# Patient Record
Sex: Female | Born: 1945
Health system: Southern US, Community
[De-identification: ages and names within clinical notes are randomized; demographics above are authoritative.]

## PROBLEM LIST (undated history)

## (undated) DIAGNOSIS — E785 Hyperlipidemia, unspecified: Secondary | ICD-10-CM

## (undated) DIAGNOSIS — R011 Cardiac murmur, unspecified: Secondary | ICD-10-CM

## (undated) DIAGNOSIS — K219 Gastro-esophageal reflux disease without esophagitis: Secondary | ICD-10-CM

## (undated) DIAGNOSIS — J45909 Unspecified asthma, uncomplicated: Secondary | ICD-10-CM

## (undated) DIAGNOSIS — T7840XA Allergy, unspecified, initial encounter: Secondary | ICD-10-CM

## (undated) DIAGNOSIS — R450 Nervousness: Secondary | ICD-10-CM

## (undated) DIAGNOSIS — I10 Essential (primary) hypertension: Secondary | ICD-10-CM

## (undated) DIAGNOSIS — Z9109 Other allergy status, other than to drugs and biological substances: Secondary | ICD-10-CM

## (undated) HISTORY — DX: Allergy, unspecified, initial encounter: T78.40XA

## (undated) HISTORY — DX: Cardiac murmur, unspecified: R01.1

## (undated) HISTORY — DX: Hyperlipidemia, unspecified: E78.5

## (undated) HISTORY — DX: Gastro-esophageal reflux disease without esophagitis: K21.9

## (undated) HISTORY — DX: Unspecified asthma, uncomplicated: J45.909

## (undated) HISTORY — DX: Essential (primary) hypertension: I10

## (undated) HISTORY — PX: ABDOMINAL HYSTERECTOMY: SHX81

## (undated) HISTORY — PX: TONSILLECTOMY: SUR1361

## (undated) HISTORY — DX: Other allergy status, other than to drugs and biological substances: Z91.09

## (undated) HISTORY — DX: Nervousness: R45.0

---

## 2000-04-13 ENCOUNTER — Encounter: Admission: RE | Admit: 2000-04-13 | Discharge: 2000-04-13 | Payer: Self-pay | Admitting: *Deleted

## 2000-04-13 ENCOUNTER — Encounter: Payer: Self-pay | Admitting: *Deleted

## 2000-08-09 ENCOUNTER — Ambulatory Visit (HOSPITAL_COMMUNITY): Admission: RE | Admit: 2000-08-09 | Discharge: 2000-08-09 | Payer: Self-pay | Admitting: *Deleted

## 2001-04-17 ENCOUNTER — Encounter: Payer: Self-pay | Admitting: *Deleted

## 2001-04-17 ENCOUNTER — Encounter: Admission: RE | Admit: 2001-04-17 | Discharge: 2001-04-17 | Payer: Self-pay | Admitting: *Deleted

## 2002-04-18 ENCOUNTER — Encounter: Payer: Self-pay | Admitting: *Deleted

## 2002-04-18 ENCOUNTER — Encounter: Admission: RE | Admit: 2002-04-18 | Discharge: 2002-04-18 | Payer: Self-pay | Admitting: *Deleted

## 2003-03-31 ENCOUNTER — Ambulatory Visit (HOSPITAL_COMMUNITY): Admission: RE | Admit: 2003-03-31 | Discharge: 2003-03-31 | Payer: Self-pay | Admitting: Gastroenterology

## 2003-04-21 ENCOUNTER — Encounter: Payer: Self-pay | Admitting: *Deleted

## 2003-04-21 ENCOUNTER — Encounter: Admission: RE | Admit: 2003-04-21 | Discharge: 2003-04-21 | Payer: Self-pay | Admitting: *Deleted

## 2003-07-07 ENCOUNTER — Other Ambulatory Visit: Admission: RE | Admit: 2003-07-07 | Discharge: 2003-07-07 | Payer: Self-pay | Admitting: Gynecology

## 2004-04-26 ENCOUNTER — Encounter: Admission: RE | Admit: 2004-04-26 | Discharge: 2004-04-26 | Payer: Self-pay | Admitting: Pediatric Nephrology

## 2005-05-26 ENCOUNTER — Encounter: Admission: RE | Admit: 2005-05-26 | Discharge: 2005-05-26 | Payer: Self-pay | Admitting: *Deleted

## 2005-12-08 ENCOUNTER — Ambulatory Visit (HOSPITAL_BASED_OUTPATIENT_CLINIC_OR_DEPARTMENT_OTHER): Admission: RE | Admit: 2005-12-08 | Discharge: 2005-12-08 | Payer: Self-pay | Admitting: Orthopedic Surgery

## 2006-05-29 ENCOUNTER — Encounter: Admission: RE | Admit: 2006-05-29 | Discharge: 2006-05-29 | Payer: Self-pay | Admitting: *Deleted

## 2007-06-05 ENCOUNTER — Encounter: Admission: RE | Admit: 2007-06-05 | Discharge: 2007-06-05 | Payer: Self-pay | Admitting: Family Medicine

## 2007-06-07 ENCOUNTER — Encounter: Admission: RE | Admit: 2007-06-07 | Discharge: 2007-06-07 | Payer: Self-pay | Admitting: Family Medicine

## 2007-12-27 ENCOUNTER — Encounter: Admission: RE | Admit: 2007-12-27 | Discharge: 2007-12-27 | Payer: Self-pay | Admitting: Family Medicine

## 2008-06-05 ENCOUNTER — Encounter: Admission: RE | Admit: 2008-06-05 | Discharge: 2008-06-05 | Payer: Self-pay | Admitting: Family Medicine

## 2008-07-08 ENCOUNTER — Other Ambulatory Visit: Admission: RE | Admit: 2008-07-08 | Discharge: 2008-07-08 | Payer: Self-pay | Admitting: Family Medicine

## 2008-11-21 ENCOUNTER — Ambulatory Visit (HOSPITAL_COMMUNITY): Admission: RE | Admit: 2008-11-21 | Discharge: 2008-11-21 | Payer: Self-pay | Admitting: Family Medicine

## 2008-12-01 ENCOUNTER — Encounter: Admission: RE | Admit: 2008-12-01 | Discharge: 2008-12-01 | Payer: Self-pay | Admitting: Gastroenterology

## 2009-06-08 ENCOUNTER — Encounter: Admission: RE | Admit: 2009-06-08 | Discharge: 2009-06-08 | Payer: Self-pay | Admitting: Family Medicine

## 2009-06-09 ENCOUNTER — Encounter: Admission: RE | Admit: 2009-06-09 | Discharge: 2009-06-09 | Payer: Self-pay | Admitting: Gastroenterology

## 2010-06-09 ENCOUNTER — Encounter: Admission: RE | Admit: 2010-06-09 | Discharge: 2010-06-09 | Payer: Self-pay | Admitting: Family Medicine

## 2010-11-28 ENCOUNTER — Encounter: Payer: Self-pay | Admitting: Family Medicine

## 2010-11-29 ENCOUNTER — Encounter: Payer: Self-pay | Admitting: Family Medicine

## 2010-12-14 ENCOUNTER — Other Ambulatory Visit: Payer: Self-pay | Admitting: Family Medicine

## 2010-12-14 DIAGNOSIS — R2681 Unsteadiness on feet: Secondary | ICD-10-CM

## 2010-12-23 ENCOUNTER — Ambulatory Visit
Admission: RE | Admit: 2010-12-23 | Discharge: 2010-12-23 | Disposition: A | Discharge status: Home / Self Care | Payer: 59 | Source: Ambulatory Visit | Attending: Family Medicine | Admitting: Family Medicine

## 2010-12-23 DIAGNOSIS — R2681 Unsteadiness on feet: Secondary | ICD-10-CM

## 2011-03-25 NOTE — Op Note (Signed)
NAMEMARRI, MCNEFF             ACCOUNT NO.:  [id number]   MEDICAL RECORD NO.:  [id number]          PATIENT TYPE:  AMB   LOCATION:  DSC                          FACILITY:  [facility]   PHYSICIAN:  Katy Fitch. Sypher, M.D. DATE OF BIRTH:  [birth year]   DATE OF PROCEDURE:  12/08/2005  DATE OF DISCHARGE:                                 OPERATIVE REPORT   PREOPERATIVE DIAGNOSIS:  Recurrent mucous cyst formation, ulnar aspect of  left index finger distal interphalangeal joint, status post prior  debridement by Dr. Dorinda Hill, attending dermatologist.   POSTOPERATIVE DIAGNOSIS:  Recurrent mucous cyst formation, ulnar aspect of  left index finger distal interphalangeal joint, status post prior  debridement by Dr. Dorinda Hill, attending dermatologist.   OPERATION:  Debridement of mucous cyst, left index finger distal  interphalangeal joint, with synovectomy and irrigation of left index finger  distal interphalangeal joint.   OPERATING SURGEON:  Katy Fitch. Sypher, M.D.   ASSISTANT:  Molly Maduro Dasnoit, P.A.-C.   ANESTHESIA:  0.25% Marcaine and 2% lidocaine metacarpal head level block of  left index finger supplemented by IV sedation.   SUPERVISING ANESTHESIOLOGIST:  Bedelia Person, M.D.   INDICATIONS:  Noelia Lenart is a 65 year old woman referred through the  courtesy of Dr. Dorinda Hill, attending dermatologist, for evaluation of  chronic recurrent mucous cyst involving her left index finger.   Ms. Glazer has background osteoarthritis with Heberden's nodes affecting  multiple distal interphalangeal joints.   In the spring of 2006, developed an unsightly and uncomfortable mucous cyst.  Dr. Mayford Knife attempted debridement under local anesthesia in his office.   Ms. Ravenscroft had excavation of the cyst followed by delayed primary healing.  For several months, the cyst remained absent; however, she now has developed  a recurrent bubbling area of mucoid fluid on the ulnar dorsal aspect of  her  left index finger DIP joint adjacent to osteophytes at the distal ulnar  aspect of the middle phalangeal condyle and the base of the distal phalanx.   Dr. Mayford Knife recommended a Hand Surgery consult.   After informed consent, Ms. Penado is brought to the operating room at this  time.  Preoperatively, she was counseled that the underlying pathology was  due to the presence of osteoarthritis in her distal interphalangeal joint.  This is evidenced by the development of marginal osteophytes and joint space  narrowing on her x-ray.   While most mucous cysts wall respond to debridement, we cannot guarantee  that they will remain absent, given progressive arthritis conditions.   We have advised her to attempt debridement of the visible cyst as well as  capsulectomy and synovectomy with debridement of the DIP joints.  Typically,  we will remove the marginal osteophytes on the adjacent surfaces of the  middle and distal phalanx and this art has proven successful the majority of  times in the past.   Questions were invited and answered preoperatively.   PROCEDURE:  Macayla Ekdahl was brought to the operating room and placed in  a supine position upon the operating table.   Following light sedation, the left arm was prepped with  Betadine soap and  solution and sterilely draped.  0.25% Marcaine and 2% lidocaine were  infiltrated in the metacarpal head level to obtain a digital block.  After  10 minutes, anesthesia was complete.   The left index finger was exsanguinated with a gauze wrap and a quarter-inch  Penrose drain placed at the proximal phalangeal level as a digital  tourniquet.   Ms. Escamilla cyst was explored through a curvilinear incision that was  ultimately expanded to a Mercedes incision, as there appeared to be a stalk  heading proximally from the visible cyst.   All of the mucin-containing tissues were debrided with a fine rongeur  followed by entry into the DIP joint  between the terminal extensor tendon  slip and the ulnar collateral ligament.  The capsule was excised followed by  use of a Halle-Martini microcurette to remove the adjacent osteophytes.   The hyaline and articular cartilage surfaces of the ulnar condyle of the  middle phalanx were noted to be pot-marked with areas of significant  degenerative change.   A dental needle was used within the joint to provide brisk irrigation under  pressure.  After approximately 40 mL of saline were used to irrigate the  joint, the mucinous areas of the dermis were debrided with a rongeur  followed by closure of the wound with mattress suture of 5-0 nylon.   The wound was dressed with Xeroflo, sterile gauze and Coban.   There were no apparent complications.   Ms. Kruschke tolerated this procedure in satisfactory condition.  She was  transferred to the recovery room with stable signs..   She will be discharged with prescriptions for Vicodin 5 mg one p.o. q.4-6 h.  p.r.n. pain, 20 tablets without refill, also Keflex 500 mg one p.o. q.8 h.  x4 days as a prophylactic antibiotic.      Katy Fitch Sypher, M.D.  Electronically Signed     RVS/MEDQ  D:  12/08/2005  T:  12/08/2005  Job:  [id number]   cc:   Dollene Cleveland, M.D.  Fax: [phone]

## 2011-03-25 NOTE — Procedures (Signed)
. [facility]  Patient:    Melanie Evans, NAM                    MRN: [id number] Proc. Date: 08/09/00 Adm. Date:  [id number] Attending:  Mingo Amber CC:         Al Decant. Janey Greaser, M.D.   Procedure Report  PROCEDURE:  Video upper endoscopy.  INDICATIONS:  A 65 year old female with clinical diagnosis of worsening reflux, with reflux laryngitis.  PREPARATION:  She is NPO since midnight.  PREPROCEDURE SEDATION:  She received 40 mg of Demerol and 6 mg of Versed. Despite this, she became extremely agitated and combative during the procedure.  DESCRIPTION OF PROCEDURE:  The Olympus video upper endoscope was inserted via the mouth and advanced through the upper esophageal sphincter with ease. Intubation was then carried out through the descending duodenum.  On withdrawal the mucosa was carefully evaluated.  The descending duodenum and bulb appeared normal, as did the entire stomach and retroflexed view of the GE junction.  On withdrawal through the  GE junction, the Z-line was sharp and occurred at 40 cm.  There was no obvious hiatal hernia, and there were no signs of esophagitis.  The patient tolerated the procedure well in terms of stable vital signs, despite her agitation.  No biopsies were taken.  There were no noted complications.  She will be observed in recovery for one hour and discharged with a benign abdomen.  IMPRESSION:  Normal upper endoscopy, in the face of clinical reflux.  PLAN:  Continued Nexium 40 mg q.a.m., Reglan 10 mg before large meals and at bedtime.   She is to return to the office in three weeks for reassessment. DD:  08/09/00 TD:  08/09/00 Job: [id number]

## 2011-03-25 NOTE — Op Note (Signed)
   NAME:  Melanie Evans, Melanie Evans                       ACCOUNT NO.:  [id number]   MEDICAL RECORD NO.:  [id number]                   PATIENT TYPE:  AMB   LOCATION:  ENDO                                 FACILITY:  [facility]   PHYSICIAN:  John C. Madilyn Fireman, M.D.                 DATE OF BIRTH:  1946-03-10   DATE OF PROCEDURE:  03/31/2003  DATE OF DISCHARGE:                                 OPERATIVE REPORT   PROCEDURE:  Colonoscopy.   INDICATIONS FOR PROCEDURE:  Colon cancer screening in a 65 year old patient  with no previous recent colon screening.   DESCRIPTION OF PROCEDURE:  The patient was placed in the left lateral  decubitus position then placed on the pulse monitor with continuous low flow  oxygen delivered by nasal cannula. She was sedated with 50 mcg IV fentanyl  and 5 mg IV Versed in addition to the medicine given for the previous EGD.  The Olympus video colonoscope was inserted into the rectum and advanced to  the cecum, confirmed by transillumination at McBurney's point and  visualization of the ileocecal valve and appendiceal orifice. The prep was  good but the colon was somewhat tortuous and redundant and somewhat  difficult to reach. The cecum, ascending, transverse, descending and sigmoid  colon all appeared normal with no masses, polyps, diverticula or other  mucosal abnormalities. The rectum likewise appeared normal and retroflexed  view of the anus revealed no obvious internal hemorrhoids. The colonoscope  was then withdrawn and the patient returned to the recovery room in stable  condition. The patient tolerated the procedure well and there were no  immediate complications.   IMPRESSION:  Basically normal colonoscopy.   PLAN:  Next colon cancer screening/sigmoidoscopy in five years.                                               John C. Madilyn Fireman, M.D.    JCH/MEDQ  D:  03/31/2003  T:  03/31/2003  Job:  [id number]   cc:   Al Decant. Janey Greaser, M.D.  [address]  [place]  [place] [address]  Fax: [phone]

## 2011-03-25 NOTE — Op Note (Signed)
   NAME:  Melanie Evans, Melanie Evans                       ACCOUNT NO.:  [id number]   MEDICAL RECORD NO.:  [id number]                   PATIENT TYPE:  AMB   LOCATION:  ENDO                                 FACILITY:  [facility]   PHYSICIAN:  John C. Madilyn Fireman, M.D.                 DATE OF BIRTH:  07-18-1946   DATE OF PROCEDURE:  03/31/2003  DATE OF DISCHARGE:                                 OPERATIVE REPORT   PROCEDURE:  Esophagogastroduodenoscopy with esophageal dilatation.   INDICATIONS FOR PROCEDURE:  Intermittent solid food dysphagia in a patient  also undergoing screening colonoscopy today.   DESCRIPTION OF PROCEDURE:  The patient was placed in the left lateral  decubitus position then placed on the pulse monitor with continuous low flow  oxygen delivered by nasal cannula. She was sedated with 62.5 mcg IV fentanyl  and 6 mg IV Versed. The Olympus video endoscope was advanced under direct  vision into the oropharynx and esophagus. The esophagus was straight and of  normal caliber with the squamocolumnar line at 38 cm. There was a  questionable subtle lower esophageal ring which was not prominent and there  was on resistance to passage of the scope beyond it. The stomach was entered  and a small amount of liquid secretions were suctioned from the fundus.  Retroflexed view of the cardia was unremarkable. The fundus, body, antrum  and pylorus all appeared normal. The duodenum was entered and both the bulb  and second portion were well inspected and appeared to be within normal  limits. A Savary guidewire was passed through the endoscope channel and the  scope withdrawn. A single 16 mm Savary dilator was passed over the guidewire  with mild to moderate resistance and removed together with the wire and no  blood was seen on withdrawal of the dilator. The patient was the prepared  for previously scheduled colonoscopy. She tolerated the procedure well and  there were no immediate complications.   IMPRESSION:  Questionable subtle lower esophageal ring or stricture.   PLAN:  Advance diet and observe response to dilatation.                                               John C. Madilyn Fireman, M.D.    JCH/MEDQ  D:  03/31/2003  T:  03/31/2003  Job:  [id number]   cc:   Al Decant. Janey Greaser, M.D.  [address]  [place]  [place] [address]  Fax: [phone]

## 2011-04-01 ENCOUNTER — Ambulatory Visit (HOSPITAL_BASED_OUTPATIENT_CLINIC_OR_DEPARTMENT_OTHER)
Admission: RE | Admit: 2011-04-01 | Discharge: 2011-04-01 | Disposition: A | Discharge status: Home / Self Care | Payer: 59 | Source: Ambulatory Visit | Attending: Orthopedic Surgery | Admitting: Orthopedic Surgery

## 2011-04-01 DIAGNOSIS — M24049 Loose body in unspecified finger joint(s): Secondary | ICD-10-CM | POA: Insufficient documentation

## 2011-04-01 DIAGNOSIS — M19049 Primary osteoarthritis, unspecified hand: Secondary | ICD-10-CM | POA: Insufficient documentation

## 2011-04-01 DIAGNOSIS — M6740 Ganglion, unspecified site: Secondary | ICD-10-CM | POA: Insufficient documentation

## 2011-04-07 NOTE — Op Note (Signed)
Melanie Evans, Melanie Evans             ACCOUNT NO.:  [id number]  MEDICAL RECORD NO.:  [id number]            PATIENT TYPE:  LOCATION:                                 FACILITY:  PHYSICIAN:  Melanie Evans, M.D.      DATE OF BIRTH:  DATE OF PROCEDURE:  04/01/2011 DATE OF DISCHARGE:                              OPERATIVE REPORT   PREOPERATIVE DIAGNOSIS:  Painful right long finger distal interphalangeal joint with mucoid cyst dorsal ulnar aspect of distal interphalangeal joint capsule with bone-on-bone arthropathy on plain x- rays.  POSTOPERATIVE DIAGNOSIS:  Painful right long finger distal interphalangeal joint with mucoid cyst dorsal ulnar aspect of distal interphalangeal joint capsule with bone-on-bone arthropathy on plain x- rays.  OPERATION:  Debridement of right long finger DIP joint through dorsoradial and dorsoulnar incisions with removal of multiple loose bodies and synovectomy.  OPERATING SURGEON:  Melanie Fitch. Brenn Deziel, MD  ASSISTANT:  Marveen Reeks Dasnoit, PA  ANESTHESIA:  Lidocaine 2% metacarpal head level block of right long finger.  SUPERVISING ANESTHESIOLOGIST/ANESTHETIST:  Melanie Fitch. Abdulwahab Demelo, MD, this a minor operating room procedure.  INDICATION:  Melanie Evans is a 64 year old woman referred through the courtesy of Dr. Dorinda Hill, dermatologist, for evaluation and management of recurrent mucoid cyst.  Five years ago, she was treated for a cyst involving her left hand.  She has had good long-term result. She returned requesting debridement of her right long finger DIP joint where she had a painful enlarging mucoid cyst.  X-rays obtained preoperatively demonstrated bone-on-bone arthropathy with the appearance of probable multiple loose bodies.  After informed consent, she was brought to the operating room at this time.  Melanie Evans was brought to room one of the [facility] and placed in supine position on the operating table.  Following a  routine surgical time-out, the right arm was prepped with Betadine soap and solution and sterilely draped.  A Penrose drain will be used as a digital tourniquet.  Following sterile drape application, the right long finger was exsanguinated with the gauze wrap and a 1/2-inch Penrose drain placed on the proximal phalangeal segment as a digital tourniquet.  Procedure commenced with curvilinear incisions on the dorsoradial and dorsoulnar aspect of the finger.  The capsule between the terminal extensor tendon slip and the radial collateral ligament and ulnar collateral ligament was resected followed by thorough synovectomy of the dorsal aspect of the DIP joint.  More than four loose bodies were removed as well as extensive inflammatory synovium.  The marginal osteophytes at the base of the distal phalanx were resected with a microrongeur.  The wound was then irrigated with a 19-gauge blunt dental needle.  The wound was inspected for hemostasis followed by repair of the incisions with trauma sutures of 5-0 nylon.  There were no apparent complications.  For aftercare, Melanie Evans was provided prescriptions for Vicodin 5 mg one p.o. q.4-6 h. p.r.n. pain, 20 tablets without refill, also doxycycline 100 mg p.o. q.12 h. x4 days as prophylactic antibiotic.     Melanie Fitch Treasa Bradshaw, M.D.     RVS/MEDQ  D:  04/01/2011  T:  04/01/2011  Job:  [id number]  Electronically Signed by Josephine Igo M.D. on 04/07/2011 02:34:38 PM

## 2011-04-14 ENCOUNTER — Other Ambulatory Visit: Payer: Self-pay | Admitting: Family Medicine

## 2011-04-14 DIAGNOSIS — Z1231 Encounter for screening mammogram for malignant neoplasm of breast: Secondary | ICD-10-CM

## 2011-06-13 ENCOUNTER — Ambulatory Visit
Admission: RE | Admit: 2011-06-13 | Discharge: 2011-06-13 | Disposition: A | Discharge status: Home / Self Care | Payer: 59 | Source: Ambulatory Visit | Attending: Family Medicine | Admitting: Family Medicine

## 2011-06-13 DIAGNOSIS — Z1231 Encounter for screening mammogram for malignant neoplasm of breast: Secondary | ICD-10-CM

## 2011-12-13 ENCOUNTER — Other Ambulatory Visit: Payer: Self-pay | Admitting: Gynecology

## 2011-12-13 DIAGNOSIS — N63 Unspecified lump in unspecified breast: Secondary | ICD-10-CM

## 2011-12-13 DIAGNOSIS — N644 Mastodynia: Secondary | ICD-10-CM

## 2011-12-20 ENCOUNTER — Ambulatory Visit
Admission: RE | Admit: 2011-12-20 | Discharge: 2011-12-20 | Disposition: A | Discharge status: Home / Self Care | Payer: 59 | Source: Ambulatory Visit | Attending: Gynecology | Admitting: Gynecology

## 2011-12-20 DIAGNOSIS — N644 Mastodynia: Secondary | ICD-10-CM

## 2011-12-20 DIAGNOSIS — N63 Unspecified lump in unspecified breast: Secondary | ICD-10-CM

## 2012-10-15 ENCOUNTER — Encounter: Payer: Self-pay | Admitting: Internal Medicine

## 2012-10-15 ENCOUNTER — Ambulatory Visit (INDEPENDENT_AMBULATORY_CARE_PROVIDER_SITE_OTHER): Payer: 59 | Admitting: Internal Medicine

## 2012-10-15 VITALS — BP 130/78 | HR 74 | Ht 67.0 in | Wt 172.8 lb

## 2012-10-15 DIAGNOSIS — F5104 Psychophysiologic insomnia: Secondary | ICD-10-CM

## 2012-10-15 DIAGNOSIS — J309 Allergic rhinitis, unspecified: Secondary | ICD-10-CM

## 2012-10-15 DIAGNOSIS — G4700 Insomnia, unspecified: Secondary | ICD-10-CM

## 2012-10-15 MED ORDER — ESZOPICLONE 2 MG PO TABS: 2.0000 mg | ORAL_TABLET | Freq: Every day | ORAL | Status: DC

## 2012-10-15 NOTE — Patient Instructions (Addendum)
Most people sleep best in a cool, quiet, dark room  Try minimizing antihistamines near bedtime, since they seem stimulating to you. You can take the Xyzal in th morning.  Instead of Astelin, try otc Nasalcrom/ Cromol nose spray   Order-  [facility] refer to Behavioral Health for insomnia  Samples Lunesta 2 mg      Try 1 each night at bedtime instead of temazepam  Script to hold if helpfu  Consider looking for a book-  "I Can't Sleep"  By [employer]

## 2012-10-15 NOTE — Progress Notes (Signed)
10/15/12- 37 yoF former smoker referred by Dr Tenny Craw- Insomnia-  Dr Tenny Craw; wants to come off of Temazepam if possible She dates onset of sleep problems to 6 years ago when she fell, fracturing left fingers. She developed reflex sympathetic dystrophy treated with medications including pain medications. She says she slept fine until then. . Since then she has difficulty sleeping without taking medication and feels dependent on temazepam to get any sleep at all. Previously failed trazodone and doxepin. Benadryl doesn't make her sleepy. She always feels "revved up" and is sensitive to stimulants. Thyroid has tested normal. Bedtime 10 to 10:30 PM. Sleep latency was temazepam 15 minutes. Wakes 2 or 3 times during the night before finally up at 8:30 AM. Nocturnal waking attributed to being too hot or too cold, bathroom, and she is quite bothered by outdoor light at her neighbors home coming through the window. She has lost 10 or 12 pounds in the last 2 years. Denies snoring and says sleep apnea not likely. History of deviated septum repair, seasonal allergy and asthma. She smoked only briefly in high school. Rare alcohol. She is married, retired, living with husband. 3 children. Allergy vaccine management by Dr. Gardiner Callas.  Prior to Admission medications   Medication Sig Start Date End Date Taking? Authorizing Provider  ALVESCO 80 MCG/ACT inhaler Inhale 1 puff into the lungs Twice daily. 09/20/12  Yes Historical Provider, MD  ASTELIN 137 MCG/SPRAY nasal spray  10/12/12  Yes Historical Provider, MD  Cholecalciferol (VITAMIN D-3) 5000 UNITS TABS Take 1 tablet by mouth daily.   Yes Historical Provider, MD  Coenzyme Q10 (COQ10) 200 MG CAPS Take 1 capsule by mouth daily.   Yes Historical Provider, MD  EPIPEN 2-PAK 0.3 MG/0.3ML DEVI  10/08/12  Yes Historical Provider, MD  Omega-3 Fatty Acids (FISH OIL) 500 MG CAPS Take 1 capsule by mouth daily.   Yes Historical Provider, MD  omeprazole (PRILOSEC) 40 MG capsule Take 1  capsule by mouth daily. 09/10/12  Yes Historical Provider, MD  SINGULAIR 10 MG tablet Take 1 tablet by mouth daily. 09/21/12  Yes Historical Provider, MD  temazepam (RESTORIL) 15 MG capsule Take 1 tablet by mouth At bedtime as needed. 10/05/12  Yes Historical Provider, MD  XYZAL 5 MG tablet Take 1 tablet by mouth daily. 09/21/12  Yes Historical Provider, MD  eszopiclone (LUNESTA) 2 MG TABS Take 1 tablet (2 mg total) by mouth at bedtime. Take immediately before bedtime 10/15/12 10/15/13  Waymon Budge, MD   Past Medical History  Diagnosis Date  . Asthma   . Environmental allergies   . Nervous disorder    History reviewed. No pertinent past surgical history. Family History  Problem Relation Age of Onset  . Lung cancer Mother   . Asthma Mother   . Allergies Mother   . Cancer - Other    . Ovarian cancer     History   Social History  . Marital Status: Married    Spouse Name: N/A    Number of Children: 3  . Years of Education: N/A   Occupational History  . retired    Social History Main Topics  . Smoking status: Former Smoker -- 1 years    Types: Cigarettes  . Smokeless tobacco: Not on file  . Alcohol Use: Yes     Comment: rare-wine  . Drug Use: No  . Sexually Active: Not on file   Other Topics Concern  . Not on file   Social History Narrative  .  No narrative on file   ROS-see HPI Constitutional:   No-   weight loss, night sweats, fevers, chills, fatigue, lassitude. HEENT:   No-  headaches, difficulty swallowing, tooth/dental problems, sore throat,       No-  sneezing, itching, ear ache, nasal congestion, post nasal drip,  CV:  No-   chest pain, orthopnea, PND, swelling in lower extremities, anasarca,  dizziness, palpitations Resp: No-   shortness of breath with exertion or at rest.              No-   productive cough,  No non-productive cough,  No- coughing up of blood.              No-   change in color of mucus.  No- wheezing.   Skin: No-   rash or lesions. GI:   No-   heartburn, indigestion, abdominal pain, nausea, vomiting,  GU:  MS:  No-   joint pain or swelling.   Neuro-     nothing unusual Psych:  No- change in mood or affect. No depression or anxiety.  No memory loss.  OBJ- Physical Exam General- Alert, Oriented, Affect-appropriate, Distress- none acute. Appears well. Skin- rash-none, lesions- none, excoriation- none Lymphadenopathy- none Head- atraumatic            Eyes- Gross vision intact, PERRLA, conjunctivae and secretions clear            Ears- Hearing, canals-normal            Nose- Clear, no-Septal dev, mucus, polyps, erosion, perforation             Throat- Mallampati II , mucosa clear , drainage- none, tonsils- atrophic Neck- flexible , trachea midline, no stridor , thyroid nl, carotid no bruit Chest - symmetrical excursion , unlabored           Heart/CV- RRR , no murmur , no gallop  , no rub, nl s1 s2                           - JVD- none , edema- none, stasis changes- none, varices- none           Lung- clear to P&A, wheeze- none, cough- none , dullness-none, rub- none           Chest wall-  Abd- not obese, not distended. Br/ Gen/ Rectal- Not done, not indicated Extrem- cyanosis- none, clubbing, none, atrophy- none, strength- nl Neuro- grossly intact to observation

## 2012-10-21 DIAGNOSIS — F5104 Psychophysiologic insomnia: Secondary | ICD-10-CM | POA: Insufficient documentation

## 2012-10-21 DIAGNOSIS — J302 Other seasonal allergic rhinitis: Secondary | ICD-10-CM | POA: Insufficient documentation

## 2012-10-21 NOTE — Assessment & Plan Note (Signed)
Dr. [place] Callas we'll manage this problem. In an effort to reduce antihistamine use, I suggested she try changing her nasal spray to Nasalcrom while we watch to see if idiosyncratic antihistamine stimulation is contributing to her insomnia.

## 2012-10-21 NOTE — Assessment & Plan Note (Signed)
Is not clear if she is now physically dependent on temazepam but there is certainly an emotional dependence. Plan-try shifting pain medication first to something in a different class-Lunesta. She finds antihistamines stimulating so we discussed how to reduce that exposure especially near bedtime. She agrees to behavioral health referral for exploration of non-pharmaceutical assistance with her sleep.

## 2012-10-23 ENCOUNTER — Ambulatory Visit (INDEPENDENT_AMBULATORY_CARE_PROVIDER_SITE_OTHER): Payer: 59 | Admitting: Professional

## 2012-10-23 DIAGNOSIS — F411 Generalized anxiety disorder: Secondary | ICD-10-CM

## 2012-11-27 ENCOUNTER — Ambulatory Visit (INDEPENDENT_AMBULATORY_CARE_PROVIDER_SITE_OTHER): Payer: 59 | Admitting: Internal Medicine

## 2012-11-27 ENCOUNTER — Encounter: Payer: Self-pay | Admitting: Internal Medicine

## 2012-11-27 VITALS — BP 142/76 | HR 78 | Ht 67.0 in | Wt 170.6 lb

## 2012-11-27 DIAGNOSIS — G4700 Insomnia, unspecified: Secondary | ICD-10-CM

## 2012-11-27 DIAGNOSIS — F5104 Psychophysiologic insomnia: Secondary | ICD-10-CM

## 2012-11-27 NOTE — Progress Notes (Signed)
10/15/12- 61 yoF former smoker referred by Dr Tenny Craw- Insomnia-  Dr Tenny Craw; wants to come off of Temazepam if possible She dates onset of sleep problems to 6 years ago when she fell, fracturing left fingers. She developed reflex sympathetic dystrophy treated with medications including pain medications. She says she slept fine until then. . Since then she has difficulty sleeping without taking medication and feels dependent on temazepam to get any sleep at all. Previously failed trazodone and doxepin. Benadryl doesn't make her sleepy. She always feels "revved up" and is sensitive to stimulants. Thyroid has tested normal. Bedtime 10 to 10:30 PM. Sleep latency was temazepam 15 minutes. Wakes 2 or 3 times during the night before finally up at 8:30 AM. Nocturnal waking attributed to being too hot or too cold, bathroom, and she is quite bothered by outdoor light at her neighbors home coming through the window. She has lost 10 or 12 pounds in the last 2 years. Denies snoring and says sleep apnea not likely. History of deviated septum repair, seasonal allergy and asthma. She smoked only briefly in high school. Rare alcohol. She is married, retired, living with husband. 3 children. Allergy vaccine management by Dr. [place] Callas.  11/27/12- 87 yoF former smoker referred by Dr Tenny Craw- Insomnia since fall with hand injury/reflex sympathetic dystrophy Follows For: Sleeping better with [facility] but has been unable to do without it due to lack of bedtime routine in December and January She can't relax when her husband is around the house. 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 This changes there living schedules which she finds disruptive of her sleep. Alfonso Patten works well. We discussed alternatives including what to expect from melatonin and relaxation therapies such as yoga.  ROS-see HPI Constitutional:   No-   weight loss, night sweats, fevers, chills, fatigue, lassitude. HEENT:   No-  headaches, difficulty swallowing,  tooth/dental problems, sore throat,       No-  sneezing, itching, ear ache, nasal congestion, post nasal drip,  CV:  No-   chest pain, orthopnea, PND, swelling in lower extremities, anasarca,  dizziness, palpitations Resp: No-   shortness of breath with exertion or at rest.              No-   productive cough,  No non-productive cough,  No- coughing up of blood.              No-   change in color of mucus.  No- wheezing.   Skin: No-   rash or lesions. GI:  No-   heartburn, indigestion, abdominal pain, nausea, vomiting,  GU:  MS:  No-   joint pain or swelling.   Neuro-     nothing unusual Psych:  No- change in mood or affect. No depression or anxiety.  No memory loss.  OBJ- Physical Exam General- Alert, Oriented, Affect-appropriate, Distress- none acute. Appears well. Skin- rash-none, lesions- none, excoriation- none Lymphadenopathy- none Head- atraumatic            Eyes- Gross vision intact, PERRLA, conjunctivae and secretions clear            Ears- Hearing, canals-normal            Nose- Clear, no-Septal dev, mucus, polyps, erosion, perforation             Throat- Mallampati II , mucosa clear , drainage- none, tonsils- atrophic Neck- flexible , trachea midline, no stridor , thyroid nl, carotid no bruit Chest - symmetrical excursion , unlabored  Heart/CV- RRR , no murmur , no gallop  , no rub, nl s1 s2                           - JVD- none , edema- none, stasis changes- none, varices- none           Lung- clear to P&A, wheeze- none, cough- none , dullness-none, rub- none           Chest wall-  Abd-  Br/ Gen/ Rectal- Not done, not indicated Extrem- cyanosis- none, clubbing, none, atrophy- none, strength- nl Neuro- grossly intact to observation

## 2012-11-27 NOTE — Patient Instructions (Addendum)
Ok to continue [place] as discussed  You might like a book "I Can't Sleep"  By [employer]

## 2012-12-08 NOTE — Assessment & Plan Note (Signed)
We can continue Lunesta but add melatonin to support circadian rhythm and add yoga to help towards relaxation therapy. I've also introduced her to the "I Can't Sleep" book.

## 2012-12-17 ENCOUNTER — Other Ambulatory Visit: Payer: Self-pay | Admitting: Obstetrics and Gynecology

## 2012-12-17 ENCOUNTER — Telehealth: Payer: Self-pay | Admitting: Internal Medicine

## 2012-12-17 DIAGNOSIS — Z1231 Encounter for screening mammogram for malignant neoplasm of breast: Secondary | ICD-10-CM

## 2012-12-17 NOTE — Telephone Encounter (Signed)
LMTCB

## 2012-12-17 NOTE — Telephone Encounter (Signed)
She can try taking existing supply of Lunesta 2 mg, 2 per night, which is 4 mg, for 3 nights only.  I would like to refer her to Dr Dellia Cloud in [facility] for problem of anxiety with insomnia

## 2012-12-17 NOTE — Telephone Encounter (Signed)
lmomtcb x1 for pt 

## 2012-12-17 NOTE — Telephone Encounter (Signed)
Spoke with pt She states that the lunesta 2 mg is no longer working for her for the past wk She states despite taking med, totally eliminating caffeine, and doing relaxing things before bed she can not sleep She has not slept for the past 4 nights Please advise recs thanks Last ov 11/27/12 Next ov 06/11/13 Allergies  Allergen Reactions  . Cefzil (Cefprozil)   . Erythromycin   . Sulfa Antibiotics

## 2012-12-17 NOTE — Telephone Encounter (Signed)
Pt called back & asked to be reached on her home phone-[phone].  Melanie Evans

## 2012-12-18 NOTE — Telephone Encounter (Signed)
Spoke with pt  Notified of recs per CDY She states will try taking the lunesta 2 tablets at hs and see if this helps She states wants to hold off on the referral to Dr. Dellia Cloud since she has seen him in the past and continues to do the behavioral techniques that he rec with no relief I have asked her to call us by end of wk to let us know how she is doing and will go from there

## 2012-12-18 NOTE — Telephone Encounter (Signed)
Pt returned Katie's call & can be reached at [phone].  Please call pt's home number, not her cell number.  Antionette Fairy

## 2012-12-19 ENCOUNTER — Telehealth: Payer: Self-pay | Admitting: Internal Medicine

## 2012-12-19 MED ORDER — ESZOPICLONE 2 MG PO TABS: 4.0000 mg | ORAL_TABLET | Freq: Every evening | ORAL | Status: DC | PRN

## 2012-12-19 NOTE — Telephone Encounter (Signed)
Per CY- okay to give RX Lunesta 2mg  #30 take 2 tabs by mouth qhs prn sleep with 5 refills; please make sure patient understands that her insurance may not allow this QTY and she will need to pay out of pocket for additional tabs not covered under her insurance. We will NOT do a PA for this per CY.

## 2012-12-19 NOTE — Telephone Encounter (Signed)
Spoke with pt and notified of recs per CDY She verbalized understanding and states nothing further needed Rx was called to pharm   

## 2012-12-19 NOTE — Telephone Encounter (Signed)
Spoke with pt  She states that since started taking lunesta 2 mg 2 at hs helps a lot She states that she is needing to have rx called in before the bad weather comes b/c only has 2 tablets left Please advise thanks! Last ov 11/27/12 Next ov 06/11/13

## 2013-01-08 ENCOUNTER — Ambulatory Visit: Payer: Self-pay

## 2013-01-10 ENCOUNTER — Ambulatory Visit
Admission: RE | Admit: 2013-01-10 | Discharge: 2013-01-10 | Disposition: A | Discharge status: Home / Self Care | Payer: 59 | Source: Ambulatory Visit | Attending: Obstetrics and Gynecology | Admitting: Obstetrics and Gynecology

## 2013-01-10 DIAGNOSIS — Z1231 Encounter for screening mammogram for malignant neoplasm of breast: Secondary | ICD-10-CM

## 2013-01-14 ENCOUNTER — Other Ambulatory Visit: Payer: Self-pay | Admitting: Obstetrics and Gynecology

## 2013-01-15 ENCOUNTER — Telehealth: Payer: Self-pay | Admitting: Critical Care Medicine

## 2013-01-15 ENCOUNTER — Telehealth: Payer: Self-pay | Admitting: Internal Medicine

## 2013-01-15 NOTE — Telephone Encounter (Signed)
Error.Melanie Evans ° °

## 2013-01-16 ENCOUNTER — Telehealth: Payer: Self-pay | Admitting: Internal Medicine

## 2013-01-16 NOTE — Telephone Encounter (Signed)
Duplicate phone msg has been taken on this.  Pls see phone msg from 01/16/13 for additional information.

## 2013-01-16 NOTE — Telephone Encounter (Signed)
We can proceed with PA effort for lunesta, based on having failed the previous meds you listed.

## 2013-01-16 NOTE — Telephone Encounter (Signed)
Called, spoke with pt.  States she is having trouble getting lunesta filled from [place]. [employer], spoke with [place].  Was advised a PA is needed.  We do have this form.  I have printed PA off through cover my meds.  I have given this to [facility]  Pt states that Alfonso Patten is working.  States she has tried trazodone, temazepam, and 2-3 other medications.  She is aware we are working on this.  Dr. Maple Hudson, pls advise if you would like to proceed with PA or if you would like to change medication.  Thank you.

## 2013-01-18 NOTE — Telephone Encounter (Signed)
Pt states that she needs this medication renewal taken care of today.  Pt states that she is getting very frustrated b/c she this hasn't been taken care of, she hasn't been contacted by our office & it has now been 3 days she has been w/out meds.  Please call pt asap at [phone].  Pt can also be reached at [phone].  Antionette Fairy

## 2013-01-18 NOTE — Telephone Encounter (Signed)
I advised pt we are working on PA and will keep her updated. Please advise Melanie Evans thanks

## 2013-01-21 MED ORDER — ESZOPICLONE 2 MG PO TABS: 4.0000 mg | ORAL_TABLET | Freq: Every evening | ORAL | Status: DC | PRN

## 2013-01-21 NOTE — Telephone Encounter (Signed)
Unable to tell if PA has been done/initiated PA initiated thru [email or web address] > form e-faxed and printed, placed on Katie's desk Per CY, ok for lunesta 2mg  samples x5 Samples documented per protocol and placed up front LMOM TCB at [phone]

## 2013-01-21 NOTE — Telephone Encounter (Addendum)
Pt has called back to check on the status of this PA for her lunesta.  Pt would like samples if possible, but will not be able to p/u today.   Melanie Evans

## 2013-01-21 NOTE — Telephone Encounter (Signed)
Katie, please advise on PA status or if it is okay with CDY to give samples, thanks

## 2013-01-22 ENCOUNTER — Telehealth: Payer: Self-pay | Admitting: Internal Medicine

## 2013-01-22 NOTE — Telephone Encounter (Signed)
Called, spoke with pt.  Advised lunesta 2 mg samples at front for pick up and advised we have initiated PT for lunesta.  She is aware we will call her back once we hear from an approval or denial.  Will route msg to Katie to f/u on PA.

## 2013-01-22 NOTE — Telephone Encounter (Signed)
Error.Melanie Evans ° °

## 2013-01-28 NOTE — Telephone Encounter (Signed)
I called [facility] to check on status of PA and they did not show one on file so I re-initiated that PA and asked for fax to be sent. I will complete form once fax received and give to [facility]. Carron Curie, CMA

## 2013-01-29 NOTE — Telephone Encounter (Signed)
I just got denial from [employer] late yesterday afternoon. Will forward message and papers to CY to advise.

## 2013-01-29 NOTE — Telephone Encounter (Signed)
Spoke with patient. Patient made aware of recs as listed below per Dr. Maple Hudson, however patient states she really does not want to try Ambien. States her daughter had a bad reaction from this and she thinks it would be "crazy" for her to even try it. And lunesta is to expensive to self pay. Patient would like to know if Dr. Maple Hudson has any other suggestions besides Ambien. Per patient she does not understand why she was taken off temazepam because it was habit forming when Ambien is also habit forming. I have asked patient if she has asked for her drug formulary from her insurance company to see what other meds would be covered, she stated no but says she does remember seeing Zoloft on the letter they sent to her home. Pt requesting further recs from Dr. Maple Hudson because at this point she is a "walking zombie" Dr. Maple Hudson please advise, thank you

## 2013-01-29 NOTE — Telephone Encounter (Signed)
Insurance denied Lunesta. They will pay for ambien 10 mg and we can order that if she wants # 30, 1 for sleep if needed, refill x 5. Or she can self pay for Lunesta 2 mg, # 30, 1 for sleep if needed, ref x 5.

## 2013-01-30 MED ORDER — ZALEPLON 10 MG PO CAPS: 10.0000 mg | ORAL_CAPSULE | Freq: Every evening | ORAL | Status: DC | PRN

## 2013-01-30 NOTE — Telephone Encounter (Signed)
The other one her insurance covers is [employer] 10 mg # 30, 1 at bedtime for sleep if needed, ref x 5

## 2013-01-30 NOTE — Telephone Encounter (Signed)
Called and spoke with pt and notified of recs per CDY She verbalized understanding Nothing further needed Rx was called to pharm

## 2013-02-07 ENCOUNTER — Other Ambulatory Visit: Payer: Self-pay | Admitting: Internal Medicine

## 2013-02-07 NOTE — Telephone Encounter (Signed)
Pharmacy sent over a request for a prior auth on lunesta. Per last telephone  Note pt was changed to sonato.pharmacy aware nothing further needed.

## 2013-05-28 ENCOUNTER — Other Ambulatory Visit: Payer: Self-pay | Admitting: Gastroenterology

## 2013-05-28 DIAGNOSIS — R109 Unspecified abdominal pain: Secondary | ICD-10-CM

## 2013-05-28 DIAGNOSIS — R112 Nausea with vomiting, unspecified: Secondary | ICD-10-CM

## 2013-05-29 ENCOUNTER — Ambulatory Visit
Admission: RE | Admit: 2013-05-29 | Discharge: 2013-05-29 | Disposition: A | Discharge status: Home / Self Care | Payer: 59 | Source: Ambulatory Visit | Attending: Gastroenterology | Admitting: Gastroenterology

## 2013-05-29 ENCOUNTER — Other Ambulatory Visit: Payer: Self-pay

## 2013-05-29 DIAGNOSIS — R112 Nausea with vomiting, unspecified: Secondary | ICD-10-CM

## 2013-05-29 DIAGNOSIS — R109 Unspecified abdominal pain: Secondary | ICD-10-CM

## 2013-05-29 MED ORDER — IOHEXOL 300 MG/ML  SOLN
100.0000 mL | Freq: Once | INTRAMUSCULAR | Status: AC | PRN
  Administered 2013-05-29: 100 mL via INTRAVENOUS

## 2013-06-11 ENCOUNTER — Encounter: Payer: Self-pay | Admitting: Internal Medicine

## 2013-06-11 ENCOUNTER — Ambulatory Visit (INDEPENDENT_AMBULATORY_CARE_PROVIDER_SITE_OTHER): Payer: 59 | Admitting: Internal Medicine

## 2013-06-11 VITALS — BP 150/88 | HR 76 | Ht 67.0 in | Wt 166.0 lb

## 2013-06-11 DIAGNOSIS — F5104 Psychophysiologic insomnia: Secondary | ICD-10-CM

## 2013-06-11 DIAGNOSIS — G4700 Insomnia, unspecified: Secondary | ICD-10-CM

## 2013-06-11 MED ORDER — ZALEPLON 10 MG PO CAPS: 10.0000 mg | ORAL_CAPSULE | Freq: Every evening | ORAL | Status: DC | PRN

## 2013-06-11 NOTE — Patient Instructions (Addendum)
Script refilled for sonata 10 mg  We can change to try 5 mg sonata later  Try adding benadryl/ diphenhydramine 25 mg at bedtime, or later during the night if you need more than the Sonata.

## 2013-06-11 NOTE — Progress Notes (Signed)
10/15/12- 33 yoF former smoker referred by Dr Tenny Craw- Insomnia-  Dr Tenny Craw; wants to come off of Temazepam if possible She dates onset of sleep problems to 6 years ago when she fell, fracturing left fingers. She developed reflex sympathetic dystrophy treated with medications including pain medications. She says she slept fine until then. . Since then she has difficulty sleeping without taking medication and feels dependent on temazepam to get any sleep at all. Previously failed trazodone and doxepin. Benadryl doesn't make her sleepy. She always feels "revved up" and is sensitive to stimulants. Thyroid has tested normal. Bedtime 10 to 10:30 PM. Sleep latency was temazepam 15 minutes. Wakes 2 or 3 times during the night before finally up at 8:30 AM. Nocturnal waking attributed to being too hot or too cold, bathroom, and she is quite bothered by outdoor light at her neighbors home coming through the window. She has lost 10 or 12 pounds in the last 2 years. Denies snoring and says sleep apnea not likely. History of deviated septum repair, seasonal allergy and asthma. She smoked only briefly in high school. Rare alcohol. She is married, retired, living with husband. 3 children. Allergy vaccine management by Dr. [place] Callas.  11/27/12- 61 yoF former smoker referred by Dr Tenny Craw- Insomnia since fall with hand injury/reflex sympathetic dystrophy Follows For: Sleeping better with [facility] but has been unable to do without it due to lack of bedtime routine in December and January She can't relax when her husband is around the house. 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 This changes there living schedules which she finds disruptive of her sleep. Alfonso Patten works well. We discussed alternatives including what to expect from melatonin and relaxation therapies such as yoga.  06/11/13-66 yoF former smoker referred by Dr Tenny Craw- Insomnia since fall with hand injury/reflex sympathetic dystrophy FOLLOWS FOR: has good and bad  nights even with medicine. Had a rough night; her body will not let her rest. Of Lunesta, using Sonata. Chiropractor for adjustment of neck to avoid headaches which bothered her sleep.  ROS-see HPI Constitutional:   No-   weight loss, night sweats, fevers, chills, fatigue, lassitude. HEENT:   + headaches, no-difficulty swallowing, tooth/dental problems, sore throat,       No-  sneezing, itching, ear ache, nasal congestion, post nasal drip,  CV:  No-   chest pain, orthopnea, PND, swelling in lower extremities, anasarca,  dizziness, palpitations Resp: No-   shortness of breath with exertion or at rest.              No-   productive cough,  No non-productive cough,  No- coughing up of blood.              No-   change in color of mucus.  No- wheezing.   Skin: No-   rash or lesions. GI:  No-   heartburn, indigestion, abdominal pain, nausea, vomiting,  GU:  MS:  No-   joint pain or swelling.   Neuro-     nothing unusual Psych:  No- change in mood or affect. No depression or anxiety.  No memory loss.  OBJ- Physical Exam General- Alert, Oriented, Affect-appropriate, Distress- none acute. Appears well. +pressured speech Skin- rash-none, lesions- none, excoriation- none Lymphadenopathy- none Head- atraumatic            Eyes- Gross vision intact, PERRLA, conjunctivae and secretions clear            Ears- Hearing, canals-normal  Nose- Clear, no-Septal dev, mucus, polyps, erosion, perforation             Throat- Mallampati II , mucosa clear , drainage- none, tonsils- atrophic Neck- flexible , trachea midline, no stridor , thyroid nl, carotid no bruit Chest - symmetrical excursion , unlabored           Heart/CV- RRR , no murmur , no gallop  , no rub, nl s1 s2                           - JVD- none , edema- none, stasis changes- none, varices- none           Lung- clear to P&A, wheeze- none, cough- none , dullness-none, rub- none           Chest wall-  Abd-  Br/ Gen/ Rectal- Not done, not  indicated Extrem- cyanosis- none, clubbing, none, atrophy- none, strength- nl Neuro- grossly intact to observation

## 2013-12-12 ENCOUNTER — Encounter: Payer: Self-pay | Admitting: Internal Medicine

## 2013-12-12 ENCOUNTER — Ambulatory Visit (INDEPENDENT_AMBULATORY_CARE_PROVIDER_SITE_OTHER): Payer: 59 | Admitting: Internal Medicine

## 2013-12-12 VITALS — BP 140/60 | HR 78 | Ht 67.0 in | Wt 167.2 lb

## 2013-12-12 DIAGNOSIS — G4700 Insomnia, unspecified: Secondary | ICD-10-CM

## 2013-12-12 DIAGNOSIS — F5104 Psychophysiologic insomnia: Secondary | ICD-10-CM

## 2013-12-12 MED ORDER — ZALEPLON 10 MG PO CAPS: 10.0000 mg | ORAL_CAPSULE | Freq: Every evening | ORAL | Status: DC | PRN

## 2013-12-12 NOTE — Patient Instructions (Signed)
We can continue present meds  Sonata refilled for use as needed

## 2013-12-12 NOTE — Progress Notes (Signed)
10/15/12- 3 yoF former smoker referred by Dr Melanie Evans- Insomnia-  Dr Melanie Evans; wants to come off of Temazepam if possible She dates onset of sleep problems to 6 years ago when she fell, fracturing left fingers. She developed reflex sympathetic dystrophy treated with medications including pain medications. She says she slept fine until then. . Since then she has difficulty sleeping without taking medication and feels dependent on temazepam to get any sleep at all. Previously failed trazodone and doxepin. Benadryl doesn't make her sleepy. She always feels "revved up" and is sensitive to stimulants. Thyroid has tested normal. Bedtime 10 to 10:30 PM. Sleep latency was temazepam 15 minutes. Wakes 2 or 3 times during the night before finally up at 8:30 AM. Nocturnal waking attributed to being too hot or too cold, bathroom, and she is quite bothered by outdoor light at her neighbors home coming through the window. She has lost 10 or 12 pounds in the last 2 years. Denies snoring and says sleep apnea not likely. History of deviated septum repair, seasonal allergy and asthma. She smoked only briefly in high school. Rare alcohol. She is married, retired, living with husband. 3 children. Allergy vaccine management by Dr. Donneta Evans.  11/27/12- 10 yoF former smoker referred by Dr Melanie Evans- Insomnia since fall with hand injury/reflex sympathetic dystrophy Follows For: Sleeping better with [facility] but has been unable to do without it due to lack of bedtime routine in December and January She can't relax when her husband is around the house. 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 This changes there living schedules which she finds disruptive of her sleep. Melanie Evans works well. We discussed alternatives including what to expect from melatonin and relaxation therapies such as yoga.  06/11/13-66 yoF former smoker referred by Dr Melanie Evans- Insomnia since fall with hand injury/reflex sympathetic dystrophy FOLLOWS FOR: has good and bad  nights even with medicine. Had a rough night; her body will not let her rest. Off Lunesta, using Sonata. Chiropractor for adjustment of neck to avoid headaches which bothered her sleep.  12/12/13- 25 yoF former smoker referred by Dr Melanie Evans- Insomnia since fall with hand injury/reflex sympathetic dystrophy Follows For: Sleep is doing better - Taking Sonata as needed, about twice a week. Occasional difficulty relaxing but seems to be doing substantially better than in the past. Uses a chiropractor and is following a gluten-free, sugar-free, nondairy diet which she associates with less reflux.  ROS-see HPI Constitutional:   No-   weight loss, night sweats, fevers, chills, fatigue, lassitude. HEENT:   + headaches, no-difficulty swallowing, tooth/dental problems, sore throat,       No-  sneezing, itching, ear ache, nasal congestion, post nasal drip,  CV:  No-   chest pain, orthopnea, PND, swelling in lower extremities, anasarca,  dizziness, palpitations Resp: No-   shortness of breath with exertion or at rest.              No-   productive cough,  No non-productive cough,  No- coughing up of blood.              No-   change in color of mucus.  No- wheezing.   Skin: No-   rash or lesions. GI:  No-   heartburn, indigestion, abdominal pain, nausea, vomiting,  GU:  MS:  No-   joint pain or swelling.   Neuro-     nothing unusual Psych:  No- change in mood or affect. No depression or anxiety.  No memory loss.  OBJ- Physical  Exam General- Alert, Oriented, Affect-appropriate, Distress- none acute. Appears well.  Skin- rash-none, lesions- none, excoriation- none Lymphadenopathy- none Head- atraumatic            Eyes- Gross vision intact, PERRLA, conjunctivae and secretions clear            Ears- Hearing, canals-normal            Nose- Clear, no-Septal dev, mucus, polyps, erosion, perforation             Throat- Mallampati II , mucosa clear , drainage- none, tonsils- atrophic Neck- flexible , trachea  midline, no stridor , thyroid nl, carotid no bruit Chest - symmetrical excursion , unlabored           Heart/CV- RRR , no murmur , no gallop  , no rub, nl s1 s2                           - JVD- none , edema- none, stasis changes- none, varices- none           Lung- clear to P&A, wheeze- none, cough- none , dullness-none, rub- none           Chest wall-  Abd-  Br/ Gen/ Rectal- Not done, not indicated Extrem- cyanosis- none, clubbing, none, atrophy- none, strength- nl Neuro- grossly intact to observation

## 2013-12-12 NOTE — Assessment & Plan Note (Signed)
She seems to be having less somatic discomfort and finding it easier to un- wind at night for sleep Okay to continue [employer]

## 2014-02-18 ENCOUNTER — Ambulatory Visit (INDEPENDENT_AMBULATORY_CARE_PROVIDER_SITE_OTHER): Payer: 59

## 2014-02-18 ENCOUNTER — Encounter: Payer: Self-pay | Admitting: Podiatry

## 2014-02-18 ENCOUNTER — Ambulatory Visit (INDEPENDENT_AMBULATORY_CARE_PROVIDER_SITE_OTHER): Payer: 59 | Admitting: Podiatry

## 2014-02-18 VITALS — BP 170/80 | HR 78 | Resp 15 | Ht 67.0 in | Wt 165.0 lb

## 2014-02-18 DIAGNOSIS — S92919A Unspecified fracture of unspecified toe(s), initial encounter for closed fracture: Secondary | ICD-10-CM

## 2014-02-18 DIAGNOSIS — S92911A Unspecified fracture of right toe(s), initial encounter for closed fracture: Secondary | ICD-10-CM

## 2014-02-18 NOTE — Progress Notes (Signed)
   Subjective:    Patient ID: Melanie Evans, female    DOB: Dec 29, 1945, 68 y.o.   MRN: [id number]  HPI Comments: N Toe injury L right 4th toe and metatarsal D Wednesday 02/12/2014 O tripped while vacuuming C pain, swelling and purplish color A enclosed sock or shoe, walking and pressure T splinting with bandaid, [place] for the bruising     Review of Systems  Constitutional: Negative.   HENT: Positive for sinus pressure, sneezing and tinnitus.   Eyes: Positive for itching.  Respiratory: Positive for cough.   Gastrointestinal: Positive for abdominal distention.  Endocrine: Negative.   Genitourinary: Negative.   Musculoskeletal: Positive for arthralgias and gait problem.  Allergic/Immunologic: Positive for environmental allergies.  Neurological: Negative.   Hematological: Bruises/bleeds easily.       Objective:   Physical Exam: I have reviewed her past medical history medications allergies surgeries social history and review of systems. Pulses are strongly palpable bilateral. Deep tendon reflexes are intact bilateral. Neurologic sensorium is intact bilateral. Muscle strength is intact bilateral. Orthopedic evaluation demonstrates mild hallux valgus deformities bilateral mild flexible hammertoe deformities bilateral with a red and swollen distal aspect of the fourth digit of the right foot with considerable pain on palpation. Radiographic evaluation does demonstrate an intra-articular fracture of the fourth digit of the right foot compromising the middle phalanx of the fourth toe right. There is minimal comminution and there is very little displacement.        Assessment & Plan:  Assessment: Fracture fourth toe right foot.  Plan: I demonstrated to her today how to wrap the toe with [place] suggested that she utilize the cam boot or a Darco shoe. She was dispensed a Darco shoe today.

## 2014-03-05 ENCOUNTER — Telehealth: Payer: Self-pay | Admitting: *Deleted

## 2014-03-05 NOTE — Telephone Encounter (Signed)
I broke a toe.  Dr. Milinda Pointer put some tape on it, colored tape.  Where can I purchase some of that?  I'm about out.  I called and informed her we sale it here.  She stated she was have someone to stop by to get some for her.

## 2014-03-20 ENCOUNTER — Ambulatory Visit (INDEPENDENT_AMBULATORY_CARE_PROVIDER_SITE_OTHER): Payer: 59

## 2014-03-20 ENCOUNTER — Encounter: Payer: Self-pay | Admitting: Podiatry

## 2014-03-20 ENCOUNTER — Ambulatory Visit (INDEPENDENT_AMBULATORY_CARE_PROVIDER_SITE_OTHER): Payer: 59 | Admitting: Podiatry

## 2014-03-20 VITALS — BP 140/80 | HR 72 | Resp 16

## 2014-03-20 DIAGNOSIS — S92919A Unspecified fracture of unspecified toe(s), initial encounter for closed fracture: Secondary | ICD-10-CM

## 2014-03-20 NOTE — Progress Notes (Signed)
She presents today for followup of her right fourth toe from his fracture along the distal aspect of the proximal phalanx. She states it is still sore on the bottom.  Objective: Vital signs are stable she is alert and oriented x3. The toe is still swollen mildly edematous with hyperpigmentation. Radiographic evaluation does demonstrate fracture that has really not healed a whole lot since I saw him last.  Assessment slowly healing fracture fourth toe right foot proximal phalanx.  Plan:

## 2014-03-27 DIAGNOSIS — S92919A Unspecified fracture of unspecified toe(s), initial encounter for closed fracture: Secondary | ICD-10-CM

## 2014-04-17 ENCOUNTER — Ambulatory Visit: Payer: 59 | Admitting: Podiatry

## 2014-06-12 ENCOUNTER — Telehealth: Payer: Self-pay | Admitting: Internal Medicine

## 2014-06-12 MED ORDER — ZALEPLON 10 MG PO CAPS: 10.0000 mg | ORAL_CAPSULE | Freq: Every evening | ORAL | Status: DC | PRN

## 2014-06-12 NOTE — Telephone Encounter (Signed)
Zaleplon (Sonata) last refilled at the 2.5.15 ov #30 w/ 5 refills Per last ov: Patient Instructions     We can continue present meds  Sonata refilled for use as needed   Pt was recommended to follow up with CDY in 1 year from last ov >> appt scheduled for 2.9.16  [facility] and spoke with pharmacist Barnett Applebaum and gave verbal authorization for #30 with another 5 refills Called spoke with patient and informed her that refills have been sent to her pharmacy and encouraged her to keep her Feb 2016 appt with CDY.  Pt voiced her understanding.  Nothing further needed at this time; will sign off.

## 2014-07-02 ENCOUNTER — Ambulatory Visit (INDEPENDENT_AMBULATORY_CARE_PROVIDER_SITE_OTHER): Payer: 59 | Admitting: Podiatry

## 2014-07-02 ENCOUNTER — Encounter: Payer: Self-pay | Admitting: Podiatry

## 2014-07-02 ENCOUNTER — Ambulatory Visit (INDEPENDENT_AMBULATORY_CARE_PROVIDER_SITE_OTHER): Payer: 59

## 2014-07-02 VITALS — BP 173/91 | HR 77 | Resp 16 | Ht 67.0 in | Wt 174.0 lb

## 2014-07-02 DIAGNOSIS — M722 Plantar fascial fibromatosis: Secondary | ICD-10-CM

## 2014-07-02 DIAGNOSIS — R52 Pain, unspecified: Secondary | ICD-10-CM

## 2014-07-02 NOTE — Patient Instructions (Signed)
Plantar Fasciitis  Plantar fasciitis is a common condition that causes foot pain. It is soreness (inflammation) of the band of tough fibrous tissue on the bottom of the foot that runs from the heel bone (calcaneus) to the ball of the foot. The cause of this soreness may be from excessive standing, poor fitting shoes, running on hard surfaces, being overweight, having an abnormal walk, or overuse (this is common in runners) of the painful foot or feet. It is also common in aerobic exercise dancers and ballet dancers.  SYMPTOMS   Most people with plantar fasciitis complain of:   Severe pain in the morning on the bottom of their foot especially when taking the first steps out of bed. This pain recedes after a few minutes of walking.   Severe pain is experienced also during walking following a long period of inactivity.   Pain is worse when walking barefoot or up stairs  DIAGNOSIS    Your caregiver will diagnose this condition by examining and feeling your foot.   Special tests such as X-rays of your foot, are usually not needed.  PREVENTION    Consult a sports medicine professional before beginning a new exercise program.   Walking programs offer a good workout. With walking there is a lower chance of overuse injuries common to runners. There is less impact and less jarring of the joints.   Begin all new exercise programs slowly. If problems or pain develop, decrease the amount of time or distance until you are at a comfortable level.   Wear good shoes and replace them regularly.   Stretch your foot and the heel cords at the back of the ankle (Achilles tendon) both before and after exercise.   Run or exercise on even surfaces that are not hard. For example, asphalt is better than pavement.   Do not run barefoot on hard surfaces.   If using a treadmill, vary the incline.   Do not continue to workout if you have foot or joint problems. Seek professional help if they do not improve.  HOME CARE INSTRUCTIONS     Avoid activities that cause you pain until you recover.   Use ice or cold packs on the problem or painful areas after working out.   Only take over-the-counter or prescription medicines for pain, discomfort, or fever as directed by your caregiver.   Soft shoe inserts or athletic shoes with air or gel sole cushions may be helpful.   If problems continue or become more severe, consult a sports medicine caregiver or your own health care provider. Cortisone is a potent anti-inflammatory medication that may be injected into the painful area. You can discuss this treatment with your caregiver.  MAKE SURE YOU:    Understand these instructions.   Will watch your condition.   Will get help right away if you are not doing well or get worse.  Document Released: 07/19/2001 Document Revised: 01/16/2012 Document Reviewed: 09/17/2008  [employer] Patient Information 2015 [employer]. This information is not intended to replace advice given to you by your health care provider. Make sure you discuss any questions you have with your health care provider.

## 2014-07-02 NOTE — Progress Notes (Signed)
   Subjective:    Patient ID: Melanie Evans, female    DOB: [birth year], 68 y.o.   MRN: [id number]  HPI Comments: N heel pain L left heel radiates through medial and lateral plantar fascia borders D 2 weeks O [place] C radiating sharp or stinging pain A walking and weight bearing T compression wrap  Pt states the right 4th toe that was diagnosed as a fracture in May 2015 is acting up again, due to offloading for the left foot pain.  Foot Pain      Review of Systems  Allergic/Immunologic: Positive for environmental allergies.  All other systems reviewed and are negative.      Objective:   Physical Exam  Orientated x3 white female  Vascular: DP and PT pulses 2/4 bilaterally  Neurological: Ankle reflex equal and reactive bilaterally  Dermatological:  texture and turgor within normal limits bilaterally  Musculoskeletal: Hammertoe deformities 2-5 bilaterally Exquisite palpable tenderness plantar left heel and mid fascial band left without palpable lesions Mild edema fourth right toe with slight palpable tenderness  X-ray examination weightbearing left foot  Intact bony structure without a fracture and/or dislocation  Decreased bone density noted in all 3 views  Inferior calcaneal spur  Radiographic impression:  No acute bony abnormality noted in left foot      Assessment & Plan:   Assessment: Plantar fasciitis left Residual symptoms of fracture fourth right toe  Plan: Offered patient Kenalog injection into the left heel and she verbally consents Skin is prepped with alcohol and Betadine and 10 mg of Kenalog mixed with 10 mg of plain Xylocaine at 2.5 mg of plain Marcaine injected into the inferior left heel for Kenalog injection #1  Fasciitis strap  dispensed to wear a conjunction with soft over-the-counter existing arch supports Bent stretching recommended Wear athletic shoes and treatment of plantar fasciitis left and residual  symptoms of fracture fourth right toe  Reappoint x30 days if symptoms of plantar fasciitis left or not improving

## 2014-07-03 ENCOUNTER — Encounter: Payer: Self-pay | Admitting: Podiatry

## 2014-07-03 DIAGNOSIS — M722 Plantar fascial fibromatosis: Secondary | ICD-10-CM

## 2014-07-03 MED ORDER — TRIAMCINOLONE ACETONIDE 10 MG/ML IJ SUSP
10.0000 mg | Freq: Once | INTRAMUSCULAR | Status: AC
  Administered 2014-07-03: 10 mg

## 2014-07-08 ENCOUNTER — Other Ambulatory Visit: Payer: Self-pay | Admitting: Obstetrics and Gynecology

## 2014-07-08 DIAGNOSIS — R928 Other abnormal and inconclusive findings on diagnostic imaging of breast: Secondary | ICD-10-CM

## 2014-07-16 ENCOUNTER — Ambulatory Visit
Admission: RE | Admit: 2014-07-16 | Discharge: 2014-07-16 | Disposition: A | Discharge status: Home / Self Care | Payer: 59 | Source: Ambulatory Visit | Attending: Obstetrics and Gynecology | Admitting: Obstetrics and Gynecology

## 2014-07-16 DIAGNOSIS — R928 Other abnormal and inconclusive findings on diagnostic imaging of breast: Secondary | ICD-10-CM

## 2014-07-30 ENCOUNTER — Ambulatory Visit: Payer: 59 | Admitting: Podiatry

## 2014-12-16 ENCOUNTER — Encounter: Payer: Self-pay | Admitting: Internal Medicine

## 2014-12-16 ENCOUNTER — Ambulatory Visit (INDEPENDENT_AMBULATORY_CARE_PROVIDER_SITE_OTHER): Payer: 59 | Admitting: Internal Medicine

## 2014-12-16 VITALS — BP 118/76 | HR 75 | Ht 67.0 in | Wt 180.2 lb

## 2014-12-16 DIAGNOSIS — F5104 Psychophysiologic insomnia: Secondary | ICD-10-CM

## 2014-12-16 DIAGNOSIS — G4700 Insomnia, unspecified: Secondary | ICD-10-CM

## 2014-12-16 MED ORDER — CLONAZEPAM 0.5 MG PO TABS: ORAL_TABLET | ORAL | Status: DC

## 2014-12-16 NOTE — Progress Notes (Signed)
10/15/12- 79 yoF former smoker referred by Melanie Evans- Insomnia-  Melanie Evans; wants to come off of Temazepam if possible She dates onset of sleep problems to 6 years ago when she fell, fracturing left fingers. She developed reflex sympathetic dystrophy treated with medications including pain medications. She says she slept fine until then. . Since then she has difficulty sleeping without taking medication and feels dependent on temazepam to get any sleep at all. Previously failed trazodone and doxepin. Benadryl doesn't make her sleepy. She always feels "revved up" and is sensitive to stimulants. Thyroid has tested normal. Bedtime 10 to 10:30 PM. Sleep latency was temazepam 15 minutes. Wakes 2 or 3 times during the night before finally up at 8:30 AM. Nocturnal waking attributed to being too hot or too cold, bathroom, and she is quite bothered by outdoor light at her neighbors home coming through the window. She has lost 10 or 12 pounds in the last 2 years. Denies snoring and says sleep apnea not likely. History of deviated septum repair, seasonal allergy and asthma. She smoked only briefly in high school. Rare alcohol. She is married, retired, living with husband. 3 children. Allergy vaccine management by Melanie. Donneta Romberg.  11/27/12- 74 yoF former smoker referred by Melanie Evans- Insomnia since fall with hand injury/reflex sympathetic dystrophy Follows For: Sleeping better with [facility] but has been unable to do without it due to lack of bedtime routine in December and January She can't relax when her husband is around the house. 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 This changes there living schedules which she finds disruptive of her sleep. Melanie Evans works well. We discussed alternatives including what to expect from melatonin and relaxation therapies such as yoga.  06/11/13-66 yoF former smoker referred by Melanie Evans- Insomnia since fall with hand injury/reflex sympathetic dystrophy FOLLOWS FOR: has good and bad  nights even with medicine. Had a rough night; her body will not let her rest. Off Lunesta, using Sonata. Chiropractor for adjustment of neck to avoid headaches which bothered her sleep.  12/12/13- 44 yoF former smoker referred by Melanie Evans- Insomnia since fall with hand injury/reflex sympathetic dystrophy Follows For: Sleep is doing better - Taking Sonata as needed, about twice a week. Occasional difficulty relaxing but seems to be doing substantially better than in the past. Uses a chiropractor and is following a gluten-free, sugar-free, nondairy diet which she associates with less reflux.  12/16/14- 72 yoF former smoker referred by Melanie Evans- Insomnia since fall with hand injury/reflex sympathetic dystrophy FOLLOWS FOR: Continues to have trouble with sleep-thinks this is due to her inflammation/pain in feet,etc. Stays "jittery". Lists arthritis, plantar fasciitis, nocturia. Very sensitive to any caffeine. Says she failed trazodone and Lunesta. She is afraid of what she has heard about Ambien side effects/sleepwalking her zaleplon "relaxes her" so she can go to sleep but often lets her wake during the night. the middle of the night.  ROS-see HPI Constitutional:   No-   weight loss, night sweats, fevers, chills, fatigue, lassitude. HEENT:   + headaches, no-difficulty swallowing, tooth/dental problems, sore throat,       No-  sneezing, itching, ear ache, nasal congestion, post nasal drip,  CV:  No-   chest pain, orthopnea, PND, swelling in lower extremities, anasarca,  dizziness, palpitations Resp: No-   shortness of breath with exertion or at rest.              No-   productive cough,  No non-productive cough,  No- coughing  up of blood.              No-   change in color of mucus.  No- wheezing.   Skin: No-   rash or lesions. GI:  No-   heartburn, indigestion, abdominal pain, nausea, vomiting,  GU:  MS:  No-   joint pain or swelling.   Neuro-     nothing unusual Psych:  No- change in mood or affect.  No depression or anxiety.  No memory loss.  OBJ- Physical Exam General- Alert, Oriented, Affect-appropriate, Distress- none acute. Appears well.  Skin- rash-none, lesions- none, excoriation- none Lymphadenopathy- none Head- atraumatic            Eyes- Gross vision intact, PERRLA, conjunctivae and secretions clear            Ears- Hearing, canals-normal            Nose- Clear, no-Septal dev, mucus, polyps, erosion, perforation             Throat- Mallampati II , mucosa clear , drainage- none, tonsils- atrophic Neck- flexible , trachea midline, no stridor , thyroid nl, carotid no bruit Chest - symmetrical excursion , unlabored           Heart/CV- RRR , no murmur , no gallop  , no rub, nl s1 s2                           - JVD- none , edema- none, stasis changes- none, varices- none           Lung- clear to P&A, wheeze- none, cough- none , dullness-none, rub- none           Chest wall-  Abd-  Br/ Gen/ Rectal- Not done, not indicated Extrem- cyanosis- none, clubbing, none, atrophy- none, strength- nl Neuro- grossly intact to observation

## 2014-12-16 NOTE — Patient Instructions (Addendum)
Script for clonazepam/ Klonopin 0.5 mg,     Try 1-3 tabs, about 30 minutes before bedtime, for sleep, as needed     Try this instead of zaleplon

## 2014-12-16 NOTE — Assessment & Plan Note (Signed)
Zaleplon helps her insomnia but she complains of waking during the night and difficulty falling back asleep. We discussed clonazepam as a longer half-life alternative for trial. She seems like a high-tension individual. Chronic pain is a factor.

## 2015-03-10 ENCOUNTER — Other Ambulatory Visit: Payer: Self-pay

## 2015-03-10 DIAGNOSIS — Z1231 Encounter for screening mammogram for malignant neoplasm of breast: Secondary | ICD-10-CM

## 2015-03-12 ENCOUNTER — Telehealth: Payer: Self-pay | Admitting: Internal Medicine

## 2015-03-12 NOTE — Telephone Encounter (Signed)
The amount of aluminum would be tiny. Don't think this is a medical problem for her, if the drug is valuable otherwise.

## 2015-03-12 NOTE — Telephone Encounter (Signed)
Pt is aware. Nothing further needed 

## 2015-03-12 NOTE — Telephone Encounter (Signed)
Called and spoke to Melanie Evans. Melanie Evans stated she received a health report on Clonazepam containing aluminum. Melanie Evans is concerned and does not want to take the medication anymore if it is harmful or could be harmful. Melanie Evans requesting recs from CY. Melanie Evans questioning- is the aluminum in small amounts that it is not harmful or should she change medications. Melanie Evans stated the med is helping her sleep much better.   CY please advise.   Allergies  Allergen Reactions  . Cefzil [Cefprozil]   . Erythromycin   . Sulfa Antibiotics     Current Outpatient Prescriptions on File Prior to Visit  Medication Sig Dispense Refill  . albuterol (VENTOLIN HFA) 108 (90 BASE) MCG/ACT inhaler Inhale 2 puffs into the lungs every 6 (six) hours as needed.    Marland Kitchen ALVESCO 80 MCG/ACT inhaler Inhale 1 puff into the lungs Twice daily.    . ASTELIN 137 MCG/SPRAY nasal spray 2 sprays each nostril every am    . Cholecalciferol (VITAMIN D-3) 5000 UNITS TABS Take 1 tablet by mouth daily.    . Ciclesonide (ZETONNA) 37 MCG/ACT AERS Place 1 spray into both nostrils daily.    . clonazePAM (KLONOPIN) 0.5 MG tablet 1-3 tabs for sleep if needed 50 tablet 5  . Coenzyme Q10 (COQ10) 200 MG CAPS Take 1 capsule by mouth daily.    . Cyanocobalamin (VITAMIN B-12) 500 MCG SUBL Place 1 tablet under the tongue daily after lunch.    . EPIPEN 2-PAK 0.3 MG/0.3ML DEVI     . estradiol (ESTRACE) 0.1 MG/GM vaginal cream Place 2 g vaginally 2 (two) times a week.    . milk thistle 175 MG tablet Take 175 mg by mouth daily.    . Multiple Vitamins-Minerals (ANTIOXIDANT PO) Take 2 capsules by mouth daily.    . OMEGA 3 1000 MG CAPS Take 1 capsule by mouth daily.    Marland Kitchen SINGULAIR 10 MG tablet Take 1 tablet by mouth daily.    Marland Kitchen XYZAL 5 MG tablet Take 1 tablet by mouth daily.    . zaleplon (SONATA) 10 MG capsule Take 1 capsule (10 mg total) by mouth at bedtime as needed for sleep. 30 capsule 5   No current facility-administered medications on file prior to visit.

## 2015-04-02 ENCOUNTER — Encounter: Payer: Self-pay | Admitting: Podiatry

## 2015-04-02 ENCOUNTER — Ambulatory Visit (INDEPENDENT_AMBULATORY_CARE_PROVIDER_SITE_OTHER): Payer: 59

## 2015-04-02 ENCOUNTER — Ambulatory Visit (INDEPENDENT_AMBULATORY_CARE_PROVIDER_SITE_OTHER): Payer: 59 | Admitting: Podiatry

## 2015-04-02 VITALS — BP 114/70 | HR 70 | Resp 79

## 2015-04-02 DIAGNOSIS — M79675 Pain in left toe(s): Secondary | ICD-10-CM | POA: Diagnosis not present

## 2015-04-02 DIAGNOSIS — S92912A Unspecified fracture of left toe(s), initial encounter for closed fracture: Secondary | ICD-10-CM | POA: Diagnosis not present

## 2015-04-02 NOTE — Progress Notes (Signed)
She presents today with chief complaint of a painful swollen fifth toe left foot. States that a couple weeks ago she stated the toe over the coracoid and the foot began to swell.  Objective: Vital signs are stable she's alert 3 pulses are palpable left lower extremity. Moderate edema overlying the fourth and fifth metatarsophalangeal joint of the left foot. Pain on palpation of the proximal phalanx fifth toe left foot. Radiograph confirms a fracture to the proximal medial condyle at the metatarsophalangeal joint.  Assessment: Fracture fifth toe left.  Plan: Placed her in a compression anklet in a Darco shoe suggested she remain in issue for 1 month. I will follow-up with her at that time if necessary.

## 2015-04-03 ENCOUNTER — Telehealth: Payer: Self-pay | Admitting: *Deleted

## 2015-04-03 NOTE — Telephone Encounter (Signed)
Pt states she was given a compression sock and boot, but was not certain as to when and how to wear.  I told the pt to shower the night before, handwash the compression sock and air dry, then put it on 1st thing in the morning and wear all day with the boot, the indicator as to when to stop would be when the lack of pain and swelling were consistent.  Pt states understanding.

## 2015-06-17 ENCOUNTER — Encounter: Payer: Self-pay | Admitting: Internal Medicine

## 2015-06-17 ENCOUNTER — Ambulatory Visit (INDEPENDENT_AMBULATORY_CARE_PROVIDER_SITE_OTHER): Payer: 59 | Admitting: Internal Medicine

## 2015-06-17 VITALS — BP 122/82 | HR 73 | Ht 67.0 in | Wt 178.6 lb

## 2015-06-17 DIAGNOSIS — F5104 Psychophysiologic insomnia: Secondary | ICD-10-CM

## 2015-06-17 DIAGNOSIS — G4700 Insomnia, unspecified: Secondary | ICD-10-CM

## 2015-06-17 MED ORDER — CLONAZEPAM 0.5 MG PO TABS: ORAL_TABLET | ORAL | Status: DC

## 2015-06-17 NOTE — Assessment & Plan Note (Signed)
Light sleeper with chronic difficulty initiating and maintaining sleep, aggravated by generalized anxiety Plan-continue clonazepam. Consider Belsomra as a future option

## 2015-06-17 NOTE — Patient Instructions (Addendum)
Script printed refilling clonazepam   Please call if we can help  Name for your husband Dr Casimiro Needle

## 2015-06-17 NOTE — Progress Notes (Signed)
10/15/12- 59 yoF former smoker referred by Dr Harrington Challenger- Insomnia-  Dr Harrington Challenger; wants to come off of Temazepam if possible She dates onset of sleep problems to 6 years ago when she fell, fracturing left fingers. She developed reflex sympathetic dystrophy treated with medications including pain medications. She says she slept fine until then. . Since then she has difficulty sleeping without taking medication and feels dependent on temazepam to get any sleep at all. Previously failed trazodone and doxepin. Benadryl doesn't make her sleepy. She always feels "revved up" and is sensitive to stimulants. Thyroid has tested normal. Bedtime 10 to 10:30 PM. Sleep latency was temazepam 15 minutes. Wakes 2 or 3 times during the night before finally up at 8:30 AM. Nocturnal waking attributed to being too hot or too cold, bathroom, and she is quite bothered by outdoor light at her neighbors home coming through the window. She has lost 10 or 12 pounds in the last 2 years. Denies snoring and says sleep apnea not likely. History of deviated septum repair, seasonal allergy and asthma. She smoked only briefly in high school. Rare alcohol. She is married, retired, living with husband. 3 children. Allergy vaccine management by Dr. Donneta Romberg.  11/27/12- 3 yoF former smoker referred by Dr Harrington Challenger- Insomnia since fall with hand injury/reflex sympathetic dystrophy Follows For: Sleeping better with [facility] but has been unable to do without it due to lack of bedtime routine in December and January She can't relax when her husband is around the house. 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 This changes there living schedules which she finds disruptive of her sleep. [name] works well. We discussed alternatives including what to expect from melatonin and relaxation therapies such as yoga.  06/11/13-66 yoF former smoker referred by Dr Harrington Challenger- Insomnia since fall with hand injury/reflex sympathetic dystrophy FOLLOWS FOR: has good and bad  nights even with medicine. Had a rough night; her body will not let her rest. Off Lunesta, using Sonata. Chiropractor for adjustment of neck to avoid headaches which bothered her sleep.  12/12/13- 22 yoF former smoker referred by Dr Harrington Challenger- Insomnia since fall with hand injury/reflex sympathetic dystrophy Follows For: Sleep is doing better - Taking Sonata as needed, about twice a week. Occasional difficulty relaxing but seems to be doing substantially better than in the past. Uses a chiropractor and is following a gluten-free, sugar-free, nondairy diet which she associates with less reflux.  12/16/14- 37 yoF former smoker referred by Dr Harrington Challenger- Insomnia since fall with hand injury/reflex sympathetic dystrophy FOLLOWS FOR: Continues to have trouble with sleep-thinks this is due to her inflammation/pain in feet,etc. Stays "jittery". Lists arthritis, plantar fasciitis, nocturia. Very sensitive to any caffeine. Says she failed trazodone and Lunesta. She is afraid of what she has heard about Ambien side effects/sleepwalking her zaleplon "relaxes her" so she can go to sleep but often lets her wake during the night. the middle of the night.  06/17/15- 38 yoF former smoker referred by Dr Harrington Challenger- Insomnia since fall with hand injury/reflex sympathetic dystrophy Follows For: Sleeping well when taking one Clonazepam 0.5 mg on occ will need to take two pillls.  She has done much better with clonazepam than she did with Sonata. I think the anxiolytic component is important. She denies morning carryover. She talked extensively about how hard it was for her to sleep when her husband was away from town on a trip. She does not tolerate change of routine very well but is otherwise stable and doing better.  ROS-see HPI Constitutional:   No-   weight loss, night sweats, fevers, chills, fatigue, lassitude. HEENT:   + headaches, no-difficulty swallowing, tooth/dental problems, sore throat,       No-  sneezing, itching, ear  ache, nasal congestion, post nasal drip,  CV:  No-   chest pain, orthopnea, PND, swelling in lower extremities, anasarca,  dizziness, palpitations Resp: No-   shortness of breath with exertion or at rest.              No-   productive cough,  No non-productive cough,  No- coughing up of blood.              No-   change in color of mucus.  No- wheezing.   Skin: No-   rash or lesions. GI:  No-   heartburn, indigestion, abdominal pain, nausea, vomiting,  GU:  MS:  No-   joint pain or swelling.   Neuro-     nothing unusual Psych:  No- change in mood or affect. No depression or anxiety.  No memory loss.  OBJ- Physical Exam General- Alert, Oriented, Affect-appropriate, Distress- none acute. Appears well.  Skin- rash-none, lesions- none, excoriation- none Lymphadenopathy- none Head- atraumatic            Eyes- Gross vision intact, PERRLA, conjunctivae and secretions clear            Ears- Hearing, canals-normal            Nose- Clear, no-Septal dev, mucus, polyps, erosion, perforation             Throat- Mallampati II , mucosa clear , drainage- none, tonsils- atrophic Neck- flexible , trachea midline, no stridor , thyroid nl, carotid no bruit Chest - symmetrical excursion , unlabored           Heart/CV- RRR , no murmur , no gallop  , no rub, nl s1 s2                           - JVD- none , edema- none, stasis changes- none, varices- none           Lung- clear to P&A, wheeze- none, cough- none , dullness-none, rub- none           Chest wall-  Abd-  Br/ Gen/ Rectal- Not done, not indicated Extrem- cyanosis- none, clubbing, none, atrophy- none, strength- nl Neuro- grossly intact to observation

## 2015-07-06 ENCOUNTER — Ambulatory Visit
Admission: RE | Admit: 2015-07-06 | Discharge: 2015-07-06 | Disposition: A | Discharge status: Home / Self Care | Payer: 59 | Source: Ambulatory Visit

## 2015-07-06 ENCOUNTER — Ambulatory Visit: Payer: 59

## 2015-07-06 DIAGNOSIS — Z1231 Encounter for screening mammogram for malignant neoplasm of breast: Secondary | ICD-10-CM

## 2016-01-05 ENCOUNTER — Telehealth: Payer: Self-pay | Admitting: Internal Medicine

## 2016-01-05 MED ORDER — CLONAZEPAM 0.5 MG PO TABS: ORAL_TABLET | ORAL | Status: DC

## 2016-01-05 NOTE — Telephone Encounter (Signed)
Spoke with pt.  Current rx for Clonazepm has expired and pt is requesting a refill .  Last filled 06/17/15 for #50 tablets.  Please advise if ok to refill.  Current Outpatient Prescriptions on File Prior to Visit  Medication Sig Dispense Refill  . albuterol (VENTOLIN HFA) 108 (90 BASE) MCG/ACT inhaler Inhale 2 puffs into the lungs every 6 (six) hours as needed.    . ASTELIN 137 MCG/SPRAY nasal spray 2 sprays each nostril every am    . beclomethasone (QVAR) 40 MCG/ACT inhaler Inhale 2 puffs into the lungs 2 (two) times daily.    . Ciclesonide (ZETONNA) 37 MCG/ACT AERS Place 1 spray into the nose daily.    . clonazePAM (KLONOPIN) 0.5 MG tablet 1-3 tabs for sleep if needed 50 tablet 5  . Coenzyme Q10 (COQ10) 200 MG CAPS Take 1 capsule by mouth daily.    . Cyanocobalamin (VITAMIN B-12) 500 MCG SUBL Place 1 tablet under the tongue daily after lunch.    . EPIPEN 2-PAK 0.3 MG/0.3ML DEVI     . ergocalciferol (DRISDOL) 8000 UNIT/ML drops 7 drops qd    . estradiol (ESTRACE) 0.1 MG/GM vaginal cream Place 2 g vaginally 2 (two) times a week.    . milk thistle 175 MG tablet Take 175 mg by mouth daily.    . Multiple Vitamins-Minerals (ANTIOXIDANT PO) Take 2 capsules by mouth daily.    . OMEGA 3 1000 MG CAPS Take 1 capsule by mouth daily.    Marland Kitchen SINGULAIR 10 MG tablet Take 1 tablet by mouth daily.    Marland Kitchen XYZAL 5 MG tablet Take 1 tablet by mouth daily.     No current facility-administered medications on file prior to visit.

## 2016-01-05 NOTE — Telephone Encounter (Signed)
Ok to refill clonazepam

## 2016-01-05 NOTE — Telephone Encounter (Signed)
Spoke with pt and advised that refill was called to pharmacy.

## 2016-05-26 ENCOUNTER — Other Ambulatory Visit: Payer: Self-pay | Admitting: Obstetrics and Gynecology

## 2016-05-26 DIAGNOSIS — Z1231 Encounter for screening mammogram for malignant neoplasm of breast: Secondary | ICD-10-CM

## 2016-06-21 ENCOUNTER — Ambulatory Visit (INDEPENDENT_AMBULATORY_CARE_PROVIDER_SITE_OTHER): Payer: BLUE CROSS/BLUE SHIELD | Admitting: Internal Medicine

## 2016-06-21 ENCOUNTER — Encounter: Payer: Self-pay | Admitting: Internal Medicine

## 2016-06-21 DIAGNOSIS — G4700 Insomnia, unspecified: Secondary | ICD-10-CM | POA: Diagnosis not present

## 2016-06-21 DIAGNOSIS — F5104 Psychophysiologic insomnia: Secondary | ICD-10-CM

## 2016-06-21 MED ORDER — CLONAZEPAM 0.5 MG PO TABS: ORAL_TABLET | ORAL | 5 refills | Status: DC

## 2016-06-21 NOTE — Patient Instructions (Signed)
Refill script for clonazepam  We discussed relaxation methods

## 2016-06-21 NOTE — Progress Notes (Signed)
History of present illness  F former smoker followed for chronic insomnia She dates onset of sleep problems to 6 years ago when she fell, fracturing left fingers. She developed reflex sympathetic dystrophy treated with medications including pain medications. She says she slept fine until then.   12/12/13- 67 yoF former smoker referred by Dr Harrington Challenger- Insomnia since fall with hand injury/reflex sympathetic dystrophy Follows For: Sleep is doing better - Taking Sonata as needed, about twice a week. Occasional difficulty relaxing but seems to be doing substantially better than in the past. Uses a chiropractor and is following a gluten-free, sugar-free, nondairy diet which she associates with less reflux.  12/16/14- 12 yoF former smoker referred by Dr Harrington Challenger- Insomnia since fall with hand injury/reflex sympathetic dystrophy FOLLOWS FOR: Continues to have trouble with sleep-thinks this is due to her inflammation/pain in feet,etc. Stays "jittery". Lists arthritis, plantar fasciitis, nocturia. Very sensitive to any caffeine. Says she failed trazodone and Lunesta. She is afraid of what she has heard about Ambien side effects/sleepwalking her zaleplon "relaxes her" so she can go to sleep but often lets her wake during the night. the middle of the night.  06/17/15- 68 yoF former smoker referred by Dr Harrington Challenger- Insomnia since fall with hand injury/reflex sympathetic dystrophy Follows For: Sleeping well when taking one Clonazepam 0.5 mg on occ will need to take two pillls.  She has done much better with clonazepam than she did with Sonata. I think the anxiolytic component is important. She denies morning carryover. She talked extensively about how hard it was for her to sleep when her husband was away from town on a trip. She does not tolerate change of routine very well but is otherwise stable and doing better.  06/21/2016-70 year old female former smoker followed for chronic insomnia since fall with hand injury/reflex  sympathetic dystrophy., Complicated by allergic rhinitis (Dr. Donneta Romberg) FOLLOWS FOR: Pt states her allergies have kicked in and now affecting her sleep; pt has been under more stress this summer due to family moving,etc. She likes regular schedule and calm home. Husband is "high energy", teaching evening classes at college. While her daughter is moving to a new home, children and furniture are at her place. She has trouble coping with all of this. Has "calm pink light for bedtime reading, said to be helpful. Not using stimulant medications for her allergies as discussed-managed by Dr. Donneta Romberg. One clonazepam at bedtime still works very well unless she is really worked up by events of the day.    ROS-see HPI Constitutional:   No-   weight loss, night sweats, fevers, chills, fatigue, lassitude. HEENT:   + headaches, no-difficulty swallowing, tooth/dental problems, sore throat,       No-  sneezing, itching, ear ache, nasal congestion, post nasal drip,  CV:  No-   chest pain, orthopnea, PND, swelling in lower extremities, anasarca,  dizziness, palpitations Resp: No-   shortness of breath with exertion or at rest.              No-   productive cough,  No non-productive cough,  No- coughing up of blood.              No-   change in color of mucus.  No- wheezing.   Skin: No-   rash or lesions. GI:  No-   heartburn, indigestion, abdominal pain, nausea, vomiting,  GU:  MS:  No-   joint pain or swelling.   Neuro-     nothing unusual Psych:  No-  change in mood or affect. No depression or anxiety.  No memory loss.  OBJ- Physical Exam General- Alert, Oriented, Affect-appropriate, Distress- none acute. Appears well.  Skin- rash-none, lesions- none, excoriation- none Lymphadenopathy- none Head- atraumatic            Eyes- Gross vision intact, PERRLA, conjunctivae and secretions clear            Ears- Hearing, canals-normal            Nose- Clear, no-Septal dev, mucus, polyps, erosion, perforation              Throat- Mallampati II , mucosa clear , drainage- none, tonsils- atrophic Neck- flexible , trachea midline, no stridor , thyroid nl, carotid no bruit Chest - symmetrical excursion , unlabored           Heart/CV- RRR , no murmur , no gallop  , no rub, nl s1 s2                           - JVD- none , edema- none, stasis changes- none, varices- none           Lung- clear to P&A, wheeze- none, cough- none , dullness-none, rub- none           Chest wall-  Abd-  Br/ Gen/ Rectal- Not done, not indicated Extrem- cyanosis- none, clubbing, none, atrophy- none, strength- nl Neuro- grossly intact to observation

## 2016-06-21 NOTE — Assessment & Plan Note (Signed)
She appears fairly stable. Typical chronic insomnia pattern, easily disturbed by social events and external pressures. Trying to maintain good sleep habits. We discussed long-term use of clonazepam with no problems identified she appears better off with it than without it at this time.

## 2016-07-06 ENCOUNTER — Ambulatory Visit
Admission: RE | Admit: 2016-07-06 | Discharge: 2016-07-06 | Disposition: A | Discharge status: Home / Self Care | Payer: BLUE CROSS/BLUE SHIELD | Source: Ambulatory Visit | Attending: Obstetrics and Gynecology | Admitting: Obstetrics and Gynecology

## 2016-07-06 DIAGNOSIS — Z1231 Encounter for screening mammogram for malignant neoplasm of breast: Secondary | ICD-10-CM

## 2016-07-14 ENCOUNTER — Other Ambulatory Visit: Payer: Self-pay | Admitting: Obstetrics and Gynecology

## 2016-07-14 DIAGNOSIS — N644 Mastodynia: Secondary | ICD-10-CM

## 2016-07-20 ENCOUNTER — Ambulatory Visit
Admission: RE | Admit: 2016-07-20 | Discharge: 2016-07-20 | Disposition: A | Discharge status: Home / Self Care | Payer: BLUE CROSS/BLUE SHIELD | Source: Ambulatory Visit | Attending: Obstetrics and Gynecology | Admitting: Obstetrics and Gynecology

## 2016-07-20 ENCOUNTER — Other Ambulatory Visit: Payer: BLUE CROSS/BLUE SHIELD

## 2016-07-20 DIAGNOSIS — N644 Mastodynia: Secondary | ICD-10-CM

## 2016-12-27 DIAGNOSIS — G8929 Other chronic pain: Secondary | ICD-10-CM | POA: Insufficient documentation

## 2016-12-27 DIAGNOSIS — M25522 Pain in left elbow: Secondary | ICD-10-CM

## 2017-05-08 ENCOUNTER — Telehealth: Payer: Self-pay | Admitting: Internal Medicine

## 2017-05-08 ENCOUNTER — Other Ambulatory Visit: Payer: Self-pay | Admitting: Obstetrics and Gynecology

## 2017-05-08 ENCOUNTER — Encounter: Payer: Self-pay | Admitting: Internal Medicine

## 2017-05-08 DIAGNOSIS — Z1231 Encounter for screening mammogram for malignant neoplasm of breast: Secondary | ICD-10-CM

## 2017-05-08 MED ORDER — CLONAZEPAM 0.5 MG PO TABS: ORAL_TABLET | ORAL | 5 refills | Status: DC

## 2017-05-08 NOTE — Telephone Encounter (Signed)
Ok to refill 6 months 

## 2017-05-08 NOTE — Telephone Encounter (Signed)
Pt aware of refill approval. rx called in to [facility].  Nothing further needed.

## 2017-05-08 NOTE — Telephone Encounter (Signed)
Pt is requesting a refill on Clonazepam. Her last OV with CY was on 06/21/16. Has pending OV with CY on 08/09/17. Last refill was on 06/21/16 #50 with 5 refills.  CY - please advise. Thanks.

## 2017-07-21 ENCOUNTER — Ambulatory Visit: Payer: BLUE CROSS/BLUE SHIELD

## 2017-07-27 ENCOUNTER — Ambulatory Visit
Admission: RE | Admit: 2017-07-27 | Discharge: 2017-07-27 | Disposition: A | Discharge status: Home / Self Care | Payer: BLUE CROSS/BLUE SHIELD | Source: Ambulatory Visit | Attending: Obstetrics and Gynecology | Admitting: Obstetrics and Gynecology

## 2017-07-27 DIAGNOSIS — Z1231 Encounter for screening mammogram for malignant neoplasm of breast: Secondary | ICD-10-CM

## 2017-07-28 ENCOUNTER — Other Ambulatory Visit: Payer: Self-pay | Admitting: Obstetrics and Gynecology

## 2017-07-28 DIAGNOSIS — R928 Other abnormal and inconclusive findings on diagnostic imaging of breast: Secondary | ICD-10-CM

## 2017-08-03 ENCOUNTER — Ambulatory Visit
Admission: RE | Admit: 2017-08-03 | Discharge: 2017-08-03 | Disposition: A | Discharge status: Home / Self Care | Payer: BLUE CROSS/BLUE SHIELD | Source: Ambulatory Visit | Attending: Obstetrics and Gynecology | Admitting: Obstetrics and Gynecology

## 2017-08-03 DIAGNOSIS — R928 Other abnormal and inconclusive findings on diagnostic imaging of breast: Secondary | ICD-10-CM

## 2017-08-09 ENCOUNTER — Ambulatory Visit (INDEPENDENT_AMBULATORY_CARE_PROVIDER_SITE_OTHER): Payer: BLUE CROSS/BLUE SHIELD | Admitting: Internal Medicine

## 2017-08-09 ENCOUNTER — Encounter: Payer: Self-pay | Admitting: Internal Medicine

## 2017-08-09 DIAGNOSIS — F5104 Psychophysiologic insomnia: Secondary | ICD-10-CM

## 2017-08-09 DIAGNOSIS — J301 Allergic rhinitis due to pollen: Secondary | ICD-10-CM | POA: Diagnosis not present

## 2017-08-09 MED ORDER — CLONAZEPAM 0.5 MG PO TABS: ORAL_TABLET | ORAL | 5 refills | Status: DC

## 2017-08-09 NOTE — Progress Notes (Signed)
HPI female former smoker followed for chronic insomnia since fall with hand injury/reflex sympathetic dystrophy., Complicated by allergic rhinitis (Dr. Donneta Romberg) Says she failed trazodone and Lunesta. She is afraid of what she has heard about Ambien side effects/sleepwalking her zaleplon "relaxes her" so she can go to sleep but often lets her wake during the night. the middle of the night  ----------------------------------------------------------------------------------.  06/21/2016-71 year old female former smoker followed for chronic insomnia since fall with hand injury/reflex sympathetic dystrophy., Complicated by allergic rhinitis (Dr. Donneta Romberg) FOLLOWS FOR: Pt states her allergies have kicked in and now affecting her sleep; pt has been under more stress this summer due to family moving,etc. She likes regular schedule and calm home. Husband is "high energy", teaching evening classes at college. While her daughter is moving to a new home, children and furniture are at her place. She has trouble coping with all of this. Has "calm pink light for bedtime reading, said to be helpful. Not using stimulant medications for her allergies as discussed-managed by Dr. Donneta Romberg. One clonazepam at bedtime still works very well unless she is really worked up by events of the day.  08/09/17- 71 year old female former smoker followed for chronic insomnia since fall with hand injury/reflex sympathetic dystrophy., Complicated by allergic rhinitis (Dr. Donneta Romberg) FOLLOWS FOR- Chronic Insomnia. Pt has not been sleeping lately.  WTW on flu vax Clonazepam 0.5 mg 1-3/at bedtime does work, used 2 or 3 times per week. She feels she is doing well enough, just putting up with her insomnia. Denies problems with daytime sleepiness. Basic pattern hasn't changed but she has gotten used to it.    ROS-see HPI + = positive Constitutional:   No-   weight loss, night sweats, fevers, chills, fatigue, lassitude. HEENT:   + headaches,  no-difficulty swallowing, tooth/dental problems, sore throat,       No-  sneezing, itching, ear ache, nasal congestion, post nasal drip,  CV:  No-   chest pain, orthopnea, PND, +swelling in lower extremities, anasarca,  dizziness, palpitations Resp: No-   shortness of breath with exertion or at rest.              No-   productive cough,  No non-productive cough,  No- coughing up of blood.              No-   change in color of mucus.  No- wheezing.   Skin: No-   rash or lesions. GI:  No-   heartburn, indigestion, abdominal pain, nausea, vomiting,  GU:  MS:  No-   joint pain or swelling.   Neuro-     nothing unusual Psych:  No- change in mood or affect. No depression or anxiety.  No memory loss.  OBJ- Physical Exam General- Alert, Oriented, Affect-appropriate, Distress- none acute. Appears well.  Skin- rash-none, lesions- none, excoriation- none Lymphadenopathy- none Head- atraumatic            Eyes- Gross vision intact, PERRLA, conjunctivae and secretions clear            Ears- Hearing, canals-normal            Nose- Clear, no-Septal dev, mucus, polyps, erosion, perforation             Throat- Mallampati II , mucosa clear , drainage- none, tonsils- atrophic Neck- flexible , trachea midline, no stridor , thyroid nl, carotid no bruit Chest - symmetrical excursion , unlabored           Heart/CV- RRR , no murmur ,  no gallop  , no rub, nl s1 s2                           - JVD- none , edema- none, stasis changes- none, varices- none           Lung- clear to P&A, wheeze- none, cough- none , dullness-none, rub- none           Chest wall-  Abd-  Br/ Gen/ Rectal- Not done, not indicated Extrem- cyanosis- none, clubbing, none, atrophy- none, strength- nl Neuro- grossly intact to observation

## 2017-08-09 NOTE — Patient Instructions (Signed)
Script refill clonazepam to continue using as needed, as we discussed  Please call if we can help

## 2017-08-13 ENCOUNTER — Encounter: Payer: Self-pay | Admitting: Internal Medicine

## 2017-08-13 NOTE — Assessment & Plan Note (Signed)
Pain is no longer obvious issue. She admits trouble unwinding at bedtime. We discussed this. Plan-refill clonazepam for occasional use as discussed.

## 2017-08-13 NOTE — Assessment & Plan Note (Signed)
Seasonal allergic rhinitis doesn't interfere with sleep much. She continues to follow with Dr. Donneta Romberg.

## 2017-11-08 DIAGNOSIS — J301 Allergic rhinitis due to pollen: Secondary | ICD-10-CM | POA: Diagnosis not present

## 2017-11-08 DIAGNOSIS — J3089 Other allergic rhinitis: Secondary | ICD-10-CM | POA: Diagnosis not present

## 2017-11-13 DIAGNOSIS — J3089 Other allergic rhinitis: Secondary | ICD-10-CM | POA: Diagnosis not present

## 2017-11-13 DIAGNOSIS — J301 Allergic rhinitis due to pollen: Secondary | ICD-10-CM | POA: Diagnosis not present

## 2017-11-14 DIAGNOSIS — M5134 Other intervertebral disc degeneration, thoracic region: Secondary | ICD-10-CM | POA: Diagnosis not present

## 2017-11-14 DIAGNOSIS — M50322 Other cervical disc degeneration at C5-C6 level: Secondary | ICD-10-CM | POA: Diagnosis not present

## 2017-11-14 DIAGNOSIS — M9902 Segmental and somatic dysfunction of thoracic region: Secondary | ICD-10-CM | POA: Diagnosis not present

## 2017-11-14 DIAGNOSIS — M9901 Segmental and somatic dysfunction of cervical region: Secondary | ICD-10-CM | POA: Diagnosis not present

## 2017-11-20 DIAGNOSIS — J301 Allergic rhinitis due to pollen: Secondary | ICD-10-CM | POA: Diagnosis not present

## 2017-11-20 DIAGNOSIS — J3089 Other allergic rhinitis: Secondary | ICD-10-CM | POA: Diagnosis not present

## 2017-11-27 DIAGNOSIS — J301 Allergic rhinitis due to pollen: Secondary | ICD-10-CM | POA: Diagnosis not present

## 2017-11-27 DIAGNOSIS — J3089 Other allergic rhinitis: Secondary | ICD-10-CM | POA: Diagnosis not present

## 2017-11-28 DIAGNOSIS — M5134 Other intervertebral disc degeneration, thoracic region: Secondary | ICD-10-CM | POA: Diagnosis not present

## 2017-11-28 DIAGNOSIS — M50322 Other cervical disc degeneration at C5-C6 level: Secondary | ICD-10-CM | POA: Diagnosis not present

## 2017-11-28 DIAGNOSIS — M9902 Segmental and somatic dysfunction of thoracic region: Secondary | ICD-10-CM | POA: Diagnosis not present

## 2017-11-28 DIAGNOSIS — M9901 Segmental and somatic dysfunction of cervical region: Secondary | ICD-10-CM | POA: Diagnosis not present

## 2017-12-04 DIAGNOSIS — J301 Allergic rhinitis due to pollen: Secondary | ICD-10-CM | POA: Diagnosis not present

## 2017-12-04 DIAGNOSIS — J3089 Other allergic rhinitis: Secondary | ICD-10-CM | POA: Diagnosis not present

## 2017-12-11 DIAGNOSIS — J3089 Other allergic rhinitis: Secondary | ICD-10-CM | POA: Diagnosis not present

## 2017-12-11 DIAGNOSIS — J301 Allergic rhinitis due to pollen: Secondary | ICD-10-CM | POA: Diagnosis not present

## 2017-12-20 DIAGNOSIS — J3089 Other allergic rhinitis: Secondary | ICD-10-CM | POA: Diagnosis not present

## 2017-12-20 DIAGNOSIS — J301 Allergic rhinitis due to pollen: Secondary | ICD-10-CM | POA: Diagnosis not present

## 2017-12-25 DIAGNOSIS — J301 Allergic rhinitis due to pollen: Secondary | ICD-10-CM | POA: Diagnosis not present

## 2017-12-25 DIAGNOSIS — J3089 Other allergic rhinitis: Secondary | ICD-10-CM | POA: Diagnosis not present

## 2018-01-01 DIAGNOSIS — J3089 Other allergic rhinitis: Secondary | ICD-10-CM | POA: Diagnosis not present

## 2018-01-01 DIAGNOSIS — J301 Allergic rhinitis due to pollen: Secondary | ICD-10-CM | POA: Diagnosis not present

## 2018-01-02 DIAGNOSIS — J301 Allergic rhinitis due to pollen: Secondary | ICD-10-CM | POA: Diagnosis not present

## 2018-01-02 DIAGNOSIS — J3089 Other allergic rhinitis: Secondary | ICD-10-CM | POA: Diagnosis not present

## 2018-01-08 DIAGNOSIS — J3089 Other allergic rhinitis: Secondary | ICD-10-CM | POA: Diagnosis not present

## 2018-01-08 DIAGNOSIS — J301 Allergic rhinitis due to pollen: Secondary | ICD-10-CM | POA: Diagnosis not present

## 2018-01-10 DIAGNOSIS — M9901 Segmental and somatic dysfunction of cervical region: Secondary | ICD-10-CM | POA: Diagnosis not present

## 2018-01-10 DIAGNOSIS — M5134 Other intervertebral disc degeneration, thoracic region: Secondary | ICD-10-CM | POA: Diagnosis not present

## 2018-01-10 DIAGNOSIS — J301 Allergic rhinitis due to pollen: Secondary | ICD-10-CM | POA: Diagnosis not present

## 2018-01-10 DIAGNOSIS — J3089 Other allergic rhinitis: Secondary | ICD-10-CM | POA: Diagnosis not present

## 2018-01-10 DIAGNOSIS — H1013 Acute atopic conjunctivitis, bilateral: Secondary | ICD-10-CM | POA: Diagnosis not present

## 2018-01-10 DIAGNOSIS — M9902 Segmental and somatic dysfunction of thoracic region: Secondary | ICD-10-CM | POA: Diagnosis not present

## 2018-01-10 DIAGNOSIS — M50322 Other cervical disc degeneration at C5-C6 level: Secondary | ICD-10-CM | POA: Diagnosis not present

## 2018-01-15 DIAGNOSIS — J3089 Other allergic rhinitis: Secondary | ICD-10-CM | POA: Diagnosis not present

## 2018-01-15 DIAGNOSIS — J301 Allergic rhinitis due to pollen: Secondary | ICD-10-CM | POA: Diagnosis not present

## 2018-01-22 DIAGNOSIS — J301 Allergic rhinitis due to pollen: Secondary | ICD-10-CM | POA: Diagnosis not present

## 2018-01-22 DIAGNOSIS — J3089 Other allergic rhinitis: Secondary | ICD-10-CM | POA: Diagnosis not present

## 2018-01-24 DIAGNOSIS — J301 Allergic rhinitis due to pollen: Secondary | ICD-10-CM | POA: Diagnosis not present

## 2018-01-25 DIAGNOSIS — M50322 Other cervical disc degeneration at C5-C6 level: Secondary | ICD-10-CM | POA: Diagnosis not present

## 2018-01-25 DIAGNOSIS — M9901 Segmental and somatic dysfunction of cervical region: Secondary | ICD-10-CM | POA: Diagnosis not present

## 2018-01-29 DIAGNOSIS — D1801 Hemangioma of skin and subcutaneous tissue: Secondary | ICD-10-CM | POA: Diagnosis not present

## 2018-01-29 DIAGNOSIS — Z85820 Personal history of malignant melanoma of skin: Secondary | ICD-10-CM | POA: Diagnosis not present

## 2018-01-29 DIAGNOSIS — J301 Allergic rhinitis due to pollen: Secondary | ICD-10-CM | POA: Diagnosis not present

## 2018-01-29 DIAGNOSIS — L738 Other specified follicular disorders: Secondary | ICD-10-CM | POA: Diagnosis not present

## 2018-01-29 DIAGNOSIS — L720 Epidermal cyst: Secondary | ICD-10-CM | POA: Diagnosis not present

## 2018-01-29 DIAGNOSIS — D225 Melanocytic nevi of trunk: Secondary | ICD-10-CM | POA: Diagnosis not present

## 2018-01-29 DIAGNOSIS — L821 Other seborrheic keratosis: Secondary | ICD-10-CM | POA: Diagnosis not present

## 2018-01-29 DIAGNOSIS — J3089 Other allergic rhinitis: Secondary | ICD-10-CM | POA: Diagnosis not present

## 2018-01-29 DIAGNOSIS — D485 Neoplasm of uncertain behavior of skin: Secondary | ICD-10-CM | POA: Diagnosis not present

## 2018-01-29 DIAGNOSIS — D2271 Melanocytic nevi of right lower limb, including hip: Secondary | ICD-10-CM | POA: Diagnosis not present

## 2018-01-31 DIAGNOSIS — J3089 Other allergic rhinitis: Secondary | ICD-10-CM | POA: Diagnosis not present

## 2018-01-31 DIAGNOSIS — J301 Allergic rhinitis due to pollen: Secondary | ICD-10-CM | POA: Diagnosis not present

## 2018-02-05 DIAGNOSIS — J309 Allergic rhinitis, unspecified: Secondary | ICD-10-CM | POA: Diagnosis not present

## 2018-02-05 DIAGNOSIS — J45909 Unspecified asthma, uncomplicated: Secondary | ICD-10-CM | POA: Diagnosis not present

## 2018-02-05 DIAGNOSIS — H109 Unspecified conjunctivitis: Secondary | ICD-10-CM | POA: Diagnosis not present

## 2018-02-14 DIAGNOSIS — J3089 Other allergic rhinitis: Secondary | ICD-10-CM | POA: Diagnosis not present

## 2018-02-14 DIAGNOSIS — J301 Allergic rhinitis due to pollen: Secondary | ICD-10-CM | POA: Diagnosis not present

## 2018-02-19 DIAGNOSIS — M9901 Segmental and somatic dysfunction of cervical region: Secondary | ICD-10-CM | POA: Diagnosis not present

## 2018-02-19 DIAGNOSIS — J3089 Other allergic rhinitis: Secondary | ICD-10-CM | POA: Diagnosis not present

## 2018-02-19 DIAGNOSIS — M50322 Other cervical disc degeneration at C5-C6 level: Secondary | ICD-10-CM | POA: Diagnosis not present

## 2018-02-19 DIAGNOSIS — J301 Allergic rhinitis due to pollen: Secondary | ICD-10-CM | POA: Diagnosis not present

## 2018-02-21 ENCOUNTER — Telehealth: Payer: Self-pay | Admitting: Internal Medicine

## 2018-02-21 DIAGNOSIS — J3089 Other allergic rhinitis: Secondary | ICD-10-CM | POA: Diagnosis not present

## 2018-02-21 DIAGNOSIS — J301 Allergic rhinitis due to pollen: Secondary | ICD-10-CM | POA: Diagnosis not present

## 2018-02-21 MED ORDER — CLONAZEPAM 0.5 MG PO TABS: ORAL_TABLET | ORAL | 5 refills | Status: DC

## 2018-02-21 NOTE — Telephone Encounter (Signed)
Pt is calling back [phone]

## 2018-02-21 NOTE — Telephone Encounter (Signed)
Ok to refill total 6 months 

## 2018-02-21 NOTE — Telephone Encounter (Signed)
Attempted to call the pt. I did not receive an answer. I have left a message for the pt to return our call.  

## 2018-02-21 NOTE — Telephone Encounter (Signed)
Spoke with pt. She is aware that I will call in this prescription for her. Rx has been called in. Nothing further was needed.

## 2018-02-21 NOTE — Telephone Encounter (Signed)
Pt is requesting a refill on Clonazepam 0.5mg . Her last OV with Dr. Annamaria Boots was on 08/09/17. She has a pending OV with Dr. Annamaria Boots on 08/15/18. The last refill on Clonazepam was on 08/09/17 #50 with 5 additional refills. Pt requests that this refill also have 5 additional refills.  Dr. Annamaria Boots - please advise on refill. Thanks!

## 2018-02-26 DIAGNOSIS — J3089 Other allergic rhinitis: Secondary | ICD-10-CM | POA: Diagnosis not present

## 2018-02-26 DIAGNOSIS — J301 Allergic rhinitis due to pollen: Secondary | ICD-10-CM | POA: Diagnosis not present

## 2018-02-28 DIAGNOSIS — J3089 Other allergic rhinitis: Secondary | ICD-10-CM | POA: Diagnosis not present

## 2018-02-28 DIAGNOSIS — J301 Allergic rhinitis due to pollen: Secondary | ICD-10-CM | POA: Diagnosis not present

## 2018-03-05 DIAGNOSIS — J3089 Other allergic rhinitis: Secondary | ICD-10-CM | POA: Diagnosis not present

## 2018-03-05 DIAGNOSIS — J301 Allergic rhinitis due to pollen: Secondary | ICD-10-CM | POA: Diagnosis not present

## 2018-03-07 DIAGNOSIS — J3089 Other allergic rhinitis: Secondary | ICD-10-CM | POA: Diagnosis not present

## 2018-03-07 DIAGNOSIS — J301 Allergic rhinitis due to pollen: Secondary | ICD-10-CM | POA: Diagnosis not present

## 2018-03-08 DIAGNOSIS — M9901 Segmental and somatic dysfunction of cervical region: Secondary | ICD-10-CM | POA: Diagnosis not present

## 2018-03-08 DIAGNOSIS — M50322 Other cervical disc degeneration at C5-C6 level: Secondary | ICD-10-CM | POA: Diagnosis not present

## 2018-03-14 DIAGNOSIS — J301 Allergic rhinitis due to pollen: Secondary | ICD-10-CM | POA: Diagnosis not present

## 2018-03-14 DIAGNOSIS — J3089 Other allergic rhinitis: Secondary | ICD-10-CM | POA: Diagnosis not present

## 2018-03-15 DIAGNOSIS — M9901 Segmental and somatic dysfunction of cervical region: Secondary | ICD-10-CM | POA: Diagnosis not present

## 2018-03-15 DIAGNOSIS — M50322 Other cervical disc degeneration at C5-C6 level: Secondary | ICD-10-CM | POA: Diagnosis not present

## 2018-03-19 DIAGNOSIS — J301 Allergic rhinitis due to pollen: Secondary | ICD-10-CM | POA: Diagnosis not present

## 2018-03-19 DIAGNOSIS — J3089 Other allergic rhinitis: Secondary | ICD-10-CM | POA: Diagnosis not present

## 2018-03-20 DIAGNOSIS — K120 Recurrent oral aphthae: Secondary | ICD-10-CM | POA: Diagnosis not present

## 2018-03-26 DIAGNOSIS — J301 Allergic rhinitis due to pollen: Secondary | ICD-10-CM | POA: Diagnosis not present

## 2018-03-26 DIAGNOSIS — J3089 Other allergic rhinitis: Secondary | ICD-10-CM | POA: Diagnosis not present

## 2018-03-28 DIAGNOSIS — J3089 Other allergic rhinitis: Secondary | ICD-10-CM | POA: Diagnosis not present

## 2018-04-04 DIAGNOSIS — J3089 Other allergic rhinitis: Secondary | ICD-10-CM | POA: Diagnosis not present

## 2018-04-04 DIAGNOSIS — H1013 Acute atopic conjunctivitis, bilateral: Secondary | ICD-10-CM | POA: Diagnosis not present

## 2018-04-04 DIAGNOSIS — J301 Allergic rhinitis due to pollen: Secondary | ICD-10-CM | POA: Diagnosis not present

## 2018-04-09 DIAGNOSIS — J3089 Other allergic rhinitis: Secondary | ICD-10-CM | POA: Diagnosis not present

## 2018-04-09 DIAGNOSIS — J301 Allergic rhinitis due to pollen: Secondary | ICD-10-CM | POA: Diagnosis not present

## 2018-04-16 DIAGNOSIS — J301 Allergic rhinitis due to pollen: Secondary | ICD-10-CM | POA: Diagnosis not present

## 2018-04-16 DIAGNOSIS — M5031 Other cervical disc degeneration,  high cervical region: Secondary | ICD-10-CM | POA: Diagnosis not present

## 2018-04-16 DIAGNOSIS — M0000 Staphylococcal arthritis, unspecified joint: Secondary | ICD-10-CM | POA: Diagnosis not present

## 2018-04-16 DIAGNOSIS — J3089 Other allergic rhinitis: Secondary | ICD-10-CM | POA: Diagnosis not present

## 2018-04-16 DIAGNOSIS — M9903 Segmental and somatic dysfunction of lumbar region: Secondary | ICD-10-CM | POA: Diagnosis not present

## 2018-04-16 DIAGNOSIS — M5136 Other intervertebral disc degeneration, lumbar region: Secondary | ICD-10-CM | POA: Diagnosis not present

## 2018-05-07 DIAGNOSIS — J301 Allergic rhinitis due to pollen: Secondary | ICD-10-CM | POA: Diagnosis not present

## 2018-05-07 DIAGNOSIS — J3089 Other allergic rhinitis: Secondary | ICD-10-CM | POA: Diagnosis not present

## 2018-05-14 DIAGNOSIS — J301 Allergic rhinitis due to pollen: Secondary | ICD-10-CM | POA: Diagnosis not present

## 2018-05-14 DIAGNOSIS — J3089 Other allergic rhinitis: Secondary | ICD-10-CM | POA: Diagnosis not present

## 2018-05-21 DIAGNOSIS — J3089 Other allergic rhinitis: Secondary | ICD-10-CM | POA: Diagnosis not present

## 2018-05-21 DIAGNOSIS — M5031 Other cervical disc degeneration,  high cervical region: Secondary | ICD-10-CM | POA: Diagnosis not present

## 2018-05-21 DIAGNOSIS — M5136 Other intervertebral disc degeneration, lumbar region: Secondary | ICD-10-CM | POA: Diagnosis not present

## 2018-05-21 DIAGNOSIS — M9903 Segmental and somatic dysfunction of lumbar region: Secondary | ICD-10-CM | POA: Diagnosis not present

## 2018-05-21 DIAGNOSIS — M0000 Staphylococcal arthritis, unspecified joint: Secondary | ICD-10-CM | POA: Diagnosis not present

## 2018-05-21 DIAGNOSIS — J301 Allergic rhinitis due to pollen: Secondary | ICD-10-CM | POA: Diagnosis not present

## 2018-05-30 DIAGNOSIS — J301 Allergic rhinitis due to pollen: Secondary | ICD-10-CM | POA: Diagnosis not present

## 2018-05-30 DIAGNOSIS — J3089 Other allergic rhinitis: Secondary | ICD-10-CM | POA: Diagnosis not present

## 2018-05-31 ENCOUNTER — Other Ambulatory Visit: Payer: Self-pay | Admitting: Obstetrics and Gynecology

## 2018-05-31 DIAGNOSIS — Z1231 Encounter for screening mammogram for malignant neoplasm of breast: Secondary | ICD-10-CM

## 2018-06-04 DIAGNOSIS — J301 Allergic rhinitis due to pollen: Secondary | ICD-10-CM | POA: Diagnosis not present

## 2018-06-04 DIAGNOSIS — J3089 Other allergic rhinitis: Secondary | ICD-10-CM | POA: Diagnosis not present

## 2018-06-05 DIAGNOSIS — M9901 Segmental and somatic dysfunction of cervical region: Secondary | ICD-10-CM | POA: Diagnosis not present

## 2018-06-05 DIAGNOSIS — M5031 Other cervical disc degeneration,  high cervical region: Secondary | ICD-10-CM | POA: Diagnosis not present

## 2018-06-05 DIAGNOSIS — M9903 Segmental and somatic dysfunction of lumbar region: Secondary | ICD-10-CM | POA: Diagnosis not present

## 2018-06-05 DIAGNOSIS — M5136 Other intervertebral disc degeneration, lumbar region: Secondary | ICD-10-CM | POA: Diagnosis not present

## 2018-06-11 DIAGNOSIS — J3089 Other allergic rhinitis: Secondary | ICD-10-CM | POA: Diagnosis not present

## 2018-06-11 DIAGNOSIS — J301 Allergic rhinitis due to pollen: Secondary | ICD-10-CM | POA: Diagnosis not present

## 2018-06-13 DIAGNOSIS — J301 Allergic rhinitis due to pollen: Secondary | ICD-10-CM | POA: Diagnosis not present

## 2018-06-13 DIAGNOSIS — J4520 Mild intermittent asthma, uncomplicated: Secondary | ICD-10-CM | POA: Diagnosis not present

## 2018-06-13 DIAGNOSIS — H1045 Other chronic allergic conjunctivitis: Secondary | ICD-10-CM | POA: Diagnosis not present

## 2018-06-13 DIAGNOSIS — J3089 Other allergic rhinitis: Secondary | ICD-10-CM | POA: Diagnosis not present

## 2018-06-18 DIAGNOSIS — J301 Allergic rhinitis due to pollen: Secondary | ICD-10-CM | POA: Diagnosis not present

## 2018-06-18 DIAGNOSIS — J3089 Other allergic rhinitis: Secondary | ICD-10-CM | POA: Diagnosis not present

## 2018-06-21 DIAGNOSIS — J301 Allergic rhinitis due to pollen: Secondary | ICD-10-CM | POA: Diagnosis not present

## 2018-06-25 DIAGNOSIS — M9903 Segmental and somatic dysfunction of lumbar region: Secondary | ICD-10-CM | POA: Diagnosis not present

## 2018-06-25 DIAGNOSIS — J3089 Other allergic rhinitis: Secondary | ICD-10-CM | POA: Diagnosis not present

## 2018-06-25 DIAGNOSIS — M9901 Segmental and somatic dysfunction of cervical region: Secondary | ICD-10-CM | POA: Diagnosis not present

## 2018-06-25 DIAGNOSIS — M5136 Other intervertebral disc degeneration, lumbar region: Secondary | ICD-10-CM | POA: Diagnosis not present

## 2018-06-25 DIAGNOSIS — J301 Allergic rhinitis due to pollen: Secondary | ICD-10-CM | POA: Diagnosis not present

## 2018-06-25 DIAGNOSIS — M5031 Other cervical disc degeneration,  high cervical region: Secondary | ICD-10-CM | POA: Diagnosis not present

## 2018-07-02 DIAGNOSIS — J301 Allergic rhinitis due to pollen: Secondary | ICD-10-CM | POA: Diagnosis not present

## 2018-07-02 DIAGNOSIS — J3089 Other allergic rhinitis: Secondary | ICD-10-CM | POA: Diagnosis not present

## 2018-07-11 DIAGNOSIS — J3089 Other allergic rhinitis: Secondary | ICD-10-CM | POA: Diagnosis not present

## 2018-07-11 DIAGNOSIS — J301 Allergic rhinitis due to pollen: Secondary | ICD-10-CM | POA: Diagnosis not present

## 2018-07-18 DIAGNOSIS — J3089 Other allergic rhinitis: Secondary | ICD-10-CM | POA: Diagnosis not present

## 2018-07-18 DIAGNOSIS — J301 Allergic rhinitis due to pollen: Secondary | ICD-10-CM | POA: Diagnosis not present

## 2018-07-25 DIAGNOSIS — J3089 Other allergic rhinitis: Secondary | ICD-10-CM | POA: Diagnosis not present

## 2018-07-25 DIAGNOSIS — J301 Allergic rhinitis due to pollen: Secondary | ICD-10-CM | POA: Diagnosis not present

## 2018-07-25 DIAGNOSIS — M9901 Segmental and somatic dysfunction of cervical region: Secondary | ICD-10-CM | POA: Diagnosis not present

## 2018-07-25 DIAGNOSIS — M5031 Other cervical disc degeneration,  high cervical region: Secondary | ICD-10-CM | POA: Diagnosis not present

## 2018-07-26 ENCOUNTER — Telehealth: Payer: Self-pay | Admitting: Podiatry

## 2018-07-26 NOTE — Telephone Encounter (Signed)
Pt sprained her ankle a few days ago and she has some swelling and wanted to know does she need to do anything else besides icing it and wearing compression socks. She has an appointment with Dr. Milinda Pointer on 08/07/2018 @ 1:45pm. Please giver her a call.

## 2018-07-26 NOTE — Telephone Encounter (Signed)
I told pt that the rest, ice and compression she stated she was doing was great, also add the surgical sandal from her previous office visit, decrease her activity to only ADL and I would put her on the cancellation list. Pt is put on the cancellation schedule by A. Hatton.

## 2018-07-26 NOTE — Telephone Encounter (Signed)
Unable to leave a message home phone number is out of service.

## 2018-07-30 ENCOUNTER — Ambulatory Visit
Admission: RE | Admit: 2018-07-30 | Discharge: 2018-07-30 | Disposition: A | Discharge status: Home / Self Care | Payer: Medicare HMO | Source: Ambulatory Visit | Attending: Obstetrics and Gynecology | Admitting: Obstetrics and Gynecology

## 2018-07-30 DIAGNOSIS — J3089 Other allergic rhinitis: Secondary | ICD-10-CM | POA: Diagnosis not present

## 2018-07-30 DIAGNOSIS — Z1231 Encounter for screening mammogram for malignant neoplasm of breast: Secondary | ICD-10-CM

## 2018-08-01 DIAGNOSIS — J3089 Other allergic rhinitis: Secondary | ICD-10-CM | POA: Diagnosis not present

## 2018-08-01 DIAGNOSIS — J301 Allergic rhinitis due to pollen: Secondary | ICD-10-CM | POA: Diagnosis not present

## 2018-08-03 DIAGNOSIS — Z23 Encounter for immunization: Secondary | ICD-10-CM | POA: Diagnosis not present

## 2018-08-07 ENCOUNTER — Ambulatory Visit: Payer: Medicare HMO | Admitting: Podiatry

## 2018-08-07 ENCOUNTER — Encounter: Payer: Self-pay | Admitting: Podiatry

## 2018-08-07 ENCOUNTER — Encounter

## 2018-08-07 ENCOUNTER — Ambulatory Visit (INDEPENDENT_AMBULATORY_CARE_PROVIDER_SITE_OTHER): Payer: Medicare HMO

## 2018-08-07 DIAGNOSIS — S93401A Sprain of unspecified ligament of right ankle, initial encounter: Secondary | ICD-10-CM

## 2018-08-08 DIAGNOSIS — J301 Allergic rhinitis due to pollen: Secondary | ICD-10-CM | POA: Diagnosis not present

## 2018-08-08 DIAGNOSIS — J3089 Other allergic rhinitis: Secondary | ICD-10-CM | POA: Diagnosis not present

## 2018-08-08 NOTE — Progress Notes (Signed)
She presents today chief complaint of pain to the lateral ankles started recently she says the lateral side of the right ankle was bothering about 2 weeks ago now it has resolved.  Objective: Vital signs are stable she is alert and oriented x3.  Pulses are palpable.  Neurologic sensorium is intact.  Degenerative flexors are intact.  Muscle strength normal symmetrical bilateral.  She has tenderness on palpation of the lateral ankle and lateral foot left greater than right.  Assessment: Sprain of the foot.  Plan:Placed her in a compression anklet encouraged her to ice and elevate follow-up with me if symptoms do not improve.

## 2018-08-15 ENCOUNTER — Ambulatory Visit: Payer: BLUE CROSS/BLUE SHIELD | Admitting: Internal Medicine

## 2018-08-15 ENCOUNTER — Encounter: Payer: Self-pay | Admitting: Internal Medicine

## 2018-08-15 DIAGNOSIS — J3089 Other allergic rhinitis: Secondary | ICD-10-CM | POA: Diagnosis not present

## 2018-08-15 DIAGNOSIS — R69 Illness, unspecified: Secondary | ICD-10-CM | POA: Diagnosis not present

## 2018-08-15 DIAGNOSIS — F5104 Psychophysiologic insomnia: Secondary | ICD-10-CM | POA: Diagnosis not present

## 2018-08-15 DIAGNOSIS — J302 Other seasonal allergic rhinitis: Secondary | ICD-10-CM | POA: Diagnosis not present

## 2018-08-15 DIAGNOSIS — J301 Allergic rhinitis due to pollen: Secondary | ICD-10-CM | POA: Diagnosis not present

## 2018-08-15 MED ORDER — CLONAZEPAM 0.5 MG PO TABS: ORAL_TABLET | ORAL | 5 refills | Status: DC

## 2018-08-15 NOTE — Progress Notes (Signed)
HPI female former smoker followed for chronic insomnia since fall with hand injury/reflex sympathetic dystrophy., Complicated by allergic rhinitis (Dr. Donneta Romberg), GERD Says she failed trazodone and Lunesta. She is afraid of what she has heard about Ambien side effects/sleepwalking her zaleplon "relaxes her" so she can go to sleep but often lets her wake during the night.   ----------------------------------------------------------------------------------.  08/09/17- 72 year old female former smoker followed for chronic insomnia since fall with hand injury/reflex sympathetic dystrophy., Complicated by allergic rhinitis (Dr. Donneta Romberg) FOLLOWS FOR- Chronic Insomnia. Pt has not been sleeping lately.  WTW on flu vax Clonazepam 0.5 mg 1-3/at bedtime does work, used 2 or 3 times per week. She feels she is doing well enough, just putting up with her insomnia. Denies problems with daytime sleepiness. Basic pattern hasn't changed but she has gotten used to it.  08/15/2018- 72 year old female former smoker followed for chronic insomnia since fall with hand injury/reflex sympathetic dystrophy., Complicated by allergic rhinitis (Dr. Donneta Romberg), GERD -----Chronic Insomina: Pt states Clonazepam is helping her sleep but has issues with allergies and discomfort of torn tendon in foot  Failed trazodone, Lunesta.  Uncomfortable with reported Ambien side effects/sleepwalking.  Zaleplon did not last long enough. Sleep is okay with clonazepam (1 tab).  Sleep quality is affected by discomfort of her allergic rhinitis and arthritis symptoms.  Daytime sleepiness is not often an issue. Chlorpheniramine 4 mg is usually well-tolerated without adding additional sleepiness when needed for rhinitis.    ROS-see HPI + = positive Constitutional:   No-   weight loss, night sweats, fevers, chills, fatigue, lassitude. HEENT:   + headaches, no-difficulty swallowing, tooth/dental problems, sore throat,       No-  sneezing, itching, ear ache,  nasal congestion, post nasal drip,  CV:  No-   chest pain, orthopnea, PND, +swelling in lower extremities, anasarca,  dizziness, palpitations Resp: No-   shortness of breath with exertion or at rest.              No-   productive cough,  No non-productive cough,  No- coughing up of blood.              No-   change in color of mucus.  No- wheezing.   Skin: No-   rash or lesions. GI:  No-   heartburn, indigestion, abdominal pain, nausea, vomiting,  GU:  MS:  No-   joint pain or swelling.   Neuro-     nothing unusual Psych:  No- change in mood or affect. No depression or anxiety.  No memory loss.  OBJ- Physical Exam General- Alert, Oriented, Affect-appropriate, Distress- none acute. Appears well.  Skin- rash-none, lesions- none, excoriation- none Lymphadenopathy- none Head- atraumatic            Eyes- Gross vision intact, PERRLA, conjunctivae and secretions clear            Ears- Hearing, canals-normal            Nose- Clear, no-Septal dev, mucus, polyps, erosion, perforation             Throat- Mallampati II , mucosa clear , drainage- none, tonsils- atrophic Neck- flexible , trachea midline, no stridor , thyroid nl, carotid no bruit Chest - symmetrical excursion , unlabored           Heart/CV- RRR , no murmur , no gallop  , no rub, nl s1 s2                           -  JVD- none , edema- none, stasis changes- none, varices- none           Lung- clear to P&A, wheeze- none, cough- none , dullness-none, rub- none           Chest wall-  Abd-  Br/ Gen/ Rectal- Not done, not indicated Extrem- cyanosis- none, clubbing, none, atrophy- none, strength- nl Neuro- grossly intact to observation

## 2018-08-15 NOTE — Patient Instructions (Signed)
Script printed for clonazepam for sleep   Please call if we can help

## 2018-08-21 NOTE — Assessment & Plan Note (Signed)
Primary management by her allergist.  We discussed use of chlorpheniramine.

## 2018-08-21 NOTE — Assessment & Plan Note (Signed)
She is alert and appropriate today, looking comfortable Plan-continue clonazepam.  Continue good sleep habits.  Watch for sedative interaction between Chlor-Trimeton and clonazepam.

## 2018-08-22 DIAGNOSIS — J301 Allergic rhinitis due to pollen: Secondary | ICD-10-CM | POA: Diagnosis not present

## 2018-08-22 DIAGNOSIS — N952 Postmenopausal atrophic vaginitis: Secondary | ICD-10-CM | POA: Diagnosis not present

## 2018-08-22 DIAGNOSIS — Z6826 Body mass index (BMI) 26.0-26.9, adult: Secondary | ICD-10-CM | POA: Diagnosis not present

## 2018-08-22 DIAGNOSIS — J3089 Other allergic rhinitis: Secondary | ICD-10-CM | POA: Diagnosis not present

## 2018-08-22 DIAGNOSIS — L904 Acrodermatitis chronica atrophicans: Secondary | ICD-10-CM | POA: Diagnosis not present

## 2018-08-22 DIAGNOSIS — Z01419 Encounter for gynecological examination (general) (routine) without abnormal findings: Secondary | ICD-10-CM | POA: Diagnosis not present

## 2018-08-29 DIAGNOSIS — J3089 Other allergic rhinitis: Secondary | ICD-10-CM | POA: Diagnosis not present

## 2018-08-29 DIAGNOSIS — J301 Allergic rhinitis due to pollen: Secondary | ICD-10-CM | POA: Diagnosis not present

## 2018-09-03 DIAGNOSIS — J301 Allergic rhinitis due to pollen: Secondary | ICD-10-CM | POA: Diagnosis not present

## 2018-09-03 DIAGNOSIS — J3089 Other allergic rhinitis: Secondary | ICD-10-CM | POA: Diagnosis not present

## 2018-09-04 ENCOUNTER — Telehealth: Payer: Self-pay | Admitting: Licensed Clinical Social Worker

## 2018-09-04 NOTE — Telephone Encounter (Signed)
Returned Melanie Evans phone call regarding the cost of genetic testing. After explaining, she decided she would like to come in. She was recommended by her doctor to see [place] because she had [facility] testing done that showed a BRCA1 mutation.

## 2018-09-10 DIAGNOSIS — J301 Allergic rhinitis due to pollen: Secondary | ICD-10-CM | POA: Diagnosis not present

## 2018-09-10 DIAGNOSIS — J3089 Other allergic rhinitis: Secondary | ICD-10-CM | POA: Diagnosis not present

## 2018-09-11 DIAGNOSIS — M9901 Segmental and somatic dysfunction of cervical region: Secondary | ICD-10-CM | POA: Diagnosis not present

## 2018-09-11 DIAGNOSIS — M5031 Other cervical disc degeneration,  high cervical region: Secondary | ICD-10-CM | POA: Diagnosis not present

## 2018-09-17 DIAGNOSIS — J301 Allergic rhinitis due to pollen: Secondary | ICD-10-CM | POA: Diagnosis not present

## 2018-09-17 DIAGNOSIS — J3089 Other allergic rhinitis: Secondary | ICD-10-CM | POA: Diagnosis not present

## 2018-09-26 DIAGNOSIS — J301 Allergic rhinitis due to pollen: Secondary | ICD-10-CM | POA: Diagnosis not present

## 2018-09-26 DIAGNOSIS — J3089 Other allergic rhinitis: Secondary | ICD-10-CM | POA: Diagnosis not present

## 2018-09-26 DIAGNOSIS — E7800 Pure hypercholesterolemia, unspecified: Secondary | ICD-10-CM | POA: Diagnosis not present

## 2018-09-26 DIAGNOSIS — E559 Vitamin D deficiency, unspecified: Secondary | ICD-10-CM | POA: Diagnosis not present

## 2018-09-26 DIAGNOSIS — Z79899 Other long term (current) drug therapy: Secondary | ICD-10-CM | POA: Diagnosis not present

## 2018-10-01 DIAGNOSIS — Z0000 Encounter for general adult medical examination without abnormal findings: Secondary | ICD-10-CM | POA: Diagnosis not present

## 2018-10-01 DIAGNOSIS — J3089 Other allergic rhinitis: Secondary | ICD-10-CM | POA: Diagnosis not present

## 2018-10-01 DIAGNOSIS — J301 Allergic rhinitis due to pollen: Secondary | ICD-10-CM | POA: Diagnosis not present

## 2018-10-03 DIAGNOSIS — D2261 Melanocytic nevi of right upper limb, including shoulder: Secondary | ICD-10-CM | POA: Diagnosis not present

## 2018-10-03 DIAGNOSIS — D2272 Melanocytic nevi of left lower limb, including hip: Secondary | ICD-10-CM | POA: Diagnosis not present

## 2018-10-03 DIAGNOSIS — Z85820 Personal history of malignant melanoma of skin: Secondary | ICD-10-CM | POA: Diagnosis not present

## 2018-10-03 DIAGNOSIS — D1801 Hemangioma of skin and subcutaneous tissue: Secondary | ICD-10-CM | POA: Diagnosis not present

## 2018-10-03 DIAGNOSIS — L821 Other seborrheic keratosis: Secondary | ICD-10-CM | POA: Diagnosis not present

## 2018-10-10 DIAGNOSIS — M79672 Pain in left foot: Secondary | ICD-10-CM | POA: Diagnosis not present

## 2018-10-10 DIAGNOSIS — J3089 Other allergic rhinitis: Secondary | ICD-10-CM | POA: Diagnosis not present

## 2018-10-10 DIAGNOSIS — G5762 Lesion of plantar nerve, left lower limb: Secondary | ICD-10-CM | POA: Diagnosis not present

## 2018-10-10 DIAGNOSIS — J301 Allergic rhinitis due to pollen: Secondary | ICD-10-CM | POA: Diagnosis not present

## 2018-10-11 ENCOUNTER — Other Ambulatory Visit: Payer: Medicare HMO

## 2018-10-11 ENCOUNTER — Encounter: Payer: Medicare HMO | Admitting: Licensed Clinical Social Worker

## 2018-10-16 DIAGNOSIS — R05 Cough: Secondary | ICD-10-CM | POA: Diagnosis not present

## 2018-10-16 DIAGNOSIS — J019 Acute sinusitis, unspecified: Secondary | ICD-10-CM | POA: Diagnosis not present

## 2018-10-17 DIAGNOSIS — J3089 Other allergic rhinitis: Secondary | ICD-10-CM | POA: Diagnosis not present

## 2018-10-17 DIAGNOSIS — J301 Allergic rhinitis due to pollen: Secondary | ICD-10-CM | POA: Diagnosis not present

## 2018-10-17 DIAGNOSIS — M9901 Segmental and somatic dysfunction of cervical region: Secondary | ICD-10-CM | POA: Diagnosis not present

## 2018-10-17 DIAGNOSIS — M5136 Other intervertebral disc degeneration, lumbar region: Secondary | ICD-10-CM | POA: Diagnosis not present

## 2018-10-17 DIAGNOSIS — M9903 Segmental and somatic dysfunction of lumbar region: Secondary | ICD-10-CM | POA: Diagnosis not present

## 2018-10-17 DIAGNOSIS — M5031 Other cervical disc degeneration,  high cervical region: Secondary | ICD-10-CM | POA: Diagnosis not present

## 2018-10-19 DIAGNOSIS — M722 Plantar fascial fibromatosis: Secondary | ICD-10-CM | POA: Diagnosis not present

## 2018-10-22 DIAGNOSIS — J301 Allergic rhinitis due to pollen: Secondary | ICD-10-CM | POA: Diagnosis not present

## 2018-10-22 DIAGNOSIS — J3089 Other allergic rhinitis: Secondary | ICD-10-CM | POA: Diagnosis not present

## 2018-10-29 DIAGNOSIS — J301 Allergic rhinitis due to pollen: Secondary | ICD-10-CM | POA: Diagnosis not present

## 2018-10-29 DIAGNOSIS — J3089 Other allergic rhinitis: Secondary | ICD-10-CM | POA: Diagnosis not present

## 2018-11-12 DIAGNOSIS — J301 Allergic rhinitis due to pollen: Secondary | ICD-10-CM | POA: Diagnosis not present

## 2018-11-12 DIAGNOSIS — J3089 Other allergic rhinitis: Secondary | ICD-10-CM | POA: Diagnosis not present

## 2018-11-19 DIAGNOSIS — M9903 Segmental and somatic dysfunction of lumbar region: Secondary | ICD-10-CM | POA: Diagnosis not present

## 2018-11-19 DIAGNOSIS — M5031 Other cervical disc degeneration,  high cervical region: Secondary | ICD-10-CM | POA: Diagnosis not present

## 2018-11-19 DIAGNOSIS — M5136 Other intervertebral disc degeneration, lumbar region: Secondary | ICD-10-CM | POA: Diagnosis not present

## 2018-11-19 DIAGNOSIS — M9901 Segmental and somatic dysfunction of cervical region: Secondary | ICD-10-CM | POA: Diagnosis not present

## 2018-11-21 DIAGNOSIS — J3089 Other allergic rhinitis: Secondary | ICD-10-CM | POA: Diagnosis not present

## 2018-11-21 DIAGNOSIS — J301 Allergic rhinitis due to pollen: Secondary | ICD-10-CM | POA: Diagnosis not present

## 2018-11-28 DIAGNOSIS — J301 Allergic rhinitis due to pollen: Secondary | ICD-10-CM | POA: Diagnosis not present

## 2018-11-28 DIAGNOSIS — M5136 Other intervertebral disc degeneration, lumbar region: Secondary | ICD-10-CM | POA: Diagnosis not present

## 2018-11-28 DIAGNOSIS — M5031 Other cervical disc degeneration,  high cervical region: Secondary | ICD-10-CM | POA: Diagnosis not present

## 2018-11-28 DIAGNOSIS — M9901 Segmental and somatic dysfunction of cervical region: Secondary | ICD-10-CM | POA: Diagnosis not present

## 2018-11-28 DIAGNOSIS — J3089 Other allergic rhinitis: Secondary | ICD-10-CM | POA: Diagnosis not present

## 2018-11-28 DIAGNOSIS — M9903 Segmental and somatic dysfunction of lumbar region: Secondary | ICD-10-CM | POA: Diagnosis not present

## 2018-12-05 DIAGNOSIS — J3089 Other allergic rhinitis: Secondary | ICD-10-CM | POA: Diagnosis not present

## 2018-12-05 DIAGNOSIS — J301 Allergic rhinitis due to pollen: Secondary | ICD-10-CM | POA: Diagnosis not present

## 2018-12-10 DIAGNOSIS — J3089 Other allergic rhinitis: Secondary | ICD-10-CM | POA: Diagnosis not present

## 2018-12-10 DIAGNOSIS — J301 Allergic rhinitis due to pollen: Secondary | ICD-10-CM | POA: Diagnosis not present

## 2018-12-12 DIAGNOSIS — M722 Plantar fascial fibromatosis: Secondary | ICD-10-CM | POA: Diagnosis not present

## 2018-12-19 DIAGNOSIS — J3089 Other allergic rhinitis: Secondary | ICD-10-CM | POA: Diagnosis not present

## 2018-12-19 DIAGNOSIS — J301 Allergic rhinitis due to pollen: Secondary | ICD-10-CM | POA: Diagnosis not present

## 2018-12-24 DIAGNOSIS — M5136 Other intervertebral disc degeneration, lumbar region: Secondary | ICD-10-CM | POA: Diagnosis not present

## 2018-12-24 DIAGNOSIS — H903 Sensorineural hearing loss, bilateral: Secondary | ICD-10-CM | POA: Diagnosis not present

## 2018-12-24 DIAGNOSIS — M9904 Segmental and somatic dysfunction of sacral region: Secondary | ICD-10-CM | POA: Diagnosis not present

## 2018-12-24 DIAGNOSIS — M9903 Segmental and somatic dysfunction of lumbar region: Secondary | ICD-10-CM | POA: Diagnosis not present

## 2018-12-24 DIAGNOSIS — M9905 Segmental and somatic dysfunction of pelvic region: Secondary | ICD-10-CM | POA: Diagnosis not present

## 2018-12-24 DIAGNOSIS — J301 Allergic rhinitis due to pollen: Secondary | ICD-10-CM | POA: Diagnosis not present

## 2018-12-24 DIAGNOSIS — J3089 Other allergic rhinitis: Secondary | ICD-10-CM | POA: Diagnosis not present

## 2018-12-31 DIAGNOSIS — J3089 Other allergic rhinitis: Secondary | ICD-10-CM | POA: Diagnosis not present

## 2018-12-31 DIAGNOSIS — M5136 Other intervertebral disc degeneration, lumbar region: Secondary | ICD-10-CM | POA: Diagnosis not present

## 2018-12-31 DIAGNOSIS — M9904 Segmental and somatic dysfunction of sacral region: Secondary | ICD-10-CM | POA: Diagnosis not present

## 2018-12-31 DIAGNOSIS — M9903 Segmental and somatic dysfunction of lumbar region: Secondary | ICD-10-CM | POA: Diagnosis not present

## 2018-12-31 DIAGNOSIS — M9905 Segmental and somatic dysfunction of pelvic region: Secondary | ICD-10-CM | POA: Diagnosis not present

## 2018-12-31 DIAGNOSIS — J301 Allergic rhinitis due to pollen: Secondary | ICD-10-CM | POA: Diagnosis not present

## 2019-01-02 DIAGNOSIS — J3089 Other allergic rhinitis: Secondary | ICD-10-CM | POA: Diagnosis not present

## 2019-01-02 DIAGNOSIS — J301 Allergic rhinitis due to pollen: Secondary | ICD-10-CM | POA: Diagnosis not present

## 2019-01-07 DIAGNOSIS — M9905 Segmental and somatic dysfunction of pelvic region: Secondary | ICD-10-CM | POA: Diagnosis not present

## 2019-01-07 DIAGNOSIS — M9903 Segmental and somatic dysfunction of lumbar region: Secondary | ICD-10-CM | POA: Diagnosis not present

## 2019-01-07 DIAGNOSIS — M9904 Segmental and somatic dysfunction of sacral region: Secondary | ICD-10-CM | POA: Diagnosis not present

## 2019-01-07 DIAGNOSIS — M5136 Other intervertebral disc degeneration, lumbar region: Secondary | ICD-10-CM | POA: Diagnosis not present

## 2019-01-09 DIAGNOSIS — J3089 Other allergic rhinitis: Secondary | ICD-10-CM | POA: Diagnosis not present

## 2019-01-09 DIAGNOSIS — J301 Allergic rhinitis due to pollen: Secondary | ICD-10-CM | POA: Diagnosis not present

## 2019-01-16 DIAGNOSIS — J3089 Other allergic rhinitis: Secondary | ICD-10-CM | POA: Diagnosis not present

## 2019-01-16 DIAGNOSIS — J301 Allergic rhinitis due to pollen: Secondary | ICD-10-CM | POA: Diagnosis not present

## 2019-01-21 DIAGNOSIS — M5136 Other intervertebral disc degeneration, lumbar region: Secondary | ICD-10-CM | POA: Diagnosis not present

## 2019-01-21 DIAGNOSIS — M9903 Segmental and somatic dysfunction of lumbar region: Secondary | ICD-10-CM | POA: Diagnosis not present

## 2019-01-21 DIAGNOSIS — M9905 Segmental and somatic dysfunction of pelvic region: Secondary | ICD-10-CM | POA: Diagnosis not present

## 2019-01-21 DIAGNOSIS — M9904 Segmental and somatic dysfunction of sacral region: Secondary | ICD-10-CM | POA: Diagnosis not present

## 2019-01-23 DIAGNOSIS — J4520 Mild intermittent asthma, uncomplicated: Secondary | ICD-10-CM | POA: Diagnosis not present

## 2019-01-23 DIAGNOSIS — H1045 Other chronic allergic conjunctivitis: Secondary | ICD-10-CM | POA: Diagnosis not present

## 2019-01-23 DIAGNOSIS — J301 Allergic rhinitis due to pollen: Secondary | ICD-10-CM | POA: Diagnosis not present

## 2019-01-23 DIAGNOSIS — J3089 Other allergic rhinitis: Secondary | ICD-10-CM | POA: Diagnosis not present

## 2019-01-28 DIAGNOSIS — J3089 Other allergic rhinitis: Secondary | ICD-10-CM | POA: Diagnosis not present

## 2019-01-28 DIAGNOSIS — M9903 Segmental and somatic dysfunction of lumbar region: Secondary | ICD-10-CM | POA: Diagnosis not present

## 2019-01-28 DIAGNOSIS — J301 Allergic rhinitis due to pollen: Secondary | ICD-10-CM | POA: Diagnosis not present

## 2019-01-28 DIAGNOSIS — M9904 Segmental and somatic dysfunction of sacral region: Secondary | ICD-10-CM | POA: Diagnosis not present

## 2019-01-28 DIAGNOSIS — M9905 Segmental and somatic dysfunction of pelvic region: Secondary | ICD-10-CM | POA: Diagnosis not present

## 2019-01-28 DIAGNOSIS — M5136 Other intervertebral disc degeneration, lumbar region: Secondary | ICD-10-CM | POA: Diagnosis not present

## 2019-01-31 DIAGNOSIS — J3089 Other allergic rhinitis: Secondary | ICD-10-CM | POA: Diagnosis not present

## 2019-02-01 ENCOUNTER — Telehealth: Payer: Self-pay | Admitting: Internal Medicine

## 2019-02-01 MED ORDER — CLONAZEPAM 0.5 MG PO TABS: ORAL_TABLET | ORAL | 5 refills | Status: DC

## 2019-02-01 NOTE — Telephone Encounter (Signed)
Clonazepam refill e-sent 

## 2019-02-01 NOTE — Telephone Encounter (Signed)
Called and spoke with pt letting her know that CY sent Rx in to pharmacy for her. Pt expressed understanding. Nothing further needed.

## 2019-02-01 NOTE — Telephone Encounter (Signed)
Spoke with pt. She is needing a refill on Clonazepam. Last OV with CY was on 09/04/2018. Has pending OV with CY on 08/20/2019. Last refill was on 08/15/2018 #50 with 5 additional refills.  CY - please advise on refill. Thanks.

## 2019-02-04 DIAGNOSIS — J3089 Other allergic rhinitis: Secondary | ICD-10-CM | POA: Diagnosis not present

## 2019-02-04 DIAGNOSIS — J301 Allergic rhinitis due to pollen: Secondary | ICD-10-CM | POA: Diagnosis not present

## 2019-02-11 DIAGNOSIS — J3089 Other allergic rhinitis: Secondary | ICD-10-CM | POA: Diagnosis not present

## 2019-02-11 DIAGNOSIS — J301 Allergic rhinitis due to pollen: Secondary | ICD-10-CM | POA: Diagnosis not present

## 2019-02-13 DIAGNOSIS — J3089 Other allergic rhinitis: Secondary | ICD-10-CM | POA: Diagnosis not present

## 2019-02-13 DIAGNOSIS — J301 Allergic rhinitis due to pollen: Secondary | ICD-10-CM | POA: Diagnosis not present

## 2019-02-19 DIAGNOSIS — M9905 Segmental and somatic dysfunction of pelvic region: Secondary | ICD-10-CM | POA: Diagnosis not present

## 2019-02-19 DIAGNOSIS — M5136 Other intervertebral disc degeneration, lumbar region: Secondary | ICD-10-CM | POA: Diagnosis not present

## 2019-02-19 DIAGNOSIS — M9903 Segmental and somatic dysfunction of lumbar region: Secondary | ICD-10-CM | POA: Diagnosis not present

## 2019-02-19 DIAGNOSIS — M9904 Segmental and somatic dysfunction of sacral region: Secondary | ICD-10-CM | POA: Diagnosis not present

## 2019-02-20 DIAGNOSIS — J301 Allergic rhinitis due to pollen: Secondary | ICD-10-CM | POA: Diagnosis not present

## 2019-02-20 DIAGNOSIS — J3089 Other allergic rhinitis: Secondary | ICD-10-CM | POA: Diagnosis not present

## 2019-02-27 DIAGNOSIS — J3089 Other allergic rhinitis: Secondary | ICD-10-CM | POA: Diagnosis not present

## 2019-02-27 DIAGNOSIS — J301 Allergic rhinitis due to pollen: Secondary | ICD-10-CM | POA: Diagnosis not present

## 2019-03-06 DIAGNOSIS — J301 Allergic rhinitis due to pollen: Secondary | ICD-10-CM | POA: Diagnosis not present

## 2019-03-06 DIAGNOSIS — M9903 Segmental and somatic dysfunction of lumbar region: Secondary | ICD-10-CM | POA: Diagnosis not present

## 2019-03-06 DIAGNOSIS — M5136 Other intervertebral disc degeneration, lumbar region: Secondary | ICD-10-CM | POA: Diagnosis not present

## 2019-03-06 DIAGNOSIS — M9904 Segmental and somatic dysfunction of sacral region: Secondary | ICD-10-CM | POA: Diagnosis not present

## 2019-03-06 DIAGNOSIS — M9905 Segmental and somatic dysfunction of pelvic region: Secondary | ICD-10-CM | POA: Diagnosis not present

## 2019-03-06 DIAGNOSIS — J3089 Other allergic rhinitis: Secondary | ICD-10-CM | POA: Diagnosis not present

## 2019-03-11 DIAGNOSIS — J301 Allergic rhinitis due to pollen: Secondary | ICD-10-CM | POA: Diagnosis not present

## 2019-03-11 DIAGNOSIS — J3089 Other allergic rhinitis: Secondary | ICD-10-CM | POA: Diagnosis not present

## 2019-03-13 DIAGNOSIS — H2513 Age-related nuclear cataract, bilateral: Secondary | ICD-10-CM | POA: Diagnosis not present

## 2019-03-20 DIAGNOSIS — J301 Allergic rhinitis due to pollen: Secondary | ICD-10-CM | POA: Diagnosis not present

## 2019-03-20 DIAGNOSIS — J3089 Other allergic rhinitis: Secondary | ICD-10-CM | POA: Diagnosis not present

## 2019-03-25 DIAGNOSIS — J301 Allergic rhinitis due to pollen: Secondary | ICD-10-CM | POA: Diagnosis not present

## 2019-03-25 DIAGNOSIS — J3089 Other allergic rhinitis: Secondary | ICD-10-CM | POA: Diagnosis not present

## 2019-04-03 DIAGNOSIS — J301 Allergic rhinitis due to pollen: Secondary | ICD-10-CM | POA: Diagnosis not present

## 2019-04-03 DIAGNOSIS — J3089 Other allergic rhinitis: Secondary | ICD-10-CM | POA: Diagnosis not present

## 2019-04-09 DIAGNOSIS — M5031 Other cervical disc degeneration,  high cervical region: Secondary | ICD-10-CM | POA: Diagnosis not present

## 2019-04-09 DIAGNOSIS — M9903 Segmental and somatic dysfunction of lumbar region: Secondary | ICD-10-CM | POA: Diagnosis not present

## 2019-04-09 DIAGNOSIS — M5136 Other intervertebral disc degeneration, lumbar region: Secondary | ICD-10-CM | POA: Diagnosis not present

## 2019-04-09 DIAGNOSIS — M9901 Segmental and somatic dysfunction of cervical region: Secondary | ICD-10-CM | POA: Diagnosis not present

## 2019-04-10 DIAGNOSIS — J3089 Other allergic rhinitis: Secondary | ICD-10-CM | POA: Diagnosis not present

## 2019-04-10 DIAGNOSIS — J301 Allergic rhinitis due to pollen: Secondary | ICD-10-CM | POA: Diagnosis not present

## 2019-04-11 DIAGNOSIS — M5031 Other cervical disc degeneration,  high cervical region: Secondary | ICD-10-CM | POA: Diagnosis not present

## 2019-04-11 DIAGNOSIS — M9903 Segmental and somatic dysfunction of lumbar region: Secondary | ICD-10-CM | POA: Diagnosis not present

## 2019-04-11 DIAGNOSIS — M5136 Other intervertebral disc degeneration, lumbar region: Secondary | ICD-10-CM | POA: Diagnosis not present

## 2019-04-11 DIAGNOSIS — M9901 Segmental and somatic dysfunction of cervical region: Secondary | ICD-10-CM | POA: Diagnosis not present

## 2019-04-17 DIAGNOSIS — J3089 Other allergic rhinitis: Secondary | ICD-10-CM | POA: Diagnosis not present

## 2019-04-17 DIAGNOSIS — J301 Allergic rhinitis due to pollen: Secondary | ICD-10-CM | POA: Diagnosis not present

## 2019-04-24 DIAGNOSIS — J3089 Other allergic rhinitis: Secondary | ICD-10-CM | POA: Diagnosis not present

## 2019-04-24 DIAGNOSIS — J301 Allergic rhinitis due to pollen: Secondary | ICD-10-CM | POA: Diagnosis not present

## 2019-04-30 DIAGNOSIS — M9903 Segmental and somatic dysfunction of lumbar region: Secondary | ICD-10-CM | POA: Diagnosis not present

## 2019-04-30 DIAGNOSIS — M5136 Other intervertebral disc degeneration, lumbar region: Secondary | ICD-10-CM | POA: Diagnosis not present

## 2019-04-30 DIAGNOSIS — M9901 Segmental and somatic dysfunction of cervical region: Secondary | ICD-10-CM | POA: Diagnosis not present

## 2019-04-30 DIAGNOSIS — M5031 Other cervical disc degeneration,  high cervical region: Secondary | ICD-10-CM | POA: Diagnosis not present

## 2019-05-01 DIAGNOSIS — J301 Allergic rhinitis due to pollen: Secondary | ICD-10-CM | POA: Diagnosis not present

## 2019-05-01 DIAGNOSIS — J3089 Other allergic rhinitis: Secondary | ICD-10-CM | POA: Diagnosis not present

## 2019-05-02 DIAGNOSIS — M9903 Segmental and somatic dysfunction of lumbar region: Secondary | ICD-10-CM | POA: Diagnosis not present

## 2019-05-02 DIAGNOSIS — M9901 Segmental and somatic dysfunction of cervical region: Secondary | ICD-10-CM | POA: Diagnosis not present

## 2019-05-02 DIAGNOSIS — M5031 Other cervical disc degeneration,  high cervical region: Secondary | ICD-10-CM | POA: Diagnosis not present

## 2019-05-02 DIAGNOSIS — M5136 Other intervertebral disc degeneration, lumbar region: Secondary | ICD-10-CM | POA: Diagnosis not present

## 2019-05-06 DIAGNOSIS — M5136 Other intervertebral disc degeneration, lumbar region: Secondary | ICD-10-CM | POA: Diagnosis not present

## 2019-05-06 DIAGNOSIS — M9903 Segmental and somatic dysfunction of lumbar region: Secondary | ICD-10-CM | POA: Diagnosis not present

## 2019-05-06 DIAGNOSIS — M9901 Segmental and somatic dysfunction of cervical region: Secondary | ICD-10-CM | POA: Diagnosis not present

## 2019-05-06 DIAGNOSIS — M5031 Other cervical disc degeneration,  high cervical region: Secondary | ICD-10-CM | POA: Diagnosis not present

## 2019-05-07 ENCOUNTER — Other Ambulatory Visit: Payer: Self-pay | Admitting: Obstetrics and Gynecology

## 2019-05-07 DIAGNOSIS — M5031 Other cervical disc degeneration,  high cervical region: Secondary | ICD-10-CM | POA: Diagnosis not present

## 2019-05-07 DIAGNOSIS — M9901 Segmental and somatic dysfunction of cervical region: Secondary | ICD-10-CM | POA: Diagnosis not present

## 2019-05-07 DIAGNOSIS — M5136 Other intervertebral disc degeneration, lumbar region: Secondary | ICD-10-CM | POA: Diagnosis not present

## 2019-05-07 DIAGNOSIS — M9903 Segmental and somatic dysfunction of lumbar region: Secondary | ICD-10-CM | POA: Diagnosis not present

## 2019-05-07 DIAGNOSIS — Z1231 Encounter for screening mammogram for malignant neoplasm of breast: Secondary | ICD-10-CM

## 2019-05-08 DIAGNOSIS — J3089 Other allergic rhinitis: Secondary | ICD-10-CM | POA: Diagnosis not present

## 2019-05-08 DIAGNOSIS — J301 Allergic rhinitis due to pollen: Secondary | ICD-10-CM | POA: Diagnosis not present

## 2019-05-09 DIAGNOSIS — M9903 Segmental and somatic dysfunction of lumbar region: Secondary | ICD-10-CM | POA: Diagnosis not present

## 2019-05-09 DIAGNOSIS — M9901 Segmental and somatic dysfunction of cervical region: Secondary | ICD-10-CM | POA: Diagnosis not present

## 2019-05-09 DIAGNOSIS — M5031 Other cervical disc degeneration,  high cervical region: Secondary | ICD-10-CM | POA: Diagnosis not present

## 2019-05-09 DIAGNOSIS — M5136 Other intervertebral disc degeneration, lumbar region: Secondary | ICD-10-CM | POA: Diagnosis not present

## 2019-05-13 DIAGNOSIS — M5136 Other intervertebral disc degeneration, lumbar region: Secondary | ICD-10-CM | POA: Diagnosis not present

## 2019-05-13 DIAGNOSIS — M9903 Segmental and somatic dysfunction of lumbar region: Secondary | ICD-10-CM | POA: Diagnosis not present

## 2019-05-13 DIAGNOSIS — M5031 Other cervical disc degeneration,  high cervical region: Secondary | ICD-10-CM | POA: Diagnosis not present

## 2019-05-13 DIAGNOSIS — M9901 Segmental and somatic dysfunction of cervical region: Secondary | ICD-10-CM | POA: Diagnosis not present

## 2019-05-14 DIAGNOSIS — M9903 Segmental and somatic dysfunction of lumbar region: Secondary | ICD-10-CM | POA: Diagnosis not present

## 2019-05-14 DIAGNOSIS — M9901 Segmental and somatic dysfunction of cervical region: Secondary | ICD-10-CM | POA: Diagnosis not present

## 2019-05-14 DIAGNOSIS — M5031 Other cervical disc degeneration,  high cervical region: Secondary | ICD-10-CM | POA: Diagnosis not present

## 2019-05-14 DIAGNOSIS — M5136 Other intervertebral disc degeneration, lumbar region: Secondary | ICD-10-CM | POA: Diagnosis not present

## 2019-05-15 DIAGNOSIS — J3089 Other allergic rhinitis: Secondary | ICD-10-CM | POA: Diagnosis not present

## 2019-05-15 DIAGNOSIS — J301 Allergic rhinitis due to pollen: Secondary | ICD-10-CM | POA: Diagnosis not present

## 2019-05-16 DIAGNOSIS — M5136 Other intervertebral disc degeneration, lumbar region: Secondary | ICD-10-CM | POA: Diagnosis not present

## 2019-05-16 DIAGNOSIS — M9903 Segmental and somatic dysfunction of lumbar region: Secondary | ICD-10-CM | POA: Diagnosis not present

## 2019-05-16 DIAGNOSIS — M9901 Segmental and somatic dysfunction of cervical region: Secondary | ICD-10-CM | POA: Diagnosis not present

## 2019-05-16 DIAGNOSIS — M5031 Other cervical disc degeneration,  high cervical region: Secondary | ICD-10-CM | POA: Diagnosis not present

## 2019-05-17 DIAGNOSIS — J301 Allergic rhinitis due to pollen: Secondary | ICD-10-CM | POA: Diagnosis not present

## 2019-05-20 DIAGNOSIS — M9903 Segmental and somatic dysfunction of lumbar region: Secondary | ICD-10-CM | POA: Diagnosis not present

## 2019-05-20 DIAGNOSIS — M5136 Other intervertebral disc degeneration, lumbar region: Secondary | ICD-10-CM | POA: Diagnosis not present

## 2019-05-20 DIAGNOSIS — M9901 Segmental and somatic dysfunction of cervical region: Secondary | ICD-10-CM | POA: Diagnosis not present

## 2019-05-20 DIAGNOSIS — M5031 Other cervical disc degeneration,  high cervical region: Secondary | ICD-10-CM | POA: Diagnosis not present

## 2019-05-22 DIAGNOSIS — J301 Allergic rhinitis due to pollen: Secondary | ICD-10-CM | POA: Diagnosis not present

## 2019-05-22 DIAGNOSIS — J3089 Other allergic rhinitis: Secondary | ICD-10-CM | POA: Diagnosis not present

## 2019-05-23 DIAGNOSIS — M5136 Other intervertebral disc degeneration, lumbar region: Secondary | ICD-10-CM | POA: Diagnosis not present

## 2019-05-23 DIAGNOSIS — M9903 Segmental and somatic dysfunction of lumbar region: Secondary | ICD-10-CM | POA: Diagnosis not present

## 2019-05-23 DIAGNOSIS — M9901 Segmental and somatic dysfunction of cervical region: Secondary | ICD-10-CM | POA: Diagnosis not present

## 2019-05-23 DIAGNOSIS — M5031 Other cervical disc degeneration,  high cervical region: Secondary | ICD-10-CM | POA: Diagnosis not present

## 2019-05-29 DIAGNOSIS — J3089 Other allergic rhinitis: Secondary | ICD-10-CM | POA: Diagnosis not present

## 2019-05-29 DIAGNOSIS — J301 Allergic rhinitis due to pollen: Secondary | ICD-10-CM | POA: Diagnosis not present

## 2019-06-05 DIAGNOSIS — J301 Allergic rhinitis due to pollen: Secondary | ICD-10-CM | POA: Diagnosis not present

## 2019-06-05 DIAGNOSIS — J3089 Other allergic rhinitis: Secondary | ICD-10-CM | POA: Diagnosis not present

## 2019-06-10 DIAGNOSIS — R69 Illness, unspecified: Secondary | ICD-10-CM | POA: Diagnosis not present

## 2019-06-11 DIAGNOSIS — M9901 Segmental and somatic dysfunction of cervical region: Secondary | ICD-10-CM | POA: Diagnosis not present

## 2019-06-11 DIAGNOSIS — M5136 Other intervertebral disc degeneration, lumbar region: Secondary | ICD-10-CM | POA: Diagnosis not present

## 2019-06-11 DIAGNOSIS — M9903 Segmental and somatic dysfunction of lumbar region: Secondary | ICD-10-CM | POA: Diagnosis not present

## 2019-06-11 DIAGNOSIS — M5031 Other cervical disc degeneration,  high cervical region: Secondary | ICD-10-CM | POA: Diagnosis not present

## 2019-06-12 DIAGNOSIS — J3089 Other allergic rhinitis: Secondary | ICD-10-CM | POA: Diagnosis not present

## 2019-06-12 DIAGNOSIS — J301 Allergic rhinitis due to pollen: Secondary | ICD-10-CM | POA: Diagnosis not present

## 2019-06-19 DIAGNOSIS — J3089 Other allergic rhinitis: Secondary | ICD-10-CM | POA: Diagnosis not present

## 2019-06-19 DIAGNOSIS — J301 Allergic rhinitis due to pollen: Secondary | ICD-10-CM | POA: Diagnosis not present

## 2019-06-20 DIAGNOSIS — M5136 Other intervertebral disc degeneration, lumbar region: Secondary | ICD-10-CM | POA: Diagnosis not present

## 2019-06-20 DIAGNOSIS — M5031 Other cervical disc degeneration,  high cervical region: Secondary | ICD-10-CM | POA: Diagnosis not present

## 2019-06-20 DIAGNOSIS — M9903 Segmental and somatic dysfunction of lumbar region: Secondary | ICD-10-CM | POA: Diagnosis not present

## 2019-06-20 DIAGNOSIS — M9901 Segmental and somatic dysfunction of cervical region: Secondary | ICD-10-CM | POA: Diagnosis not present

## 2019-06-25 DIAGNOSIS — J3089 Other allergic rhinitis: Secondary | ICD-10-CM | POA: Diagnosis not present

## 2019-06-26 DIAGNOSIS — J301 Allergic rhinitis due to pollen: Secondary | ICD-10-CM | POA: Diagnosis not present

## 2019-06-26 DIAGNOSIS — J3089 Other allergic rhinitis: Secondary | ICD-10-CM | POA: Diagnosis not present

## 2019-07-03 DIAGNOSIS — J301 Allergic rhinitis due to pollen: Secondary | ICD-10-CM | POA: Diagnosis not present

## 2019-07-03 DIAGNOSIS — J3089 Other allergic rhinitis: Secondary | ICD-10-CM | POA: Diagnosis not present

## 2019-07-10 DIAGNOSIS — J3089 Other allergic rhinitis: Secondary | ICD-10-CM | POA: Diagnosis not present

## 2019-07-10 DIAGNOSIS — J301 Allergic rhinitis due to pollen: Secondary | ICD-10-CM | POA: Diagnosis not present

## 2019-07-17 DIAGNOSIS — J3089 Other allergic rhinitis: Secondary | ICD-10-CM | POA: Diagnosis not present

## 2019-07-17 DIAGNOSIS — J301 Allergic rhinitis due to pollen: Secondary | ICD-10-CM | POA: Diagnosis not present

## 2019-07-24 DIAGNOSIS — J3089 Other allergic rhinitis: Secondary | ICD-10-CM | POA: Diagnosis not present

## 2019-07-24 DIAGNOSIS — J301 Allergic rhinitis due to pollen: Secondary | ICD-10-CM | POA: Diagnosis not present

## 2019-07-30 DIAGNOSIS — M5031 Other cervical disc degeneration,  high cervical region: Secondary | ICD-10-CM | POA: Diagnosis not present

## 2019-07-30 DIAGNOSIS — M5136 Other intervertebral disc degeneration, lumbar region: Secondary | ICD-10-CM | POA: Diagnosis not present

## 2019-07-30 DIAGNOSIS — M9901 Segmental and somatic dysfunction of cervical region: Secondary | ICD-10-CM | POA: Diagnosis not present

## 2019-07-30 DIAGNOSIS — M5134 Other intervertebral disc degeneration, thoracic region: Secondary | ICD-10-CM | POA: Diagnosis not present

## 2019-07-31 DIAGNOSIS — J3089 Other allergic rhinitis: Secondary | ICD-10-CM | POA: Diagnosis not present

## 2019-07-31 DIAGNOSIS — J301 Allergic rhinitis due to pollen: Secondary | ICD-10-CM | POA: Diagnosis not present

## 2019-08-02 ENCOUNTER — Ambulatory Visit: Payer: Medicare HMO

## 2019-08-05 DIAGNOSIS — Z23 Encounter for immunization: Secondary | ICD-10-CM | POA: Diagnosis not present

## 2019-08-07 DIAGNOSIS — J3089 Other allergic rhinitis: Secondary | ICD-10-CM | POA: Diagnosis not present

## 2019-08-07 DIAGNOSIS — J301 Allergic rhinitis due to pollen: Secondary | ICD-10-CM | POA: Diagnosis not present

## 2019-08-14 DIAGNOSIS — J3089 Other allergic rhinitis: Secondary | ICD-10-CM | POA: Diagnosis not present

## 2019-08-14 DIAGNOSIS — M9901 Segmental and somatic dysfunction of cervical region: Secondary | ICD-10-CM | POA: Diagnosis not present

## 2019-08-14 DIAGNOSIS — M5136 Other intervertebral disc degeneration, lumbar region: Secondary | ICD-10-CM | POA: Diagnosis not present

## 2019-08-14 DIAGNOSIS — J301 Allergic rhinitis due to pollen: Secondary | ICD-10-CM | POA: Diagnosis not present

## 2019-08-14 DIAGNOSIS — M5031 Other cervical disc degeneration,  high cervical region: Secondary | ICD-10-CM | POA: Diagnosis not present

## 2019-08-14 DIAGNOSIS — M5134 Other intervertebral disc degeneration, thoracic region: Secondary | ICD-10-CM | POA: Diagnosis not present

## 2019-08-20 ENCOUNTER — Ambulatory Visit: Payer: Medicare HMO | Admitting: Internal Medicine

## 2019-08-20 ENCOUNTER — Encounter: Payer: Self-pay | Admitting: Internal Medicine

## 2019-08-20 ENCOUNTER — Other Ambulatory Visit: Payer: Self-pay

## 2019-08-20 DIAGNOSIS — J302 Other seasonal allergic rhinitis: Secondary | ICD-10-CM | POA: Diagnosis not present

## 2019-08-20 DIAGNOSIS — F5104 Psychophysiologic insomnia: Secondary | ICD-10-CM

## 2019-08-20 DIAGNOSIS — J3089 Other allergic rhinitis: Secondary | ICD-10-CM | POA: Diagnosis not present

## 2019-08-20 DIAGNOSIS — R69 Illness, unspecified: Secondary | ICD-10-CM | POA: Diagnosis not present

## 2019-08-20 MED ORDER — CLONAZEPAM 0.5 MG PO TABS: ORAL_TABLET | ORAL | 5 refills | Status: DC

## 2019-08-20 NOTE — Assessment & Plan Note (Signed)
Primary insomnia, magnified by anxiety. Would benefit from getting out of her home more and suggested she wear her covid mask outdoors if concerned about allergen exposure. Plan- continue clonazepam. Ok to take an extra 1/2 pill occasionally. Ok to add melatonin.

## 2019-08-20 NOTE — Assessment & Plan Note (Signed)
Mentions postnasal drip as a concern if she were to walk outside. Discussed masking, antihistamines. She can review these issues with Dr Donneta Romberg.

## 2019-08-20 NOTE — Progress Notes (Signed)
HPI female former smoker followed for chronic insomnia since fall with hand injury/reflex sympathetic dystrophy., Complicated by allergic rhinitis (Dr. Donneta Romberg), GERD Says she failed trazodone and Lunesta. She is afraid of what she has heard about Ambien side effects/sleepwalking her zaleplon "relaxes her" so she can go to sleep but often lets her wake during the night.   ----------------------------------------------------------------------------------.  08/15/2018- 73 year old female former smoker followed for chronic insomnia since fall with hand injury/reflex sympathetic dystrophy., Complicated by allergic rhinitis (Dr. Donneta Romberg), GERD -----Chronic Insomina: Pt states Clonazepam is helping her sleep but has issues with allergies and discomfort of torn tendon in foot  Failed trazodone, Lunesta.  Uncomfortable with reported Ambien side effects/sleepwalking.  Zaleplon did not last long enough. Sleep is okay with clonazepam (1 tab).  Sleep quality is affected by discomfort of her allergic rhinitis and arthritis symptoms.  Daytime sleepiness is not often an issue. Chlorpheniramine 4 mg is usually well-tolerated without adding additional sleepiness when needed for rhinitis.  08/20/2019-  73 year old female former smoker followed for chronic insomnia since fall with hand injury/reflex sympathetic dystrophy., Complicated by allergic rhinitis (Dr. Donneta Romberg), GERD Med Hx: Failed trazodone, Doxepin, Benadryl, temazepam, Lunesta, zaleplon,  Uncomfortable with reported Ambien side effects/sleepwalking.  Zaleplon did not last long enough -----followed for chronic insomnia; pt reports trouble sleeping d/t life events; taking clonazepam nightly  Followed elsewhere for cervical spine level dysfunction. Clonazepam 0.5 mg 1-3 for sleep Discusses stressors in life- downsized to apartment and feels confined when husband is home, with covid precautions. Very light sensitive at night. Has been taking 0.5 mg clonazepam most  nights; may occasionally need more.  This is still the best med for sleep she has used. Discussed ability to add melatonin. Using chlortrimeton for allergic postnasal drip and can discuss more with her allergist.  ROS-see HPI + = positive Constitutional:   No-   weight loss, night sweats, fevers, chills, fatigue, lassitude. HEENT:   + headaches, no-difficulty swallowing, tooth/dental problems, sore throat,       No-  sneezing, itching, ear ache, nasal congestion, post nasal drip,  CV:  No-   chest pain, orthopnea, PND, +swelling in lower extremities, anasarca,  dizziness, palpitations Resp: No-   shortness of breath with exertion or at rest.              No-   productive cough,  No non-productive cough,  No- coughing up of blood.              No-   change in color of mucus.  No- wheezing.   Skin: No-   rash or lesions. GI:  No-   heartburn, indigestion, abdominal pain, nausea, vomiting,  GU:  MS:  No-   joint pain or swelling.   Neuro-     nothing unusual Psych:  No- change in mood or affect. No depression or anxiety.  No memory loss.  OBJ- Physical Exam General- Alert, Oriented, Affect-appropriate, Distress- none acute. Appears well.  Skin- rash-none, lesions- none, excoriation- none Lymphadenopathy- none Head- atraumatic            Eyes- Gross vision intact, PERRLA, conjunctivae and secretions clear            Ears- Hearing, canals-normal            Nose- Clear, no-Septal dev, mucus, polyps, erosion, perforation             Throat- Mallampati II , mucosa clear , drainage- none, tonsils- atrophic Neck- flexible , trachea midline,  no stridor , thyroid nl, carotid no bruit Chest - symmetrical excursion , unlabored           Heart/CV- RRR , no murmur , no gallop  , no rub, nl s1 s2                           - JVD- none , edema- none, stasis changes- none, varices- none           Lung- clear to P&A, wheeze- none, cough- none , dullness-none, rub- none           Chest wall-  Abd-  Br/  Gen/ Rectal- Not done, not indicated Extrem- cyanosis- none, clubbing, none, atrophy- none, strength- nl Neuro- grossly intact to observation

## 2019-08-20 NOTE — Patient Instructions (Signed)
Ok to continue clonazepam, and to increase the dose to 4 tabs (2 mg) some nights if needed  Refill script sent  Ok to add melatonin about an hour before bedtime every night  Please call as needed

## 2019-08-21 DIAGNOSIS — J3089 Other allergic rhinitis: Secondary | ICD-10-CM | POA: Diagnosis not present

## 2019-08-21 DIAGNOSIS — J301 Allergic rhinitis due to pollen: Secondary | ICD-10-CM | POA: Diagnosis not present

## 2019-08-28 DIAGNOSIS — J301 Allergic rhinitis due to pollen: Secondary | ICD-10-CM | POA: Diagnosis not present

## 2019-08-28 DIAGNOSIS — J3089 Other allergic rhinitis: Secondary | ICD-10-CM | POA: Diagnosis not present

## 2019-08-30 DIAGNOSIS — Z1501 Genetic susceptibility to malignant neoplasm of breast: Secondary | ICD-10-CM | POA: Diagnosis not present

## 2019-08-30 DIAGNOSIS — N952 Postmenopausal atrophic vaginitis: Secondary | ICD-10-CM | POA: Diagnosis not present

## 2019-08-30 DIAGNOSIS — Z01419 Encounter for gynecological examination (general) (routine) without abnormal findings: Secondary | ICD-10-CM | POA: Diagnosis not present

## 2019-08-30 DIAGNOSIS — L900 Lichen sclerosus et atrophicus: Secondary | ICD-10-CM | POA: Diagnosis not present

## 2019-09-02 DIAGNOSIS — H1045 Other chronic allergic conjunctivitis: Secondary | ICD-10-CM | POA: Diagnosis not present

## 2019-09-02 DIAGNOSIS — J301 Allergic rhinitis due to pollen: Secondary | ICD-10-CM | POA: Diagnosis not present

## 2019-09-02 DIAGNOSIS — J3089 Other allergic rhinitis: Secondary | ICD-10-CM | POA: Diagnosis not present

## 2019-09-02 DIAGNOSIS — J4520 Mild intermittent asthma, uncomplicated: Secondary | ICD-10-CM | POA: Diagnosis not present

## 2019-09-09 DIAGNOSIS — J3089 Other allergic rhinitis: Secondary | ICD-10-CM | POA: Diagnosis not present

## 2019-09-09 DIAGNOSIS — J301 Allergic rhinitis due to pollen: Secondary | ICD-10-CM | POA: Diagnosis not present

## 2019-09-18 ENCOUNTER — Ambulatory Visit
Admission: RE | Admit: 2019-09-18 | Discharge: 2019-09-18 | Disposition: A | Discharge status: Home / Self Care | Payer: Medicare HMO | Source: Ambulatory Visit | Attending: Obstetrics and Gynecology | Admitting: Obstetrics and Gynecology

## 2019-09-18 ENCOUNTER — Other Ambulatory Visit: Payer: Self-pay

## 2019-09-18 DIAGNOSIS — J3089 Other allergic rhinitis: Secondary | ICD-10-CM | POA: Diagnosis not present

## 2019-09-18 DIAGNOSIS — J301 Allergic rhinitis due to pollen: Secondary | ICD-10-CM | POA: Diagnosis not present

## 2019-09-18 DIAGNOSIS — Z1231 Encounter for screening mammogram for malignant neoplasm of breast: Secondary | ICD-10-CM | POA: Diagnosis not present

## 2019-09-24 DIAGNOSIS — M5134 Other intervertebral disc degeneration, thoracic region: Secondary | ICD-10-CM | POA: Diagnosis not present

## 2019-09-24 DIAGNOSIS — M5136 Other intervertebral disc degeneration, lumbar region: Secondary | ICD-10-CM | POA: Diagnosis not present

## 2019-09-24 DIAGNOSIS — M5031 Other cervical disc degeneration,  high cervical region: Secondary | ICD-10-CM | POA: Diagnosis not present

## 2019-09-24 DIAGNOSIS — M9901 Segmental and somatic dysfunction of cervical region: Secondary | ICD-10-CM | POA: Diagnosis not present

## 2019-09-25 DIAGNOSIS — J301 Allergic rhinitis due to pollen: Secondary | ICD-10-CM | POA: Diagnosis not present

## 2019-09-25 DIAGNOSIS — J3089 Other allergic rhinitis: Secondary | ICD-10-CM | POA: Diagnosis not present

## 2019-09-30 DIAGNOSIS — J3089 Other allergic rhinitis: Secondary | ICD-10-CM | POA: Diagnosis not present

## 2019-09-30 DIAGNOSIS — J301 Allergic rhinitis due to pollen: Secondary | ICD-10-CM | POA: Diagnosis not present

## 2019-10-07 DIAGNOSIS — J301 Allergic rhinitis due to pollen: Secondary | ICD-10-CM | POA: Diagnosis not present

## 2019-10-07 DIAGNOSIS — J3089 Other allergic rhinitis: Secondary | ICD-10-CM | POA: Diagnosis not present

## 2019-10-08 DIAGNOSIS — E559 Vitamin D deficiency, unspecified: Secondary | ICD-10-CM | POA: Diagnosis not present

## 2019-10-08 DIAGNOSIS — J4520 Mild intermittent asthma, uncomplicated: Secondary | ICD-10-CM | POA: Diagnosis not present

## 2019-10-08 DIAGNOSIS — R030 Elevated blood-pressure reading, without diagnosis of hypertension: Secondary | ICD-10-CM | POA: Diagnosis not present

## 2019-10-08 DIAGNOSIS — Z0000 Encounter for general adult medical examination without abnormal findings: Secondary | ICD-10-CM | POA: Diagnosis not present

## 2019-10-08 DIAGNOSIS — Z79899 Other long term (current) drug therapy: Secondary | ICD-10-CM | POA: Diagnosis not present

## 2019-10-08 DIAGNOSIS — J329 Chronic sinusitis, unspecified: Secondary | ICD-10-CM | POA: Diagnosis not present

## 2019-10-08 DIAGNOSIS — J309 Allergic rhinitis, unspecified: Secondary | ICD-10-CM | POA: Diagnosis not present

## 2019-10-08 DIAGNOSIS — E7800 Pure hypercholesterolemia, unspecified: Secondary | ICD-10-CM | POA: Diagnosis not present

## 2019-10-11 DIAGNOSIS — E559 Vitamin D deficiency, unspecified: Secondary | ICD-10-CM | POA: Diagnosis not present

## 2019-10-11 DIAGNOSIS — Z79899 Other long term (current) drug therapy: Secondary | ICD-10-CM | POA: Diagnosis not present

## 2019-10-11 DIAGNOSIS — J4520 Mild intermittent asthma, uncomplicated: Secondary | ICD-10-CM | POA: Diagnosis not present

## 2019-10-11 DIAGNOSIS — Z0000 Encounter for general adult medical examination without abnormal findings: Secondary | ICD-10-CM | POA: Diagnosis not present

## 2019-10-11 DIAGNOSIS — E7800 Pure hypercholesterolemia, unspecified: Secondary | ICD-10-CM | POA: Diagnosis not present

## 2019-10-11 DIAGNOSIS — J309 Allergic rhinitis, unspecified: Secondary | ICD-10-CM | POA: Diagnosis not present

## 2019-10-14 DIAGNOSIS — D1801 Hemangioma of skin and subcutaneous tissue: Secondary | ICD-10-CM | POA: Diagnosis not present

## 2019-10-14 DIAGNOSIS — L738 Other specified follicular disorders: Secondary | ICD-10-CM | POA: Diagnosis not present

## 2019-10-14 DIAGNOSIS — D225 Melanocytic nevi of trunk: Secondary | ICD-10-CM | POA: Diagnosis not present

## 2019-10-14 DIAGNOSIS — Z85820 Personal history of malignant melanoma of skin: Secondary | ICD-10-CM | POA: Diagnosis not present

## 2019-10-14 DIAGNOSIS — L814 Other melanin hyperpigmentation: Secondary | ICD-10-CM | POA: Diagnosis not present

## 2019-10-14 DIAGNOSIS — L821 Other seborrheic keratosis: Secondary | ICD-10-CM | POA: Diagnosis not present

## 2019-10-14 DIAGNOSIS — D2262 Melanocytic nevi of left upper limb, including shoulder: Secondary | ICD-10-CM | POA: Diagnosis not present

## 2019-10-15 DIAGNOSIS — M9901 Segmental and somatic dysfunction of cervical region: Secondary | ICD-10-CM | POA: Diagnosis not present

## 2019-10-15 DIAGNOSIS — M9903 Segmental and somatic dysfunction of lumbar region: Secondary | ICD-10-CM | POA: Diagnosis not present

## 2019-10-15 DIAGNOSIS — M5136 Other intervertebral disc degeneration, lumbar region: Secondary | ICD-10-CM | POA: Diagnosis not present

## 2019-10-15 DIAGNOSIS — M5031 Other cervical disc degeneration,  high cervical region: Secondary | ICD-10-CM | POA: Diagnosis not present

## 2019-10-16 DIAGNOSIS — J301 Allergic rhinitis due to pollen: Secondary | ICD-10-CM | POA: Diagnosis not present

## 2019-10-16 DIAGNOSIS — J3089 Other allergic rhinitis: Secondary | ICD-10-CM | POA: Diagnosis not present

## 2019-10-21 DIAGNOSIS — J301 Allergic rhinitis due to pollen: Secondary | ICD-10-CM | POA: Diagnosis not present

## 2019-10-21 DIAGNOSIS — J3089 Other allergic rhinitis: Secondary | ICD-10-CM | POA: Diagnosis not present

## 2019-10-28 DIAGNOSIS — J3089 Other allergic rhinitis: Secondary | ICD-10-CM | POA: Diagnosis not present

## 2019-10-28 DIAGNOSIS — J301 Allergic rhinitis due to pollen: Secondary | ICD-10-CM | POA: Diagnosis not present

## 2019-11-04 DIAGNOSIS — J301 Allergic rhinitis due to pollen: Secondary | ICD-10-CM | POA: Diagnosis not present

## 2019-11-04 DIAGNOSIS — J3089 Other allergic rhinitis: Secondary | ICD-10-CM | POA: Diagnosis not present

## 2019-11-07 DIAGNOSIS — M5031 Other cervical disc degeneration,  high cervical region: Secondary | ICD-10-CM | POA: Diagnosis not present

## 2019-11-07 DIAGNOSIS — M9901 Segmental and somatic dysfunction of cervical region: Secondary | ICD-10-CM | POA: Diagnosis not present

## 2019-11-07 DIAGNOSIS — M5136 Other intervertebral disc degeneration, lumbar region: Secondary | ICD-10-CM | POA: Diagnosis not present

## 2019-11-07 DIAGNOSIS — M9903 Segmental and somatic dysfunction of lumbar region: Secondary | ICD-10-CM | POA: Diagnosis not present

## 2019-11-13 DIAGNOSIS — J301 Allergic rhinitis due to pollen: Secondary | ICD-10-CM | POA: Diagnosis not present

## 2019-11-13 DIAGNOSIS — J3089 Other allergic rhinitis: Secondary | ICD-10-CM | POA: Diagnosis not present

## 2019-11-18 DIAGNOSIS — J3089 Other allergic rhinitis: Secondary | ICD-10-CM | POA: Diagnosis not present

## 2019-11-18 DIAGNOSIS — J301 Allergic rhinitis due to pollen: Secondary | ICD-10-CM | POA: Diagnosis not present

## 2019-11-25 DIAGNOSIS — J3089 Other allergic rhinitis: Secondary | ICD-10-CM | POA: Diagnosis not present

## 2019-11-25 DIAGNOSIS — J301 Allergic rhinitis due to pollen: Secondary | ICD-10-CM | POA: Diagnosis not present

## 2019-12-02 DIAGNOSIS — J3089 Other allergic rhinitis: Secondary | ICD-10-CM | POA: Diagnosis not present

## 2019-12-02 DIAGNOSIS — J301 Allergic rhinitis due to pollen: Secondary | ICD-10-CM | POA: Diagnosis not present

## 2019-12-06 ENCOUNTER — Ambulatory Visit: Payer: Medicare HMO

## 2019-12-09 DIAGNOSIS — J3089 Other allergic rhinitis: Secondary | ICD-10-CM | POA: Diagnosis not present

## 2019-12-09 DIAGNOSIS — J301 Allergic rhinitis due to pollen: Secondary | ICD-10-CM | POA: Diagnosis not present

## 2019-12-11 DIAGNOSIS — M5136 Other intervertebral disc degeneration, lumbar region: Secondary | ICD-10-CM | POA: Diagnosis not present

## 2019-12-11 DIAGNOSIS — M9903 Segmental and somatic dysfunction of lumbar region: Secondary | ICD-10-CM | POA: Diagnosis not present

## 2019-12-11 DIAGNOSIS — M5031 Other cervical disc degeneration,  high cervical region: Secondary | ICD-10-CM | POA: Diagnosis not present

## 2019-12-11 DIAGNOSIS — M9901 Segmental and somatic dysfunction of cervical region: Secondary | ICD-10-CM | POA: Diagnosis not present

## 2019-12-12 DIAGNOSIS — J3089 Other allergic rhinitis: Secondary | ICD-10-CM | POA: Diagnosis not present

## 2019-12-14 ENCOUNTER — Ambulatory Visit: Payer: Medicare HMO | Attending: Internal Medicine

## 2019-12-18 DIAGNOSIS — J3089 Other allergic rhinitis: Secondary | ICD-10-CM | POA: Diagnosis not present

## 2019-12-18 DIAGNOSIS — J301 Allergic rhinitis due to pollen: Secondary | ICD-10-CM | POA: Diagnosis not present

## 2019-12-24 DIAGNOSIS — M5136 Other intervertebral disc degeneration, lumbar region: Secondary | ICD-10-CM | POA: Diagnosis not present

## 2019-12-24 DIAGNOSIS — M5031 Other cervical disc degeneration,  high cervical region: Secondary | ICD-10-CM | POA: Diagnosis not present

## 2019-12-24 DIAGNOSIS — M9903 Segmental and somatic dysfunction of lumbar region: Secondary | ICD-10-CM | POA: Diagnosis not present

## 2019-12-24 DIAGNOSIS — M9901 Segmental and somatic dysfunction of cervical region: Secondary | ICD-10-CM | POA: Diagnosis not present

## 2019-12-25 DIAGNOSIS — J3089 Other allergic rhinitis: Secondary | ICD-10-CM | POA: Diagnosis not present

## 2019-12-25 DIAGNOSIS — J301 Allergic rhinitis due to pollen: Secondary | ICD-10-CM | POA: Diagnosis not present

## 2020-01-01 DIAGNOSIS — J301 Allergic rhinitis due to pollen: Secondary | ICD-10-CM | POA: Diagnosis not present

## 2020-01-01 DIAGNOSIS — J3089 Other allergic rhinitis: Secondary | ICD-10-CM | POA: Diagnosis not present

## 2020-01-08 DIAGNOSIS — M9903 Segmental and somatic dysfunction of lumbar region: Secondary | ICD-10-CM | POA: Diagnosis not present

## 2020-01-08 DIAGNOSIS — M5136 Other intervertebral disc degeneration, lumbar region: Secondary | ICD-10-CM | POA: Diagnosis not present

## 2020-01-08 DIAGNOSIS — M9901 Segmental and somatic dysfunction of cervical region: Secondary | ICD-10-CM | POA: Diagnosis not present

## 2020-01-08 DIAGNOSIS — M5031 Other cervical disc degeneration,  high cervical region: Secondary | ICD-10-CM | POA: Diagnosis not present

## 2020-01-13 DIAGNOSIS — J3089 Other allergic rhinitis: Secondary | ICD-10-CM | POA: Diagnosis not present

## 2020-01-13 DIAGNOSIS — J301 Allergic rhinitis due to pollen: Secondary | ICD-10-CM | POA: Diagnosis not present

## 2020-01-20 DIAGNOSIS — M9901 Segmental and somatic dysfunction of cervical region: Secondary | ICD-10-CM | POA: Diagnosis not present

## 2020-01-20 DIAGNOSIS — M5136 Other intervertebral disc degeneration, lumbar region: Secondary | ICD-10-CM | POA: Diagnosis not present

## 2020-01-20 DIAGNOSIS — M9903 Segmental and somatic dysfunction of lumbar region: Secondary | ICD-10-CM | POA: Diagnosis not present

## 2020-01-20 DIAGNOSIS — M5031 Other cervical disc degeneration,  high cervical region: Secondary | ICD-10-CM | POA: Diagnosis not present

## 2020-01-20 DIAGNOSIS — J301 Allergic rhinitis due to pollen: Secondary | ICD-10-CM | POA: Diagnosis not present

## 2020-01-20 DIAGNOSIS — J3089 Other allergic rhinitis: Secondary | ICD-10-CM | POA: Diagnosis not present

## 2020-01-27 DIAGNOSIS — J3089 Other allergic rhinitis: Secondary | ICD-10-CM | POA: Diagnosis not present

## 2020-01-27 DIAGNOSIS — J301 Allergic rhinitis due to pollen: Secondary | ICD-10-CM | POA: Diagnosis not present

## 2020-01-30 DIAGNOSIS — M9901 Segmental and somatic dysfunction of cervical region: Secondary | ICD-10-CM | POA: Diagnosis not present

## 2020-01-30 DIAGNOSIS — M5031 Other cervical disc degeneration,  high cervical region: Secondary | ICD-10-CM | POA: Diagnosis not present

## 2020-01-30 DIAGNOSIS — M9903 Segmental and somatic dysfunction of lumbar region: Secondary | ICD-10-CM | POA: Diagnosis not present

## 2020-01-30 DIAGNOSIS — M5136 Other intervertebral disc degeneration, lumbar region: Secondary | ICD-10-CM | POA: Diagnosis not present

## 2020-02-01 DIAGNOSIS — M25572 Pain in left ankle and joints of left foot: Secondary | ICD-10-CM | POA: Diagnosis not present

## 2020-02-01 DIAGNOSIS — S20212A Contusion of left front wall of thorax, initial encounter: Secondary | ICD-10-CM | POA: Diagnosis not present

## 2020-02-03 DIAGNOSIS — J3089 Other allergic rhinitis: Secondary | ICD-10-CM | POA: Diagnosis not present

## 2020-02-03 DIAGNOSIS — J301 Allergic rhinitis due to pollen: Secondary | ICD-10-CM | POA: Diagnosis not present

## 2020-02-10 DIAGNOSIS — R0781 Pleurodynia: Secondary | ICD-10-CM | POA: Diagnosis not present

## 2020-02-10 DIAGNOSIS — J301 Allergic rhinitis due to pollen: Secondary | ICD-10-CM | POA: Diagnosis not present

## 2020-02-10 DIAGNOSIS — J3089 Other allergic rhinitis: Secondary | ICD-10-CM | POA: Diagnosis not present

## 2020-02-17 DIAGNOSIS — J3089 Other allergic rhinitis: Secondary | ICD-10-CM | POA: Diagnosis not present

## 2020-02-24 DIAGNOSIS — J301 Allergic rhinitis due to pollen: Secondary | ICD-10-CM | POA: Diagnosis not present

## 2020-02-24 DIAGNOSIS — J3089 Other allergic rhinitis: Secondary | ICD-10-CM | POA: Diagnosis not present

## 2020-03-02 DIAGNOSIS — J301 Allergic rhinitis due to pollen: Secondary | ICD-10-CM | POA: Diagnosis not present

## 2020-03-02 DIAGNOSIS — J3089 Other allergic rhinitis: Secondary | ICD-10-CM | POA: Diagnosis not present

## 2020-03-05 DIAGNOSIS — M9901 Segmental and somatic dysfunction of cervical region: Secondary | ICD-10-CM | POA: Diagnosis not present

## 2020-03-05 DIAGNOSIS — M5134 Other intervertebral disc degeneration, thoracic region: Secondary | ICD-10-CM | POA: Diagnosis not present

## 2020-03-05 DIAGNOSIS — M5031 Other cervical disc degeneration,  high cervical region: Secondary | ICD-10-CM | POA: Diagnosis not present

## 2020-03-05 DIAGNOSIS — M9902 Segmental and somatic dysfunction of thoracic region: Secondary | ICD-10-CM | POA: Diagnosis not present

## 2020-03-09 DIAGNOSIS — M5134 Other intervertebral disc degeneration, thoracic region: Secondary | ICD-10-CM | POA: Diagnosis not present

## 2020-03-09 DIAGNOSIS — M9901 Segmental and somatic dysfunction of cervical region: Secondary | ICD-10-CM | POA: Diagnosis not present

## 2020-03-09 DIAGNOSIS — M9902 Segmental and somatic dysfunction of thoracic region: Secondary | ICD-10-CM | POA: Diagnosis not present

## 2020-03-09 DIAGNOSIS — M5031 Other cervical disc degeneration,  high cervical region: Secondary | ICD-10-CM | POA: Diagnosis not present

## 2020-03-11 DIAGNOSIS — J3089 Other allergic rhinitis: Secondary | ICD-10-CM | POA: Diagnosis not present

## 2020-03-11 DIAGNOSIS — J301 Allergic rhinitis due to pollen: Secondary | ICD-10-CM | POA: Diagnosis not present

## 2020-03-12 DIAGNOSIS — M5031 Other cervical disc degeneration,  high cervical region: Secondary | ICD-10-CM | POA: Diagnosis not present

## 2020-03-12 DIAGNOSIS — M9901 Segmental and somatic dysfunction of cervical region: Secondary | ICD-10-CM | POA: Diagnosis not present

## 2020-03-12 DIAGNOSIS — M9902 Segmental and somatic dysfunction of thoracic region: Secondary | ICD-10-CM | POA: Diagnosis not present

## 2020-03-12 DIAGNOSIS — M5134 Other intervertebral disc degeneration, thoracic region: Secondary | ICD-10-CM | POA: Diagnosis not present

## 2020-03-16 DIAGNOSIS — M5134 Other intervertebral disc degeneration, thoracic region: Secondary | ICD-10-CM | POA: Diagnosis not present

## 2020-03-16 DIAGNOSIS — M5031 Other cervical disc degeneration,  high cervical region: Secondary | ICD-10-CM | POA: Diagnosis not present

## 2020-03-16 DIAGNOSIS — M9901 Segmental and somatic dysfunction of cervical region: Secondary | ICD-10-CM | POA: Diagnosis not present

## 2020-03-16 DIAGNOSIS — J301 Allergic rhinitis due to pollen: Secondary | ICD-10-CM | POA: Diagnosis not present

## 2020-03-16 DIAGNOSIS — M9902 Segmental and somatic dysfunction of thoracic region: Secondary | ICD-10-CM | POA: Diagnosis not present

## 2020-03-17 DIAGNOSIS — H2513 Age-related nuclear cataract, bilateral: Secondary | ICD-10-CM | POA: Diagnosis not present

## 2020-03-17 DIAGNOSIS — H10413 Chronic giant papillary conjunctivitis, bilateral: Secondary | ICD-10-CM | POA: Diagnosis not present

## 2020-03-17 DIAGNOSIS — H52203 Unspecified astigmatism, bilateral: Secondary | ICD-10-CM | POA: Diagnosis not present

## 2020-03-17 DIAGNOSIS — H524 Presbyopia: Secondary | ICD-10-CM | POA: Diagnosis not present

## 2020-03-18 DIAGNOSIS — J301 Allergic rhinitis due to pollen: Secondary | ICD-10-CM | POA: Diagnosis not present

## 2020-03-18 DIAGNOSIS — J3089 Other allergic rhinitis: Secondary | ICD-10-CM | POA: Diagnosis not present

## 2020-03-19 DIAGNOSIS — M5031 Other cervical disc degeneration,  high cervical region: Secondary | ICD-10-CM | POA: Diagnosis not present

## 2020-03-19 DIAGNOSIS — M5134 Other intervertebral disc degeneration, thoracic region: Secondary | ICD-10-CM | POA: Diagnosis not present

## 2020-03-19 DIAGNOSIS — M9902 Segmental and somatic dysfunction of thoracic region: Secondary | ICD-10-CM | POA: Diagnosis not present

## 2020-03-19 DIAGNOSIS — M9901 Segmental and somatic dysfunction of cervical region: Secondary | ICD-10-CM | POA: Diagnosis not present

## 2020-03-23 DIAGNOSIS — J301 Allergic rhinitis due to pollen: Secondary | ICD-10-CM | POA: Diagnosis not present

## 2020-03-23 DIAGNOSIS — J3089 Other allergic rhinitis: Secondary | ICD-10-CM | POA: Diagnosis not present

## 2020-03-30 DIAGNOSIS — J301 Allergic rhinitis due to pollen: Secondary | ICD-10-CM | POA: Diagnosis not present

## 2020-03-30 DIAGNOSIS — J3089 Other allergic rhinitis: Secondary | ICD-10-CM | POA: Diagnosis not present

## 2020-04-07 DIAGNOSIS — M9901 Segmental and somatic dysfunction of cervical region: Secondary | ICD-10-CM | POA: Diagnosis not present

## 2020-04-07 DIAGNOSIS — M5031 Other cervical disc degeneration,  high cervical region: Secondary | ICD-10-CM | POA: Diagnosis not present

## 2020-04-07 DIAGNOSIS — M9902 Segmental and somatic dysfunction of thoracic region: Secondary | ICD-10-CM | POA: Diagnosis not present

## 2020-04-07 DIAGNOSIS — M5134 Other intervertebral disc degeneration, thoracic region: Secondary | ICD-10-CM | POA: Diagnosis not present

## 2020-04-08 DIAGNOSIS — J3089 Other allergic rhinitis: Secondary | ICD-10-CM | POA: Diagnosis not present

## 2020-04-08 DIAGNOSIS — J301 Allergic rhinitis due to pollen: Secondary | ICD-10-CM | POA: Diagnosis not present

## 2020-04-13 DIAGNOSIS — J301 Allergic rhinitis due to pollen: Secondary | ICD-10-CM | POA: Diagnosis not present

## 2020-04-13 DIAGNOSIS — J3089 Other allergic rhinitis: Secondary | ICD-10-CM | POA: Diagnosis not present

## 2020-04-14 ENCOUNTER — Other Ambulatory Visit: Payer: Self-pay | Admitting: Internal Medicine

## 2020-04-14 NOTE — Telephone Encounter (Signed)
Dr. Annamaria Boots, please advise if you are okay refilling med for pt.  Allergies  Allergen Reactions  . Cefzil [Cefprozil]   . Erythromycin   . Sulfa Antibiotics      Current Outpatient Medications:  .  albuterol (PROVENTIL HFA;VENTOLIN HFA) 108 (90 Base) MCG/ACT inhaler, Inhale 1-2 puffs into the lungs every 6 (six) hours as needed for wheezing or shortness of breath., Disp: , Rfl:  .  azelastine (OPTIVAR) 0.05 % ophthalmic solution, INSTILL 1 DROP TO AFFECTED EYE(S) TWICE A DAY, Disp: , Rfl: 2 .  clonazePAM (KLONOPIN) 0.5 MG tablet, 1-3 tabs for sleep if needed, Disp: 90 tablet, Rfl: 5 .  Coenzyme Q10 (COQ10) 200 MG CAPS, Take 1 capsule by mouth daily., Disp: , Rfl:  .  Cyanocobalamin (VITAMIN B-12) 500 MCG SUBL, Place 1 tablet under the tongue daily after lunch., Disp: , Rfl:  .  EPIPEN 2-PAK 0.3 MG/0.3ML DEVI, , Disp: , Rfl:  .  ergocalciferol (DRISDOL) 8000 UNIT/ML drops, 4 drops qd sublingual, Disp: , Rfl:  .  milk thistle 175 MG tablet, Take 175 mg by mouth daily., Disp: , Rfl:  .  Multiple Vitamins-Minerals (ANTIOXIDANT PO), Take 2 capsules by mouth daily., Disp: , Rfl:  .  OMEGA 3 1000 MG CAPS, Take 1 capsule by mouth daily., Disp: , Rfl:  .  TURMERIC PO, Take by mouth., Disp: , Rfl:

## 2020-04-14 NOTE — Telephone Encounter (Signed)
Clonazepam refill e-sent 

## 2020-04-20 DIAGNOSIS — J301 Allergic rhinitis due to pollen: Secondary | ICD-10-CM | POA: Diagnosis not present

## 2020-04-20 DIAGNOSIS — J3089 Other allergic rhinitis: Secondary | ICD-10-CM | POA: Diagnosis not present

## 2020-04-27 DIAGNOSIS — J301 Allergic rhinitis due to pollen: Secondary | ICD-10-CM | POA: Diagnosis not present

## 2020-04-27 DIAGNOSIS — J3089 Other allergic rhinitis: Secondary | ICD-10-CM | POA: Diagnosis not present

## 2020-04-28 DIAGNOSIS — M5134 Other intervertebral disc degeneration, thoracic region: Secondary | ICD-10-CM | POA: Diagnosis not present

## 2020-04-28 DIAGNOSIS — M5031 Other cervical disc degeneration,  high cervical region: Secondary | ICD-10-CM | POA: Diagnosis not present

## 2020-04-28 DIAGNOSIS — M9902 Segmental and somatic dysfunction of thoracic region: Secondary | ICD-10-CM | POA: Diagnosis not present

## 2020-04-28 DIAGNOSIS — M9901 Segmental and somatic dysfunction of cervical region: Secondary | ICD-10-CM | POA: Diagnosis not present

## 2020-05-04 DIAGNOSIS — Z881 Allergy status to other antibiotic agents status: Secondary | ICD-10-CM | POA: Diagnosis not present

## 2020-05-04 DIAGNOSIS — R030 Elevated blood-pressure reading, without diagnosis of hypertension: Secondary | ICD-10-CM | POA: Diagnosis not present

## 2020-05-04 DIAGNOSIS — Z882 Allergy status to sulfonamides status: Secondary | ICD-10-CM | POA: Diagnosis not present

## 2020-05-04 DIAGNOSIS — Z85828 Personal history of other malignant neoplasm of skin: Secondary | ICD-10-CM | POA: Diagnosis not present

## 2020-05-04 DIAGNOSIS — J45909 Unspecified asthma, uncomplicated: Secondary | ICD-10-CM | POA: Diagnosis not present

## 2020-05-04 DIAGNOSIS — J301 Allergic rhinitis due to pollen: Secondary | ICD-10-CM | POA: Diagnosis not present

## 2020-05-04 DIAGNOSIS — Z79899 Other long term (current) drug therapy: Secondary | ICD-10-CM | POA: Diagnosis not present

## 2020-05-04 DIAGNOSIS — G4700 Insomnia, unspecified: Secondary | ICD-10-CM | POA: Diagnosis not present

## 2020-05-04 DIAGNOSIS — Z809 Family history of malignant neoplasm, unspecified: Secondary | ICD-10-CM | POA: Diagnosis not present

## 2020-05-04 DIAGNOSIS — M1990 Unspecified osteoarthritis, unspecified site: Secondary | ICD-10-CM | POA: Diagnosis not present

## 2020-05-04 DIAGNOSIS — K5900 Constipation, unspecified: Secondary | ICD-10-CM | POA: Diagnosis not present

## 2020-05-04 DIAGNOSIS — J3089 Other allergic rhinitis: Secondary | ICD-10-CM | POA: Diagnosis not present

## 2020-05-13 DIAGNOSIS — J3089 Other allergic rhinitis: Secondary | ICD-10-CM | POA: Diagnosis not present

## 2020-05-13 DIAGNOSIS — J301 Allergic rhinitis due to pollen: Secondary | ICD-10-CM | POA: Diagnosis not present

## 2020-05-13 DIAGNOSIS — J3081 Allergic rhinitis due to animal (cat) (dog) hair and dander: Secondary | ICD-10-CM | POA: Diagnosis not present

## 2020-05-18 DIAGNOSIS — J3089 Other allergic rhinitis: Secondary | ICD-10-CM | POA: Diagnosis not present

## 2020-05-18 DIAGNOSIS — J301 Allergic rhinitis due to pollen: Secondary | ICD-10-CM | POA: Diagnosis not present

## 2020-05-20 DIAGNOSIS — J3089 Other allergic rhinitis: Secondary | ICD-10-CM | POA: Diagnosis not present

## 2020-05-25 DIAGNOSIS — J3089 Other allergic rhinitis: Secondary | ICD-10-CM | POA: Diagnosis not present

## 2020-05-25 DIAGNOSIS — J301 Allergic rhinitis due to pollen: Secondary | ICD-10-CM | POA: Diagnosis not present

## 2020-05-27 DIAGNOSIS — M9903 Segmental and somatic dysfunction of lumbar region: Secondary | ICD-10-CM | POA: Diagnosis not present

## 2020-05-27 DIAGNOSIS — M5136 Other intervertebral disc degeneration, lumbar region: Secondary | ICD-10-CM | POA: Diagnosis not present

## 2020-05-27 DIAGNOSIS — M5031 Other cervical disc degeneration,  high cervical region: Secondary | ICD-10-CM | POA: Diagnosis not present

## 2020-05-27 DIAGNOSIS — M9901 Segmental and somatic dysfunction of cervical region: Secondary | ICD-10-CM | POA: Diagnosis not present

## 2020-06-01 DIAGNOSIS — J301 Allergic rhinitis due to pollen: Secondary | ICD-10-CM | POA: Diagnosis not present

## 2020-06-01 DIAGNOSIS — J3089 Other allergic rhinitis: Secondary | ICD-10-CM | POA: Diagnosis not present

## 2020-06-08 DIAGNOSIS — J301 Allergic rhinitis due to pollen: Secondary | ICD-10-CM | POA: Diagnosis not present

## 2020-06-08 DIAGNOSIS — J3089 Other allergic rhinitis: Secondary | ICD-10-CM | POA: Diagnosis not present

## 2020-06-11 DIAGNOSIS — M9901 Segmental and somatic dysfunction of cervical region: Secondary | ICD-10-CM | POA: Diagnosis not present

## 2020-06-11 DIAGNOSIS — M9903 Segmental and somatic dysfunction of lumbar region: Secondary | ICD-10-CM | POA: Diagnosis not present

## 2020-06-11 DIAGNOSIS — M5136 Other intervertebral disc degeneration, lumbar region: Secondary | ICD-10-CM | POA: Diagnosis not present

## 2020-06-11 DIAGNOSIS — M5031 Other cervical disc degeneration,  high cervical region: Secondary | ICD-10-CM | POA: Diagnosis not present

## 2020-06-22 DIAGNOSIS — J301 Allergic rhinitis due to pollen: Secondary | ICD-10-CM | POA: Diagnosis not present

## 2020-06-22 DIAGNOSIS — J3089 Other allergic rhinitis: Secondary | ICD-10-CM | POA: Diagnosis not present

## 2020-06-23 DIAGNOSIS — R69 Illness, unspecified: Secondary | ICD-10-CM | POA: Diagnosis not present

## 2020-06-24 DIAGNOSIS — M5136 Other intervertebral disc degeneration, lumbar region: Secondary | ICD-10-CM | POA: Diagnosis not present

## 2020-06-24 DIAGNOSIS — M5031 Other cervical disc degeneration,  high cervical region: Secondary | ICD-10-CM | POA: Diagnosis not present

## 2020-06-24 DIAGNOSIS — M9903 Segmental and somatic dysfunction of lumbar region: Secondary | ICD-10-CM | POA: Diagnosis not present

## 2020-06-24 DIAGNOSIS — M9901 Segmental and somatic dysfunction of cervical region: Secondary | ICD-10-CM | POA: Diagnosis not present

## 2020-06-29 DIAGNOSIS — J301 Allergic rhinitis due to pollen: Secondary | ICD-10-CM | POA: Diagnosis not present

## 2020-06-29 DIAGNOSIS — J3089 Other allergic rhinitis: Secondary | ICD-10-CM | POA: Diagnosis not present

## 2020-07-06 DIAGNOSIS — J301 Allergic rhinitis due to pollen: Secondary | ICD-10-CM | POA: Diagnosis not present

## 2020-07-06 DIAGNOSIS — J3089 Other allergic rhinitis: Secondary | ICD-10-CM | POA: Diagnosis not present

## 2020-07-15 DIAGNOSIS — J3089 Other allergic rhinitis: Secondary | ICD-10-CM | POA: Diagnosis not present

## 2020-07-15 DIAGNOSIS — J301 Allergic rhinitis due to pollen: Secondary | ICD-10-CM | POA: Diagnosis not present

## 2020-07-20 DIAGNOSIS — J301 Allergic rhinitis due to pollen: Secondary | ICD-10-CM | POA: Diagnosis not present

## 2020-07-20 DIAGNOSIS — J3089 Other allergic rhinitis: Secondary | ICD-10-CM | POA: Diagnosis not present

## 2020-07-23 ENCOUNTER — Telehealth: Payer: Self-pay | Admitting: Internal Medicine

## 2020-07-23 NOTE — Telephone Encounter (Signed)
Rx for clonazepam had been sent to pharmacy for pt 04/14/20 with 5RF listed. Called and spoke with pt about this.  Pt stated she had just spoken with pharmacy and they were able to find Rx and are getting it ready for her. Nothing further needed.

## 2020-07-27 DIAGNOSIS — J301 Allergic rhinitis due to pollen: Secondary | ICD-10-CM | POA: Diagnosis not present

## 2020-07-27 DIAGNOSIS — J3089 Other allergic rhinitis: Secondary | ICD-10-CM | POA: Diagnosis not present

## 2020-07-31 ENCOUNTER — Other Ambulatory Visit: Payer: Self-pay | Admitting: Obstetrics and Gynecology

## 2020-07-31 DIAGNOSIS — Z1231 Encounter for screening mammogram for malignant neoplasm of breast: Secondary | ICD-10-CM

## 2020-08-01 DIAGNOSIS — Z23 Encounter for immunization: Secondary | ICD-10-CM | POA: Diagnosis not present

## 2020-08-03 DIAGNOSIS — N6310 Unspecified lump in the right breast, unspecified quadrant: Secondary | ICD-10-CM | POA: Diagnosis not present

## 2020-08-03 DIAGNOSIS — Z1501 Genetic susceptibility to malignant neoplasm of breast: Secondary | ICD-10-CM | POA: Diagnosis not present

## 2020-08-04 DIAGNOSIS — M9901 Segmental and somatic dysfunction of cervical region: Secondary | ICD-10-CM | POA: Diagnosis not present

## 2020-08-04 DIAGNOSIS — M5136 Other intervertebral disc degeneration, lumbar region: Secondary | ICD-10-CM | POA: Diagnosis not present

## 2020-08-04 DIAGNOSIS — M5031 Other cervical disc degeneration,  high cervical region: Secondary | ICD-10-CM | POA: Diagnosis not present

## 2020-08-04 DIAGNOSIS — M9903 Segmental and somatic dysfunction of lumbar region: Secondary | ICD-10-CM | POA: Diagnosis not present

## 2020-08-05 ENCOUNTER — Other Ambulatory Visit: Payer: Self-pay | Admitting: Obstetrics and Gynecology

## 2020-08-05 DIAGNOSIS — J3089 Other allergic rhinitis: Secondary | ICD-10-CM | POA: Diagnosis not present

## 2020-08-05 DIAGNOSIS — J301 Allergic rhinitis due to pollen: Secondary | ICD-10-CM | POA: Diagnosis not present

## 2020-08-05 DIAGNOSIS — N6315 Unspecified lump in the right breast, overlapping quadrants: Secondary | ICD-10-CM

## 2020-08-18 ENCOUNTER — Other Ambulatory Visit: Payer: Medicare HMO

## 2020-08-18 DIAGNOSIS — M9901 Segmental and somatic dysfunction of cervical region: Secondary | ICD-10-CM | POA: Diagnosis not present

## 2020-08-18 DIAGNOSIS — M5136 Other intervertebral disc degeneration, lumbar region: Secondary | ICD-10-CM | POA: Diagnosis not present

## 2020-08-18 DIAGNOSIS — M9903 Segmental and somatic dysfunction of lumbar region: Secondary | ICD-10-CM | POA: Diagnosis not present

## 2020-08-18 DIAGNOSIS — M5031 Other cervical disc degeneration,  high cervical region: Secondary | ICD-10-CM | POA: Diagnosis not present

## 2020-08-19 ENCOUNTER — Encounter: Payer: Self-pay | Admitting: Internal Medicine

## 2020-08-19 ENCOUNTER — Other Ambulatory Visit: Payer: Self-pay

## 2020-08-19 ENCOUNTER — Ambulatory Visit: Payer: Medicare HMO | Admitting: Internal Medicine

## 2020-08-19 DIAGNOSIS — J302 Other seasonal allergic rhinitis: Secondary | ICD-10-CM | POA: Diagnosis not present

## 2020-08-19 DIAGNOSIS — M9901 Segmental and somatic dysfunction of cervical region: Secondary | ICD-10-CM | POA: Diagnosis not present

## 2020-08-19 DIAGNOSIS — F5104 Psychophysiologic insomnia: Secondary | ICD-10-CM | POA: Diagnosis not present

## 2020-08-19 DIAGNOSIS — J3089 Other allergic rhinitis: Secondary | ICD-10-CM

## 2020-08-19 DIAGNOSIS — J301 Allergic rhinitis due to pollen: Secondary | ICD-10-CM | POA: Diagnosis not present

## 2020-08-19 DIAGNOSIS — R69 Illness, unspecified: Secondary | ICD-10-CM | POA: Diagnosis not present

## 2020-08-19 DIAGNOSIS — M5136 Other intervertebral disc degeneration, lumbar region: Secondary | ICD-10-CM | POA: Diagnosis not present

## 2020-08-19 DIAGNOSIS — M5031 Other cervical disc degeneration,  high cervical region: Secondary | ICD-10-CM | POA: Diagnosis not present

## 2020-08-19 DIAGNOSIS — M9903 Segmental and somatic dysfunction of lumbar region: Secondary | ICD-10-CM | POA: Diagnosis not present

## 2020-08-19 NOTE — Progress Notes (Signed)
HPI female former smoker followed for chronic insomnia since fall with hand injury/reflex sympathetic dystrophy., Complicated by allergic rhinitis (Dr. Donneta Romberg), GERD Says she failed trazodone and Lunesta. She is afraid of what she has heard about Ambien side effects/sleepwalking her zaleplon "relaxes her" so she can go to sleep but often lets her wake during the night.   ----------------------------------------------------------------------------------.   08/20/2019-  74 year old female former smoker followed for chronic insomnia since fall with hand injury/reflex sympathetic dystrophy., Complicated by allergic rhinitis (Dr. Donneta Romberg), GERD Med Hx: Failed trazodone, Doxepin, Benadryl, temazepam, Lunesta, zaleplon,  Uncomfortable with reported Ambien side effects/sleepwalking.  Zaleplon did not last long enough -----followed for chronic insomnia; pt reports trouble sleeping d/t life events; taking clonazepam nightly  Followed elsewhere for cervical spine level dysfunction. Clonazepam 0.5 mg 1-3 for sleep ------Discusses stressors in life- downsized to apartment and feels confined when husband is home, with covid precautions. Very light sensitive at night. Has been taking 0.5 mg clonazepam most nights; may occasionally need more.  This is still the best med for sleep she has used. Discussed ability to add melatonin. Using chlortrimeton for allergic postnasal drip and can discuss more with her allergist.  08/19/20- 08/20/2019-  74 year old female former smoker followed for chronic insomnia since fall with hand injury/reflex sympathetic dystrophy., Complicated by allergic rhinitis (Dr. Donneta Romberg), GERD Med Hx: Failed trazodone, Doxepin, Benadryl, temazepam, Lunesta, zaleplon,  Uncomfortable with reported Ambien side effects/sleepwalking.  Zaleplon did not last long enough Clonazepam 0.5 1-3 at hs Covid vax- 3 Phizer Flu- had Usually 1 tab clonazepam sufficient for sleep.  Husband at home more with Covid  tele-teaching, and in apartment she still feels need to get away. Suggested walking, cards with select friends at their homes, etc.  Continues allergy shots from Dr Donneta Romberg Discussed adding melatonin.  ROS-see HPI + = positive Constitutional:   No-   weight loss, night sweats, fevers, chills, fatigue, lassitude. HEENT:   + headaches, no-difficulty swallowing, tooth/dental problems, sore throat,       No-  sneezing, itching, ear ache, nasal congestion, post nasal drip,  CV:  No-   chest pain, orthopnea, PND, +swelling in lower extremities, anasarca,  dizziness, palpitations Resp: No-   shortness of breath with exertion or at rest.              No-   productive cough,  No non-productive cough,  No- coughing up of blood.              No-   change in color of mucus.  No- wheezing.   Skin: No-   rash or lesions. GI:  No-   heartburn, indigestion, abdominal pain, nausea, vomiting,  GU:  MS:  No-   joint pain or swelling.   Neuro-     nothing unusual Psych:  No- change in mood or affect. No depression or+ anxiety.  No memory loss.  OBJ- Physical Exam General- Alert, Oriented, Affect-appropriate, Distress- none acute. Appears well.  Skin- rash-none, lesions- none, excoriation- none Lymphadenopathy- none Head- atraumatic            Eyes- Gross vision intact, PERRLA, conjunctivae and secretions clear            Ears- Hearing, canals-normal            Nose- Clear, no-Septal dev, mucus, polyps, erosion, perforation             Throat- Mallampati II , mucosa clear , drainage- none, tonsils- atrophic Neck- flexible , trachea midline,  no stridor , thyroid nl, carotid no bruit Chest - symmetrical excursion , unlabored           Heart/CV- RRR , no murmur , no gallop  , no rub, nl s1 s2                           - JVD- none , edema- none, stasis changes- none, varices- none           Lung- clear to P&A, wheeze- none, cough- none , dullness-none, rub- none           Chest wall-  Abd-  Br/ Gen/ Rectal-  Not done, not indicated Extrem- cyanosis- none, clubbing, none, atrophy- none, strength- nl Neuro- grossly intact to observation

## 2020-08-19 NOTE — Patient Instructions (Signed)
Ok to continue clonazepam   Consider adding melatonin (about 5 mg)  And you can take both this and the clonazepam about 30 minutes before bed tme  Try to find some friends to do things with, in their homes. A trusted circle of friends who are Covid careful can help with the trapped feeling.  It can also help to get out and go walking to burn off nervous energy.  You can do this by yourself or with a friend, without a mask.

## 2020-08-25 NOTE — Assessment & Plan Note (Signed)
She implies she is sleeping better, but still emphasizes anxiety impacting sleep. Especially need for personal space outside home vs anxiety about potential Covid exposures. Plan- add melatonin 5 mg

## 2020-08-25 NOTE — Assessment & Plan Note (Signed)
Managed ok, working with her allergist. Doesn't seem to be impacting sleep now.

## 2020-08-26 DIAGNOSIS — M5136 Other intervertebral disc degeneration, lumbar region: Secondary | ICD-10-CM | POA: Diagnosis not present

## 2020-08-26 DIAGNOSIS — M5031 Other cervical disc degeneration,  high cervical region: Secondary | ICD-10-CM | POA: Diagnosis not present

## 2020-08-26 DIAGNOSIS — J301 Allergic rhinitis due to pollen: Secondary | ICD-10-CM | POA: Diagnosis not present

## 2020-08-26 DIAGNOSIS — M9903 Segmental and somatic dysfunction of lumbar region: Secondary | ICD-10-CM | POA: Diagnosis not present

## 2020-08-26 DIAGNOSIS — J3089 Other allergic rhinitis: Secondary | ICD-10-CM | POA: Diagnosis not present

## 2020-08-26 DIAGNOSIS — M9901 Segmental and somatic dysfunction of cervical region: Secondary | ICD-10-CM | POA: Diagnosis not present

## 2020-09-01 DIAGNOSIS — J4520 Mild intermittent asthma, uncomplicated: Secondary | ICD-10-CM | POA: Diagnosis not present

## 2020-09-01 DIAGNOSIS — J301 Allergic rhinitis due to pollen: Secondary | ICD-10-CM | POA: Diagnosis not present

## 2020-09-01 DIAGNOSIS — H1045 Other chronic allergic conjunctivitis: Secondary | ICD-10-CM | POA: Diagnosis not present

## 2020-09-01 DIAGNOSIS — J3089 Other allergic rhinitis: Secondary | ICD-10-CM | POA: Diagnosis not present

## 2020-09-02 DIAGNOSIS — J301 Allergic rhinitis due to pollen: Secondary | ICD-10-CM | POA: Diagnosis not present

## 2020-09-02 DIAGNOSIS — J3089 Other allergic rhinitis: Secondary | ICD-10-CM | POA: Diagnosis not present

## 2020-09-03 DIAGNOSIS — J301 Allergic rhinitis due to pollen: Secondary | ICD-10-CM | POA: Diagnosis not present

## 2020-09-07 DIAGNOSIS — J301 Allergic rhinitis due to pollen: Secondary | ICD-10-CM | POA: Diagnosis not present

## 2020-09-07 DIAGNOSIS — J3089 Other allergic rhinitis: Secondary | ICD-10-CM | POA: Diagnosis not present

## 2020-09-14 DIAGNOSIS — J3089 Other allergic rhinitis: Secondary | ICD-10-CM | POA: Diagnosis not present

## 2020-09-14 DIAGNOSIS — J301 Allergic rhinitis due to pollen: Secondary | ICD-10-CM | POA: Diagnosis not present

## 2020-09-21 DIAGNOSIS — J301 Allergic rhinitis due to pollen: Secondary | ICD-10-CM | POA: Diagnosis not present

## 2020-09-21 DIAGNOSIS — J3089 Other allergic rhinitis: Secondary | ICD-10-CM | POA: Diagnosis not present

## 2020-09-23 DIAGNOSIS — H90A21 Sensorineural hearing loss, unilateral, right ear, with restricted hearing on the contralateral side: Secondary | ICD-10-CM | POA: Diagnosis not present

## 2020-09-23 DIAGNOSIS — H9311 Tinnitus, right ear: Secondary | ICD-10-CM | POA: Diagnosis not present

## 2020-09-23 DIAGNOSIS — H9191 Unspecified hearing loss, right ear: Secondary | ICD-10-CM | POA: Diagnosis not present

## 2020-09-28 ENCOUNTER — Other Ambulatory Visit: Payer: Medicare HMO

## 2020-10-05 DIAGNOSIS — Z124 Encounter for screening for malignant neoplasm of cervix: Secondary | ICD-10-CM | POA: Diagnosis not present

## 2020-10-05 DIAGNOSIS — Z6826 Body mass index (BMI) 26.0-26.9, adult: Secondary | ICD-10-CM | POA: Diagnosis not present

## 2020-10-05 DIAGNOSIS — Z1272 Encounter for screening for malignant neoplasm of vagina: Secondary | ICD-10-CM | POA: Diagnosis not present

## 2020-10-05 DIAGNOSIS — L900 Lichen sclerosus et atrophicus: Secondary | ICD-10-CM | POA: Diagnosis not present

## 2020-10-05 DIAGNOSIS — Z1501 Genetic susceptibility to malignant neoplasm of breast: Secondary | ICD-10-CM | POA: Diagnosis not present

## 2020-10-06 DIAGNOSIS — E7800 Pure hypercholesterolemia, unspecified: Secondary | ICD-10-CM | POA: Diagnosis not present

## 2020-10-06 DIAGNOSIS — K5900 Constipation, unspecified: Secondary | ICD-10-CM | POA: Diagnosis not present

## 2020-10-06 DIAGNOSIS — E559 Vitamin D deficiency, unspecified: Secondary | ICD-10-CM | POA: Diagnosis not present

## 2020-10-07 DIAGNOSIS — J3089 Other allergic rhinitis: Secondary | ICD-10-CM | POA: Diagnosis not present

## 2020-10-07 DIAGNOSIS — J301 Allergic rhinitis due to pollen: Secondary | ICD-10-CM | POA: Diagnosis not present

## 2020-10-08 DIAGNOSIS — E7800 Pure hypercholesterolemia, unspecified: Secondary | ICD-10-CM | POA: Diagnosis not present

## 2020-10-08 DIAGNOSIS — Z0000 Encounter for general adult medical examination without abnormal findings: Secondary | ICD-10-CM | POA: Diagnosis not present

## 2020-10-08 DIAGNOSIS — E559 Vitamin D deficiency, unspecified: Secondary | ICD-10-CM | POA: Diagnosis not present

## 2020-10-08 DIAGNOSIS — K5900 Constipation, unspecified: Secondary | ICD-10-CM | POA: Diagnosis not present

## 2020-10-08 DIAGNOSIS — I10 Essential (primary) hypertension: Secondary | ICD-10-CM | POA: Diagnosis not present

## 2020-10-14 DIAGNOSIS — J301 Allergic rhinitis due to pollen: Secondary | ICD-10-CM | POA: Diagnosis not present

## 2020-10-14 DIAGNOSIS — J3089 Other allergic rhinitis: Secondary | ICD-10-CM | POA: Diagnosis not present

## 2020-10-21 DIAGNOSIS — J301 Allergic rhinitis due to pollen: Secondary | ICD-10-CM | POA: Diagnosis not present

## 2020-10-21 DIAGNOSIS — J3089 Other allergic rhinitis: Secondary | ICD-10-CM | POA: Diagnosis not present

## 2020-10-28 DIAGNOSIS — D1801 Hemangioma of skin and subcutaneous tissue: Secondary | ICD-10-CM | POA: Diagnosis not present

## 2020-10-28 DIAGNOSIS — L821 Other seborrheic keratosis: Secondary | ICD-10-CM | POA: Diagnosis not present

## 2020-10-28 DIAGNOSIS — D225 Melanocytic nevi of trunk: Secondary | ICD-10-CM | POA: Diagnosis not present

## 2020-10-28 DIAGNOSIS — D2261 Melanocytic nevi of right upper limb, including shoulder: Secondary | ICD-10-CM | POA: Diagnosis not present

## 2020-10-28 DIAGNOSIS — J3089 Other allergic rhinitis: Secondary | ICD-10-CM | POA: Diagnosis not present

## 2020-10-28 DIAGNOSIS — J301 Allergic rhinitis due to pollen: Secondary | ICD-10-CM | POA: Diagnosis not present

## 2020-10-29 DIAGNOSIS — I10 Essential (primary) hypertension: Secondary | ICD-10-CM | POA: Diagnosis not present

## 2020-10-29 DIAGNOSIS — K5900 Constipation, unspecified: Secondary | ICD-10-CM | POA: Diagnosis not present

## 2020-10-29 DIAGNOSIS — E559 Vitamin D deficiency, unspecified: Secondary | ICD-10-CM | POA: Diagnosis not present

## 2020-10-29 DIAGNOSIS — E7800 Pure hypercholesterolemia, unspecified: Secondary | ICD-10-CM | POA: Diagnosis not present

## 2020-10-29 DIAGNOSIS — Z0000 Encounter for general adult medical examination without abnormal findings: Secondary | ICD-10-CM | POA: Diagnosis not present

## 2020-11-02 ENCOUNTER — Other Ambulatory Visit: Payer: Medicare HMO

## 2020-11-04 DIAGNOSIS — J301 Allergic rhinitis due to pollen: Secondary | ICD-10-CM | POA: Diagnosis not present

## 2020-11-04 DIAGNOSIS — J3089 Other allergic rhinitis: Secondary | ICD-10-CM | POA: Diagnosis not present

## 2020-11-11 DIAGNOSIS — J3089 Other allergic rhinitis: Secondary | ICD-10-CM | POA: Diagnosis not present

## 2020-11-11 DIAGNOSIS — J301 Allergic rhinitis due to pollen: Secondary | ICD-10-CM | POA: Diagnosis not present

## 2020-11-16 DIAGNOSIS — J301 Allergic rhinitis due to pollen: Secondary | ICD-10-CM | POA: Diagnosis not present

## 2020-11-16 DIAGNOSIS — J3089 Other allergic rhinitis: Secondary | ICD-10-CM | POA: Diagnosis not present

## 2020-11-19 DIAGNOSIS — R1310 Dysphagia, unspecified: Secondary | ICD-10-CM | POA: Diagnosis not present

## 2020-11-19 DIAGNOSIS — K5900 Constipation, unspecified: Secondary | ICD-10-CM | POA: Diagnosis not present

## 2020-11-19 DIAGNOSIS — K219 Gastro-esophageal reflux disease without esophagitis: Secondary | ICD-10-CM | POA: Diagnosis not present

## 2020-11-19 DIAGNOSIS — R194 Change in bowel habit: Secondary | ICD-10-CM | POA: Diagnosis not present

## 2020-11-25 DIAGNOSIS — J3089 Other allergic rhinitis: Secondary | ICD-10-CM | POA: Diagnosis not present

## 2020-11-25 DIAGNOSIS — J301 Allergic rhinitis due to pollen: Secondary | ICD-10-CM | POA: Diagnosis not present

## 2020-11-30 DIAGNOSIS — J3089 Other allergic rhinitis: Secondary | ICD-10-CM | POA: Diagnosis not present

## 2020-11-30 DIAGNOSIS — J301 Allergic rhinitis due to pollen: Secondary | ICD-10-CM | POA: Diagnosis not present

## 2020-12-04 ENCOUNTER — Ambulatory Visit
Admission: RE | Admit: 2020-12-04 | Discharge: 2020-12-04 | Disposition: A | Discharge status: Home / Self Care | Payer: Medicare HMO | Source: Ambulatory Visit | Attending: Obstetrics and Gynecology | Admitting: Obstetrics and Gynecology

## 2020-12-04 ENCOUNTER — Other Ambulatory Visit: Payer: Self-pay | Admitting: Obstetrics and Gynecology

## 2020-12-04 ENCOUNTER — Other Ambulatory Visit: Payer: Self-pay

## 2020-12-04 DIAGNOSIS — N6315 Unspecified lump in the right breast, overlapping quadrants: Secondary | ICD-10-CM

## 2020-12-04 DIAGNOSIS — N644 Mastodynia: Secondary | ICD-10-CM | POA: Diagnosis not present

## 2020-12-04 DIAGNOSIS — R922 Inconclusive mammogram: Secondary | ICD-10-CM | POA: Diagnosis not present

## 2020-12-07 DIAGNOSIS — J301 Allergic rhinitis due to pollen: Secondary | ICD-10-CM | POA: Diagnosis not present

## 2020-12-07 DIAGNOSIS — J3089 Other allergic rhinitis: Secondary | ICD-10-CM | POA: Diagnosis not present

## 2020-12-14 DIAGNOSIS — J301 Allergic rhinitis due to pollen: Secondary | ICD-10-CM | POA: Diagnosis not present

## 2020-12-14 DIAGNOSIS — J3089 Other allergic rhinitis: Secondary | ICD-10-CM | POA: Diagnosis not present

## 2020-12-18 DIAGNOSIS — Z01812 Encounter for preprocedural laboratory examination: Secondary | ICD-10-CM | POA: Diagnosis not present

## 2020-12-21 DIAGNOSIS — J301 Allergic rhinitis due to pollen: Secondary | ICD-10-CM | POA: Diagnosis not present

## 2020-12-21 DIAGNOSIS — J3089 Other allergic rhinitis: Secondary | ICD-10-CM | POA: Diagnosis not present

## 2020-12-23 DIAGNOSIS — R1310 Dysphagia, unspecified: Secondary | ICD-10-CM | POA: Diagnosis not present

## 2020-12-23 DIAGNOSIS — K2930 Chronic superficial gastritis without bleeding: Secondary | ICD-10-CM | POA: Diagnosis not present

## 2020-12-23 DIAGNOSIS — K2289 Other specified disease of esophagus: Secondary | ICD-10-CM | POA: Diagnosis not present

## 2020-12-23 DIAGNOSIS — K621 Rectal polyp: Secondary | ICD-10-CM | POA: Diagnosis not present

## 2020-12-23 DIAGNOSIS — K222 Esophageal obstruction: Secondary | ICD-10-CM | POA: Diagnosis not present

## 2020-12-23 DIAGNOSIS — Z86010 Personal history of colonic polyps: Secondary | ICD-10-CM | POA: Diagnosis not present

## 2020-12-23 DIAGNOSIS — D123 Benign neoplasm of transverse colon: Secondary | ICD-10-CM | POA: Diagnosis not present

## 2020-12-23 DIAGNOSIS — K5730 Diverticulosis of large intestine without perforation or abscess without bleeding: Secondary | ICD-10-CM | POA: Diagnosis not present

## 2020-12-23 DIAGNOSIS — K3189 Other diseases of stomach and duodenum: Secondary | ICD-10-CM | POA: Diagnosis not present

## 2020-12-23 DIAGNOSIS — K648 Other hemorrhoids: Secondary | ICD-10-CM | POA: Diagnosis not present

## 2020-12-24 DIAGNOSIS — M9901 Segmental and somatic dysfunction of cervical region: Secondary | ICD-10-CM | POA: Diagnosis not present

## 2020-12-24 DIAGNOSIS — M5136 Other intervertebral disc degeneration, lumbar region: Secondary | ICD-10-CM | POA: Diagnosis not present

## 2020-12-24 DIAGNOSIS — M5031 Other cervical disc degeneration,  high cervical region: Secondary | ICD-10-CM | POA: Diagnosis not present

## 2020-12-24 DIAGNOSIS — M9903 Segmental and somatic dysfunction of lumbar region: Secondary | ICD-10-CM | POA: Diagnosis not present

## 2020-12-28 DIAGNOSIS — J301 Allergic rhinitis due to pollen: Secondary | ICD-10-CM | POA: Diagnosis not present

## 2020-12-28 DIAGNOSIS — J3089 Other allergic rhinitis: Secondary | ICD-10-CM | POA: Diagnosis not present

## 2020-12-29 DIAGNOSIS — D123 Benign neoplasm of transverse colon: Secondary | ICD-10-CM | POA: Diagnosis not present

## 2020-12-29 DIAGNOSIS — K2289 Other specified disease of esophagus: Secondary | ICD-10-CM | POA: Diagnosis not present

## 2020-12-29 DIAGNOSIS — K2930 Chronic superficial gastritis without bleeding: Secondary | ICD-10-CM | POA: Diagnosis not present

## 2020-12-29 DIAGNOSIS — K621 Rectal polyp: Secondary | ICD-10-CM | POA: Diagnosis not present

## 2020-12-31 ENCOUNTER — Other Ambulatory Visit: Payer: Self-pay | Admitting: Internal Medicine

## 2020-12-31 NOTE — Telephone Encounter (Signed)
Clonazepam refilled 

## 2020-12-31 NOTE — Telephone Encounter (Signed)
Dr. Annamaria Boots, please advise if you are okay refilling med.  Allergies  Allergen Reactions  . Cefzil [Cefprozil]   . Erythromycin   . Sulfa Antibiotics      Current Outpatient Medications:  .  albuterol (PROVENTIL HFA;VENTOLIN HFA) 108 (90 Base) MCG/ACT inhaler, Inhale 1-2 puffs into the lungs every 6 (six) hours as needed for wheezing or shortness of breath., Disp: , Rfl:  .  azelastine (OPTIVAR) 0.05 % ophthalmic solution, INSTILL 1 DROP TO AFFECTED EYE(S) TWICE A DAY, Disp: , Rfl: 2 .  clonazePAM (KLONOPIN) 0.5 MG tablet, TAKE 1-3 TABLETS BY MOUTH DAILY FOR SLEEP IF NEEDED, Disp: 90 tablet, Rfl: 5 .  Coenzyme Q10 (COQ10) 200 MG CAPS, Take 1 capsule by mouth daily., Disp: , Rfl:  .  Cyanocobalamin (VITAMIN B-12) 500 MCG SUBL, Place 1 tablet under the tongue daily after lunch., Disp: , Rfl:  .  EPIPEN 2-PAK 0.3 MG/0.3ML DEVI, , Disp: , Rfl:  .  ergocalciferol (DRISDOL) 8000 UNIT/ML drops, 4 drops qd sublingual (Patient not taking: Reported on 08/19/2020), Disp: , Rfl:  .  lactobacillus acidophilus (BACID) TABS tablet, Take 1 tablet by mouth daily., Disp: , Rfl:  .  milk thistle 175 MG tablet, Take 175 mg by mouth daily., Disp: , Rfl:  .  Multiple Vitamins-Minerals (ANTIOXIDANT PO), Take 2 capsules by mouth daily., Disp: , Rfl:  .  OMEGA 3 1000 MG CAPS, Take 1 capsule by mouth daily., Disp: , Rfl:  .  TURMERIC PO, Take by mouth. (Patient not taking: Reported on 08/19/2020), Disp: , Rfl:

## 2021-01-04 DIAGNOSIS — J3089 Other allergic rhinitis: Secondary | ICD-10-CM | POA: Diagnosis not present

## 2021-01-04 DIAGNOSIS — J301 Allergic rhinitis due to pollen: Secondary | ICD-10-CM | POA: Diagnosis not present

## 2021-01-12 DIAGNOSIS — M5031 Other cervical disc degeneration,  high cervical region: Secondary | ICD-10-CM | POA: Diagnosis not present

## 2021-01-12 DIAGNOSIS — M9901 Segmental and somatic dysfunction of cervical region: Secondary | ICD-10-CM | POA: Diagnosis not present

## 2021-01-12 DIAGNOSIS — M9903 Segmental and somatic dysfunction of lumbar region: Secondary | ICD-10-CM | POA: Diagnosis not present

## 2021-01-12 DIAGNOSIS — M5136 Other intervertebral disc degeneration, lumbar region: Secondary | ICD-10-CM | POA: Diagnosis not present

## 2021-01-13 DIAGNOSIS — J3089 Other allergic rhinitis: Secondary | ICD-10-CM | POA: Diagnosis not present

## 2021-01-13 DIAGNOSIS — J301 Allergic rhinitis due to pollen: Secondary | ICD-10-CM | POA: Diagnosis not present

## 2021-01-13 DIAGNOSIS — Z1501 Genetic susceptibility to malignant neoplasm of breast: Secondary | ICD-10-CM | POA: Diagnosis not present

## 2021-01-13 DIAGNOSIS — N6310 Unspecified lump in the right breast, unspecified quadrant: Secondary | ICD-10-CM | POA: Diagnosis not present

## 2021-01-14 ENCOUNTER — Other Ambulatory Visit: Payer: Self-pay | Admitting: Obstetrics and Gynecology

## 2021-01-14 DIAGNOSIS — Z1509 Genetic susceptibility to other malignant neoplasm: Secondary | ICD-10-CM

## 2021-01-14 DIAGNOSIS — Z1501 Genetic susceptibility to malignant neoplasm of breast: Secondary | ICD-10-CM

## 2021-01-18 DIAGNOSIS — J301 Allergic rhinitis due to pollen: Secondary | ICD-10-CM | POA: Diagnosis not present

## 2021-01-18 DIAGNOSIS — J3089 Other allergic rhinitis: Secondary | ICD-10-CM | POA: Diagnosis not present

## 2021-01-25 DIAGNOSIS — J301 Allergic rhinitis due to pollen: Secondary | ICD-10-CM | POA: Diagnosis not present

## 2021-01-25 DIAGNOSIS — J3089 Other allergic rhinitis: Secondary | ICD-10-CM | POA: Diagnosis not present

## 2021-02-01 DIAGNOSIS — J301 Allergic rhinitis due to pollen: Secondary | ICD-10-CM | POA: Diagnosis not present

## 2021-02-01 DIAGNOSIS — J3089 Other allergic rhinitis: Secondary | ICD-10-CM | POA: Diagnosis not present

## 2021-02-03 DIAGNOSIS — M9901 Segmental and somatic dysfunction of cervical region: Secondary | ICD-10-CM | POA: Diagnosis not present

## 2021-02-03 DIAGNOSIS — M5031 Other cervical disc degeneration,  high cervical region: Secondary | ICD-10-CM | POA: Diagnosis not present

## 2021-02-03 DIAGNOSIS — M5136 Other intervertebral disc degeneration, lumbar region: Secondary | ICD-10-CM | POA: Diagnosis not present

## 2021-02-03 DIAGNOSIS — M9903 Segmental and somatic dysfunction of lumbar region: Secondary | ICD-10-CM | POA: Diagnosis not present

## 2021-02-08 DIAGNOSIS — J301 Allergic rhinitis due to pollen: Secondary | ICD-10-CM | POA: Diagnosis not present

## 2021-02-08 DIAGNOSIS — J3089 Other allergic rhinitis: Secondary | ICD-10-CM | POA: Diagnosis not present

## 2021-02-09 DIAGNOSIS — I10 Essential (primary) hypertension: Secondary | ICD-10-CM | POA: Diagnosis not present

## 2021-02-09 DIAGNOSIS — R69 Illness, unspecified: Secondary | ICD-10-CM | POA: Diagnosis not present

## 2021-02-09 DIAGNOSIS — K219 Gastro-esophageal reflux disease without esophagitis: Secondary | ICD-10-CM | POA: Diagnosis not present

## 2021-02-09 DIAGNOSIS — K5900 Constipation, unspecified: Secondary | ICD-10-CM | POA: Diagnosis not present

## 2021-02-09 DIAGNOSIS — J45909 Unspecified asthma, uncomplicated: Secondary | ICD-10-CM | POA: Diagnosis not present

## 2021-02-09 DIAGNOSIS — L309 Dermatitis, unspecified: Secondary | ICD-10-CM | POA: Diagnosis not present

## 2021-02-09 DIAGNOSIS — E663 Overweight: Secondary | ICD-10-CM | POA: Diagnosis not present

## 2021-02-09 DIAGNOSIS — G4700 Insomnia, unspecified: Secondary | ICD-10-CM | POA: Diagnosis not present

## 2021-02-09 DIAGNOSIS — E785 Hyperlipidemia, unspecified: Secondary | ICD-10-CM | POA: Diagnosis not present

## 2021-02-09 DIAGNOSIS — M1990 Unspecified osteoarthritis, unspecified site: Secondary | ICD-10-CM | POA: Diagnosis not present

## 2021-02-17 DIAGNOSIS — M5136 Other intervertebral disc degeneration, lumbar region: Secondary | ICD-10-CM | POA: Diagnosis not present

## 2021-02-17 DIAGNOSIS — M9903 Segmental and somatic dysfunction of lumbar region: Secondary | ICD-10-CM | POA: Diagnosis not present

## 2021-02-17 DIAGNOSIS — M5031 Other cervical disc degeneration,  high cervical region: Secondary | ICD-10-CM | POA: Diagnosis not present

## 2021-02-17 DIAGNOSIS — J3089 Other allergic rhinitis: Secondary | ICD-10-CM | POA: Diagnosis not present

## 2021-02-17 DIAGNOSIS — J301 Allergic rhinitis due to pollen: Secondary | ICD-10-CM | POA: Diagnosis not present

## 2021-02-17 DIAGNOSIS — M9901 Segmental and somatic dysfunction of cervical region: Secondary | ICD-10-CM | POA: Diagnosis not present

## 2021-02-24 DIAGNOSIS — M9904 Segmental and somatic dysfunction of sacral region: Secondary | ICD-10-CM | POA: Diagnosis not present

## 2021-02-24 DIAGNOSIS — M5136 Other intervertebral disc degeneration, lumbar region: Secondary | ICD-10-CM | POA: Diagnosis not present

## 2021-02-24 DIAGNOSIS — M9905 Segmental and somatic dysfunction of pelvic region: Secondary | ICD-10-CM | POA: Diagnosis not present

## 2021-02-24 DIAGNOSIS — M9903 Segmental and somatic dysfunction of lumbar region: Secondary | ICD-10-CM | POA: Diagnosis not present

## 2021-03-01 DIAGNOSIS — H61309 Acquired stenosis of external ear canal, unspecified, unspecified ear: Secondary | ICD-10-CM | POA: Diagnosis not present

## 2021-03-01 DIAGNOSIS — H903 Sensorineural hearing loss, bilateral: Secondary | ICD-10-CM | POA: Diagnosis not present

## 2021-03-01 DIAGNOSIS — J301 Allergic rhinitis due to pollen: Secondary | ICD-10-CM | POA: Diagnosis not present

## 2021-03-01 DIAGNOSIS — J3089 Other allergic rhinitis: Secondary | ICD-10-CM | POA: Diagnosis not present

## 2021-03-01 DIAGNOSIS — H9312 Tinnitus, left ear: Secondary | ICD-10-CM | POA: Diagnosis not present

## 2021-03-02 DIAGNOSIS — H5203 Hypermetropia, bilateral: Secondary | ICD-10-CM | POA: Diagnosis not present

## 2021-03-02 DIAGNOSIS — H2513 Age-related nuclear cataract, bilateral: Secondary | ICD-10-CM | POA: Diagnosis not present

## 2021-03-02 DIAGNOSIS — H52203 Unspecified astigmatism, bilateral: Secondary | ICD-10-CM | POA: Diagnosis not present

## 2021-03-03 DIAGNOSIS — J301 Allergic rhinitis due to pollen: Secondary | ICD-10-CM | POA: Diagnosis not present

## 2021-03-05 DIAGNOSIS — J3089 Other allergic rhinitis: Secondary | ICD-10-CM | POA: Diagnosis not present

## 2021-03-05 DIAGNOSIS — H1045 Other chronic allergic conjunctivitis: Secondary | ICD-10-CM | POA: Diagnosis not present

## 2021-03-05 DIAGNOSIS — J4520 Mild intermittent asthma, uncomplicated: Secondary | ICD-10-CM | POA: Diagnosis not present

## 2021-03-05 DIAGNOSIS — J301 Allergic rhinitis due to pollen: Secondary | ICD-10-CM | POA: Diagnosis not present

## 2021-03-08 DIAGNOSIS — J3089 Other allergic rhinitis: Secondary | ICD-10-CM | POA: Diagnosis not present

## 2021-03-08 DIAGNOSIS — J301 Allergic rhinitis due to pollen: Secondary | ICD-10-CM | POA: Diagnosis not present

## 2021-03-15 DIAGNOSIS — J301 Allergic rhinitis due to pollen: Secondary | ICD-10-CM | POA: Diagnosis not present

## 2021-03-15 DIAGNOSIS — J3089 Other allergic rhinitis: Secondary | ICD-10-CM | POA: Diagnosis not present

## 2021-03-18 DIAGNOSIS — M5136 Other intervertebral disc degeneration, lumbar region: Secondary | ICD-10-CM | POA: Diagnosis not present

## 2021-03-18 DIAGNOSIS — M9905 Segmental and somatic dysfunction of pelvic region: Secondary | ICD-10-CM | POA: Diagnosis not present

## 2021-03-18 DIAGNOSIS — M9904 Segmental and somatic dysfunction of sacral region: Secondary | ICD-10-CM | POA: Diagnosis not present

## 2021-03-18 DIAGNOSIS — M9903 Segmental and somatic dysfunction of lumbar region: Secondary | ICD-10-CM | POA: Diagnosis not present

## 2021-03-22 DIAGNOSIS — J3089 Other allergic rhinitis: Secondary | ICD-10-CM | POA: Diagnosis not present

## 2021-03-22 DIAGNOSIS — M5136 Other intervertebral disc degeneration, lumbar region: Secondary | ICD-10-CM | POA: Diagnosis not present

## 2021-03-22 DIAGNOSIS — M9905 Segmental and somatic dysfunction of pelvic region: Secondary | ICD-10-CM | POA: Diagnosis not present

## 2021-03-22 DIAGNOSIS — M9903 Segmental and somatic dysfunction of lumbar region: Secondary | ICD-10-CM | POA: Diagnosis not present

## 2021-03-22 DIAGNOSIS — J301 Allergic rhinitis due to pollen: Secondary | ICD-10-CM | POA: Diagnosis not present

## 2021-03-22 DIAGNOSIS — M9904 Segmental and somatic dysfunction of sacral region: Secondary | ICD-10-CM | POA: Diagnosis not present

## 2021-03-24 DIAGNOSIS — M9905 Segmental and somatic dysfunction of pelvic region: Secondary | ICD-10-CM | POA: Diagnosis not present

## 2021-03-24 DIAGNOSIS — M9904 Segmental and somatic dysfunction of sacral region: Secondary | ICD-10-CM | POA: Diagnosis not present

## 2021-03-24 DIAGNOSIS — M9903 Segmental and somatic dysfunction of lumbar region: Secondary | ICD-10-CM | POA: Diagnosis not present

## 2021-03-24 DIAGNOSIS — M5136 Other intervertebral disc degeneration, lumbar region: Secondary | ICD-10-CM | POA: Diagnosis not present

## 2021-03-29 DIAGNOSIS — M9903 Segmental and somatic dysfunction of lumbar region: Secondary | ICD-10-CM | POA: Diagnosis not present

## 2021-03-29 DIAGNOSIS — M9904 Segmental and somatic dysfunction of sacral region: Secondary | ICD-10-CM | POA: Diagnosis not present

## 2021-03-29 DIAGNOSIS — M5136 Other intervertebral disc degeneration, lumbar region: Secondary | ICD-10-CM | POA: Diagnosis not present

## 2021-03-29 DIAGNOSIS — J301 Allergic rhinitis due to pollen: Secondary | ICD-10-CM | POA: Diagnosis not present

## 2021-03-29 DIAGNOSIS — M9905 Segmental and somatic dysfunction of pelvic region: Secondary | ICD-10-CM | POA: Diagnosis not present

## 2021-03-29 DIAGNOSIS — J3089 Other allergic rhinitis: Secondary | ICD-10-CM | POA: Diagnosis not present

## 2021-04-07 DIAGNOSIS — J3089 Other allergic rhinitis: Secondary | ICD-10-CM | POA: Diagnosis not present

## 2021-04-07 DIAGNOSIS — J301 Allergic rhinitis due to pollen: Secondary | ICD-10-CM | POA: Diagnosis not present

## 2021-04-12 DIAGNOSIS — J301 Allergic rhinitis due to pollen: Secondary | ICD-10-CM | POA: Diagnosis not present

## 2021-04-12 DIAGNOSIS — J3089 Other allergic rhinitis: Secondary | ICD-10-CM | POA: Diagnosis not present

## 2021-04-19 DIAGNOSIS — J3089 Other allergic rhinitis: Secondary | ICD-10-CM | POA: Diagnosis not present

## 2021-04-19 DIAGNOSIS — J301 Allergic rhinitis due to pollen: Secondary | ICD-10-CM | POA: Diagnosis not present

## 2021-04-26 DIAGNOSIS — M5136 Other intervertebral disc degeneration, lumbar region: Secondary | ICD-10-CM | POA: Diagnosis not present

## 2021-04-26 DIAGNOSIS — J3081 Allergic rhinitis due to animal (cat) (dog) hair and dander: Secondary | ICD-10-CM | POA: Diagnosis not present

## 2021-04-26 DIAGNOSIS — J3089 Other allergic rhinitis: Secondary | ICD-10-CM | POA: Diagnosis not present

## 2021-04-26 DIAGNOSIS — M9903 Segmental and somatic dysfunction of lumbar region: Secondary | ICD-10-CM | POA: Diagnosis not present

## 2021-04-26 DIAGNOSIS — J301 Allergic rhinitis due to pollen: Secondary | ICD-10-CM | POA: Diagnosis not present

## 2021-04-26 DIAGNOSIS — M9901 Segmental and somatic dysfunction of cervical region: Secondary | ICD-10-CM | POA: Diagnosis not present

## 2021-04-26 DIAGNOSIS — M5031 Other cervical disc degeneration,  high cervical region: Secondary | ICD-10-CM | POA: Diagnosis not present

## 2021-05-03 DIAGNOSIS — J301 Allergic rhinitis due to pollen: Secondary | ICD-10-CM | POA: Diagnosis not present

## 2021-05-03 DIAGNOSIS — J3089 Other allergic rhinitis: Secondary | ICD-10-CM | POA: Diagnosis not present

## 2021-05-05 DIAGNOSIS — J301 Allergic rhinitis due to pollen: Secondary | ICD-10-CM | POA: Diagnosis not present

## 2021-05-12 DIAGNOSIS — J3089 Other allergic rhinitis: Secondary | ICD-10-CM | POA: Diagnosis not present

## 2021-05-12 DIAGNOSIS — J301 Allergic rhinitis due to pollen: Secondary | ICD-10-CM | POA: Diagnosis not present

## 2021-05-17 DIAGNOSIS — J301 Allergic rhinitis due to pollen: Secondary | ICD-10-CM | POA: Diagnosis not present

## 2021-05-17 DIAGNOSIS — J3089 Other allergic rhinitis: Secondary | ICD-10-CM | POA: Diagnosis not present

## 2021-05-17 DIAGNOSIS — M9903 Segmental and somatic dysfunction of lumbar region: Secondary | ICD-10-CM | POA: Diagnosis not present

## 2021-05-17 DIAGNOSIS — M9901 Segmental and somatic dysfunction of cervical region: Secondary | ICD-10-CM | POA: Diagnosis not present

## 2021-05-17 DIAGNOSIS — M5136 Other intervertebral disc degeneration, lumbar region: Secondary | ICD-10-CM | POA: Diagnosis not present

## 2021-05-17 DIAGNOSIS — M5031 Other cervical disc degeneration,  high cervical region: Secondary | ICD-10-CM | POA: Diagnosis not present

## 2021-05-24 DIAGNOSIS — J3089 Other allergic rhinitis: Secondary | ICD-10-CM | POA: Diagnosis not present

## 2021-05-24 DIAGNOSIS — J301 Allergic rhinitis due to pollen: Secondary | ICD-10-CM | POA: Diagnosis not present

## 2021-05-26 DIAGNOSIS — M5136 Other intervertebral disc degeneration, lumbar region: Secondary | ICD-10-CM | POA: Diagnosis not present

## 2021-05-26 DIAGNOSIS — M9904 Segmental and somatic dysfunction of sacral region: Secondary | ICD-10-CM | POA: Diagnosis not present

## 2021-05-26 DIAGNOSIS — M25561 Pain in right knee: Secondary | ICD-10-CM | POA: Diagnosis not present

## 2021-05-26 DIAGNOSIS — M9903 Segmental and somatic dysfunction of lumbar region: Secondary | ICD-10-CM | POA: Diagnosis not present

## 2021-05-26 DIAGNOSIS — J3089 Other allergic rhinitis: Secondary | ICD-10-CM | POA: Diagnosis not present

## 2021-05-26 DIAGNOSIS — M9905 Segmental and somatic dysfunction of pelvic region: Secondary | ICD-10-CM | POA: Diagnosis not present

## 2021-05-27 DIAGNOSIS — M5136 Other intervertebral disc degeneration, lumbar region: Secondary | ICD-10-CM | POA: Diagnosis not present

## 2021-05-27 DIAGNOSIS — M9903 Segmental and somatic dysfunction of lumbar region: Secondary | ICD-10-CM | POA: Diagnosis not present

## 2021-05-27 DIAGNOSIS — M9905 Segmental and somatic dysfunction of pelvic region: Secondary | ICD-10-CM | POA: Diagnosis not present

## 2021-05-27 DIAGNOSIS — M9904 Segmental and somatic dysfunction of sacral region: Secondary | ICD-10-CM | POA: Diagnosis not present

## 2021-06-02 DIAGNOSIS — M9904 Segmental and somatic dysfunction of sacral region: Secondary | ICD-10-CM | POA: Diagnosis not present

## 2021-06-02 DIAGNOSIS — J301 Allergic rhinitis due to pollen: Secondary | ICD-10-CM | POA: Diagnosis not present

## 2021-06-02 DIAGNOSIS — M9905 Segmental and somatic dysfunction of pelvic region: Secondary | ICD-10-CM | POA: Diagnosis not present

## 2021-06-02 DIAGNOSIS — J3089 Other allergic rhinitis: Secondary | ICD-10-CM | POA: Diagnosis not present

## 2021-06-02 DIAGNOSIS — M5136 Other intervertebral disc degeneration, lumbar region: Secondary | ICD-10-CM | POA: Diagnosis not present

## 2021-06-02 DIAGNOSIS — M9903 Segmental and somatic dysfunction of lumbar region: Secondary | ICD-10-CM | POA: Diagnosis not present

## 2021-06-03 DIAGNOSIS — M5136 Other intervertebral disc degeneration, lumbar region: Secondary | ICD-10-CM | POA: Diagnosis not present

## 2021-06-03 DIAGNOSIS — M9905 Segmental and somatic dysfunction of pelvic region: Secondary | ICD-10-CM | POA: Diagnosis not present

## 2021-06-03 DIAGNOSIS — M9904 Segmental and somatic dysfunction of sacral region: Secondary | ICD-10-CM | POA: Diagnosis not present

## 2021-06-03 DIAGNOSIS — M9903 Segmental and somatic dysfunction of lumbar region: Secondary | ICD-10-CM | POA: Diagnosis not present

## 2021-06-07 DIAGNOSIS — J301 Allergic rhinitis due to pollen: Secondary | ICD-10-CM | POA: Diagnosis not present

## 2021-06-07 DIAGNOSIS — J3089 Other allergic rhinitis: Secondary | ICD-10-CM | POA: Diagnosis not present

## 2021-06-07 DIAGNOSIS — M9905 Segmental and somatic dysfunction of pelvic region: Secondary | ICD-10-CM | POA: Diagnosis not present

## 2021-06-07 DIAGNOSIS — M5136 Other intervertebral disc degeneration, lumbar region: Secondary | ICD-10-CM | POA: Diagnosis not present

## 2021-06-07 DIAGNOSIS — M9903 Segmental and somatic dysfunction of lumbar region: Secondary | ICD-10-CM | POA: Diagnosis not present

## 2021-06-07 DIAGNOSIS — M9904 Segmental and somatic dysfunction of sacral region: Secondary | ICD-10-CM | POA: Diagnosis not present

## 2021-06-10 DIAGNOSIS — M9903 Segmental and somatic dysfunction of lumbar region: Secondary | ICD-10-CM | POA: Diagnosis not present

## 2021-06-10 DIAGNOSIS — M9904 Segmental and somatic dysfunction of sacral region: Secondary | ICD-10-CM | POA: Diagnosis not present

## 2021-06-10 DIAGNOSIS — M9905 Segmental and somatic dysfunction of pelvic region: Secondary | ICD-10-CM | POA: Diagnosis not present

## 2021-06-10 DIAGNOSIS — M5136 Other intervertebral disc degeneration, lumbar region: Secondary | ICD-10-CM | POA: Diagnosis not present

## 2021-06-14 DIAGNOSIS — J301 Allergic rhinitis due to pollen: Secondary | ICD-10-CM | POA: Diagnosis not present

## 2021-06-14 DIAGNOSIS — J3089 Other allergic rhinitis: Secondary | ICD-10-CM | POA: Diagnosis not present

## 2021-06-16 DIAGNOSIS — I499 Cardiac arrhythmia, unspecified: Secondary | ICD-10-CM | POA: Diagnosis not present

## 2021-06-16 DIAGNOSIS — R0982 Postnasal drip: Secondary | ICD-10-CM | POA: Diagnosis not present

## 2021-06-16 DIAGNOSIS — M9904 Segmental and somatic dysfunction of sacral region: Secondary | ICD-10-CM | POA: Diagnosis not present

## 2021-06-16 DIAGNOSIS — M9903 Segmental and somatic dysfunction of lumbar region: Secondary | ICD-10-CM | POA: Diagnosis not present

## 2021-06-16 DIAGNOSIS — M5136 Other intervertebral disc degeneration, lumbar region: Secondary | ICD-10-CM | POA: Diagnosis not present

## 2021-06-16 DIAGNOSIS — R059 Cough, unspecified: Secondary | ICD-10-CM | POA: Diagnosis not present

## 2021-06-16 DIAGNOSIS — R609 Edema, unspecified: Secondary | ICD-10-CM | POA: Diagnosis not present

## 2021-06-16 DIAGNOSIS — R252 Cramp and spasm: Secondary | ICD-10-CM | POA: Diagnosis not present

## 2021-06-16 DIAGNOSIS — I10 Essential (primary) hypertension: Secondary | ICD-10-CM | POA: Diagnosis not present

## 2021-06-16 DIAGNOSIS — M9905 Segmental and somatic dysfunction of pelvic region: Secondary | ICD-10-CM | POA: Diagnosis not present

## 2021-06-21 DIAGNOSIS — J301 Allergic rhinitis due to pollen: Secondary | ICD-10-CM | POA: Diagnosis not present

## 2021-06-21 DIAGNOSIS — J3089 Other allergic rhinitis: Secondary | ICD-10-CM | POA: Diagnosis not present

## 2021-06-24 DIAGNOSIS — M5136 Other intervertebral disc degeneration, lumbar region: Secondary | ICD-10-CM | POA: Diagnosis not present

## 2021-06-24 DIAGNOSIS — M9904 Segmental and somatic dysfunction of sacral region: Secondary | ICD-10-CM | POA: Diagnosis not present

## 2021-06-24 DIAGNOSIS — M9903 Segmental and somatic dysfunction of lumbar region: Secondary | ICD-10-CM | POA: Diagnosis not present

## 2021-06-24 DIAGNOSIS — M9905 Segmental and somatic dysfunction of pelvic region: Secondary | ICD-10-CM | POA: Diagnosis not present

## 2021-06-28 DIAGNOSIS — J3089 Other allergic rhinitis: Secondary | ICD-10-CM | POA: Diagnosis not present

## 2021-06-28 DIAGNOSIS — J301 Allergic rhinitis due to pollen: Secondary | ICD-10-CM | POA: Diagnosis not present

## 2021-07-02 ENCOUNTER — Ambulatory Visit
Admission: RE | Admit: 2021-07-02 | Discharge: 2021-07-02 | Disposition: A | Discharge status: Home / Self Care | Payer: Medicare HMO | Source: Ambulatory Visit | Attending: Obstetrics and Gynecology | Admitting: Obstetrics and Gynecology

## 2021-07-02 ENCOUNTER — Other Ambulatory Visit: Payer: Self-pay

## 2021-07-02 DIAGNOSIS — R928 Other abnormal and inconclusive findings on diagnostic imaging of breast: Secondary | ICD-10-CM | POA: Diagnosis not present

## 2021-07-02 DIAGNOSIS — Z1501 Genetic susceptibility to malignant neoplasm of breast: Secondary | ICD-10-CM

## 2021-07-02 DIAGNOSIS — N6489 Other specified disorders of breast: Secondary | ICD-10-CM | POA: Diagnosis not present

## 2021-07-02 MED ORDER — GADOBUTROL 1 MMOL/ML IV SOLN
8.0000 mL | Freq: Once | INTRAVENOUS | Status: AC | PRN
  Administered 2021-07-02: 8 mL via INTRAVENOUS

## 2021-07-05 DIAGNOSIS — J301 Allergic rhinitis due to pollen: Secondary | ICD-10-CM | POA: Diagnosis not present

## 2021-07-05 DIAGNOSIS — M9903 Segmental and somatic dysfunction of lumbar region: Secondary | ICD-10-CM | POA: Diagnosis not present

## 2021-07-05 DIAGNOSIS — M9905 Segmental and somatic dysfunction of pelvic region: Secondary | ICD-10-CM | POA: Diagnosis not present

## 2021-07-05 DIAGNOSIS — M9904 Segmental and somatic dysfunction of sacral region: Secondary | ICD-10-CM | POA: Diagnosis not present

## 2021-07-05 DIAGNOSIS — J3089 Other allergic rhinitis: Secondary | ICD-10-CM | POA: Diagnosis not present

## 2021-07-05 DIAGNOSIS — M5136 Other intervertebral disc degeneration, lumbar region: Secondary | ICD-10-CM | POA: Diagnosis not present

## 2021-07-21 DIAGNOSIS — J3089 Other allergic rhinitis: Secondary | ICD-10-CM | POA: Diagnosis not present

## 2021-07-21 DIAGNOSIS — J301 Allergic rhinitis due to pollen: Secondary | ICD-10-CM | POA: Diagnosis not present

## 2021-07-26 ENCOUNTER — Other Ambulatory Visit: Payer: Self-pay | Admitting: Obstetrics and Gynecology

## 2021-07-26 DIAGNOSIS — M5136 Other intervertebral disc degeneration, lumbar region: Secondary | ICD-10-CM | POA: Diagnosis not present

## 2021-07-26 DIAGNOSIS — J301 Allergic rhinitis due to pollen: Secondary | ICD-10-CM | POA: Diagnosis not present

## 2021-07-26 DIAGNOSIS — Z1231 Encounter for screening mammogram for malignant neoplasm of breast: Secondary | ICD-10-CM

## 2021-07-26 DIAGNOSIS — M9905 Segmental and somatic dysfunction of pelvic region: Secondary | ICD-10-CM | POA: Diagnosis not present

## 2021-07-26 DIAGNOSIS — M9904 Segmental and somatic dysfunction of sacral region: Secondary | ICD-10-CM | POA: Diagnosis not present

## 2021-07-26 DIAGNOSIS — M9903 Segmental and somatic dysfunction of lumbar region: Secondary | ICD-10-CM | POA: Diagnosis not present

## 2021-07-26 DIAGNOSIS — J3089 Other allergic rhinitis: Secondary | ICD-10-CM | POA: Diagnosis not present

## 2021-08-02 DIAGNOSIS — J301 Allergic rhinitis due to pollen: Secondary | ICD-10-CM | POA: Diagnosis not present

## 2021-08-02 DIAGNOSIS — M5136 Other intervertebral disc degeneration, lumbar region: Secondary | ICD-10-CM | POA: Diagnosis not present

## 2021-08-02 DIAGNOSIS — M9903 Segmental and somatic dysfunction of lumbar region: Secondary | ICD-10-CM | POA: Diagnosis not present

## 2021-08-02 DIAGNOSIS — M9905 Segmental and somatic dysfunction of pelvic region: Secondary | ICD-10-CM | POA: Diagnosis not present

## 2021-08-02 DIAGNOSIS — M9904 Segmental and somatic dysfunction of sacral region: Secondary | ICD-10-CM | POA: Diagnosis not present

## 2021-08-02 DIAGNOSIS — J3089 Other allergic rhinitis: Secondary | ICD-10-CM | POA: Diagnosis not present

## 2021-08-09 DIAGNOSIS — J301 Allergic rhinitis due to pollen: Secondary | ICD-10-CM | POA: Diagnosis not present

## 2021-08-09 DIAGNOSIS — J3089 Other allergic rhinitis: Secondary | ICD-10-CM | POA: Diagnosis not present

## 2021-08-11 DIAGNOSIS — M9905 Segmental and somatic dysfunction of pelvic region: Secondary | ICD-10-CM | POA: Diagnosis not present

## 2021-08-11 DIAGNOSIS — M9903 Segmental and somatic dysfunction of lumbar region: Secondary | ICD-10-CM | POA: Diagnosis not present

## 2021-08-11 DIAGNOSIS — M9904 Segmental and somatic dysfunction of sacral region: Secondary | ICD-10-CM | POA: Diagnosis not present

## 2021-08-11 DIAGNOSIS — M5136 Other intervertebral disc degeneration, lumbar region: Secondary | ICD-10-CM | POA: Diagnosis not present

## 2021-08-16 DIAGNOSIS — J3089 Other allergic rhinitis: Secondary | ICD-10-CM | POA: Diagnosis not present

## 2021-08-16 DIAGNOSIS — M9905 Segmental and somatic dysfunction of pelvic region: Secondary | ICD-10-CM | POA: Diagnosis not present

## 2021-08-16 DIAGNOSIS — M5136 Other intervertebral disc degeneration, lumbar region: Secondary | ICD-10-CM | POA: Diagnosis not present

## 2021-08-16 DIAGNOSIS — M9904 Segmental and somatic dysfunction of sacral region: Secondary | ICD-10-CM | POA: Diagnosis not present

## 2021-08-16 DIAGNOSIS — J301 Allergic rhinitis due to pollen: Secondary | ICD-10-CM | POA: Diagnosis not present

## 2021-08-16 DIAGNOSIS — M9903 Segmental and somatic dysfunction of lumbar region: Secondary | ICD-10-CM | POA: Diagnosis not present

## 2021-08-18 DIAGNOSIS — M9904 Segmental and somatic dysfunction of sacral region: Secondary | ICD-10-CM | POA: Diagnosis not present

## 2021-08-18 DIAGNOSIS — M9905 Segmental and somatic dysfunction of pelvic region: Secondary | ICD-10-CM | POA: Diagnosis not present

## 2021-08-18 DIAGNOSIS — M9903 Segmental and somatic dysfunction of lumbar region: Secondary | ICD-10-CM | POA: Diagnosis not present

## 2021-08-18 DIAGNOSIS — M5136 Other intervertebral disc degeneration, lumbar region: Secondary | ICD-10-CM | POA: Diagnosis not present

## 2021-08-18 NOTE — Progress Notes (Signed)
4eeHPI female former smoker followed for chronic insomnia since fall with hand injury/reflex sympathetic dystrophy., Complicated by allergic rhinitis (Dr. Donneta Romberg), GERD Says she failed trazodone and Lunesta. She is afraid of what she has heard about Ambien side effects/sleepwalking her zaleplon "relaxes her" so she can go to sleep but often lets her wake during the night.   ----------------------------------------------------------------------------------.   08/19/20- 08/20/2019-  75 year old female former smoker followed for chronic insomnia since fall with hand injury/reflex sympathetic dystrophy., Complicated by allergic rhinitis (Dr. Donneta Romberg), GERD Med Hx: Failed trazodone, Doxepin, Benadryl, temazepam, Lunesta, zaleplon,  Uncomfortable with reported Ambien side effects/sleepwalking.  Zaleplon did not last long enough Clonazepam 0.5 1-3 at hs Covid vax- 3 Phizer Flu- had Usually 1 tab clonazepam sufficient for sleep.  Husband at home more with Covid tele-teaching, and in apartment she still feels need to get away. Suggested walking, cards with select friends at their homes, etc.  Continues allergy shots from Dr Donneta Romberg Discussed adding melatonin.  08/19/21- 08/20/2019-  75 year old female former smoker followed for chronic insomnia since fall with hand injury/reflex sympathetic dystrophy., Complicated by allergic rhinitis (Dr. Donneta Romberg), GERD, HTN,  Med Hx: Failed trazodone, Doxepin, Benadryl, temazepam, Lunesta, zaleplon,  Uncomfortable with reported Ambien side effects/sleepwalking.  Zaleplon did not last long enough Clonazepam 0.5 1-3 at hs Covid vax- 4 Phizer Flu vax- had -----Pt states she have a few concerns Sleeping much better with clonazepam, which also helps keep her calmer in daytime. Her ritual is to read for 30 minutes after taking it, with a low blue-light reading lamp. Does not feel over medicated or unsteady. Quality of life seems much improved. No hx thyroid  problems.  ROS-see HPI + = positive Constitutional:   No-   weight loss, night sweats, fevers, chills, fatigue, lassitude. HEENT:   + headaches, no-difficulty swallowing, tooth/dental problems, sore throat,       No-  sneezing, itching, ear ache, nasal congestion, post nasal drip,  CV:  No-   chest pain, orthopnea, PND, +swelling in lower extremities, anasarca,  dizziness, palpitations Resp: No-   shortness of breath with exertion or at rest.              No-   productive cough,  No non-productive cough,  No- coughing up of blood.              No-   change in color of mucus.  No- wheezing.   Skin: No-   rash or lesions. GI:  No-   heartburn, indigestion, abdominal pain, nausea, vomiting,  GU:  MS:  No-   joint pain or swelling.   Neuro-     nothing unusual Psych:  No- change in mood or affect. No depression or+ anxiety.  No memory loss.  OBJ- Physical Exam General- Alert, Oriented, Affect-appropriate, Distress- none acute. Appears well.  Skin- rash-none, lesions- none, excoriation- none Lymphadenopathy- none Head- atraumatic            Eyes- Gross vision intact, PERRLA, conjunctivae and secretions clear            Ears- Hearing, canals-normal            Nose- Clear, no-Septal dev, mucus, polyps, erosion, perforation             Throat- Mallampati II , mucosa clear , drainage- none, tonsils- atrophic Neck- flexible , trachea midline, no stridor , thyroid nl, carotid no bruit Chest - symmetrical excursion , unlabored  Heart/CV- RRR , no murmur , no gallop  , no rub, nl s1 s2                           - JVD- none , edema- none, stasis changes- none, varices- none           Lung- clear to P&A, wheeze- none, cough- none , dullness-none, rub- none           Chest wall-  Abd-  Br/ Gen/ Rectal- Not done, not indicated Extrem- cyanosis- none, clubbing, none, atrophy- none, strength- nl Neuro- grossly intact to observation

## 2021-08-19 ENCOUNTER — Other Ambulatory Visit: Payer: Self-pay

## 2021-08-19 ENCOUNTER — Encounter: Payer: Self-pay | Admitting: Internal Medicine

## 2021-08-19 ENCOUNTER — Other Ambulatory Visit: Payer: Self-pay | Admitting: Internal Medicine

## 2021-08-19 ENCOUNTER — Ambulatory Visit: Payer: Medicare HMO | Admitting: Internal Medicine

## 2021-08-19 DIAGNOSIS — F5104 Psychophysiologic insomnia: Secondary | ICD-10-CM

## 2021-08-19 DIAGNOSIS — J302 Other seasonal allergic rhinitis: Secondary | ICD-10-CM

## 2021-08-19 DIAGNOSIS — J3089 Other allergic rhinitis: Secondary | ICD-10-CM

## 2021-08-19 DIAGNOSIS — R69 Illness, unspecified: Secondary | ICD-10-CM | POA: Diagnosis not present

## 2021-08-19 NOTE — Patient Instructions (Signed)
We can continue clonazepam - glad its working for you.  Please call if we can help

## 2021-08-19 NOTE — Assessment & Plan Note (Addendum)
Doing very well with clonazepam 0.5 mg for sleep. Med discussed. Sleep hygiene reviewed.  Plan- continue clonazepam. Continue attention to sleep hygiene

## 2021-08-20 NOTE — Telephone Encounter (Signed)
Received faxed refill request from pharmacy  Medication name/strength/dose: clonazepam 0.5mg   Medication last rx'd: 12/31/2020 Quantity and number of refills last rx'd: #90 with 5 refills  Instructions: 1-3 tabs daily as needed for sleep  Last OV: 08/19/21 Next OV: 08/19/2022  CY please advise on refill request  Allergies  Allergen Reactions   Cefzil [Cefprozil]    Erythromycin    Sulfa Antibiotics    Current Outpatient Medications on File Prior to Visit  Medication Sig Dispense Refill   albuterol (PROVENTIL HFA;VENTOLIN HFA) 108 (90 Base) MCG/ACT inhaler Inhale 1-2 puffs into the lungs every 6 (six) hours as needed for wheezing or shortness of breath.     azelastine (OPTIVAR) 0.05 % ophthalmic solution INSTILL 1 DROP TO AFFECTED EYE(S) TWICE A DAY  2   clonazePAM (KLONOPIN) 0.5 MG tablet TAKE 1-3 TABLETS BY MOUTH DAILY FOR SLEEP IF NEEDED 90 tablet 5   Coenzyme Q10 (COQ10) 200 MG CAPS Take 1 capsule by mouth daily. (Patient not taking: Reported on 08/19/2021)     Cyanocobalamin (VITAMIN B-12) 500 MCG SUBL Place 1 tablet under the tongue daily after lunch.     EPIPEN 2-PAK 0.3 MG/0.3ML DEVI      ergocalciferol (DRISDOL) 8000 UNIT/ML drops 4 drops qd sublingual (Patient not taking: No sig reported)     lactobacillus acidophilus (BACID) TABS tablet Take 1 tablet by mouth daily.     milk thistle 175 MG tablet Take 175 mg by mouth daily.     Multiple Vitamins-Minerals (ANTIOXIDANT PO) Take 2 capsules by mouth daily. (Patient not taking: Reported on 08/19/2021)     OMEGA 3 1000 MG CAPS Take 1 capsule by mouth daily. (Patient not taking: Reported on 08/19/2021)     TURMERIC PO Take by mouth. (Patient not taking: No sig reported)     No current facility-administered medications on file prior to visit.

## 2021-08-20 NOTE — Telephone Encounter (Signed)
Clonazepam refilled 

## 2021-08-23 DIAGNOSIS — M9903 Segmental and somatic dysfunction of lumbar region: Secondary | ICD-10-CM | POA: Diagnosis not present

## 2021-08-23 DIAGNOSIS — M9904 Segmental and somatic dysfunction of sacral region: Secondary | ICD-10-CM | POA: Diagnosis not present

## 2021-08-23 DIAGNOSIS — M5136 Other intervertebral disc degeneration, lumbar region: Secondary | ICD-10-CM | POA: Diagnosis not present

## 2021-08-23 DIAGNOSIS — M9905 Segmental and somatic dysfunction of pelvic region: Secondary | ICD-10-CM | POA: Diagnosis not present

## 2021-08-23 DIAGNOSIS — J3089 Other allergic rhinitis: Secondary | ICD-10-CM | POA: Diagnosis not present

## 2021-08-23 DIAGNOSIS — J301 Allergic rhinitis due to pollen: Secondary | ICD-10-CM | POA: Diagnosis not present

## 2021-08-26 DIAGNOSIS — M9905 Segmental and somatic dysfunction of pelvic region: Secondary | ICD-10-CM | POA: Diagnosis not present

## 2021-08-26 DIAGNOSIS — M9904 Segmental and somatic dysfunction of sacral region: Secondary | ICD-10-CM | POA: Diagnosis not present

## 2021-08-26 DIAGNOSIS — M5136 Other intervertebral disc degeneration, lumbar region: Secondary | ICD-10-CM | POA: Diagnosis not present

## 2021-08-26 DIAGNOSIS — M9903 Segmental and somatic dysfunction of lumbar region: Secondary | ICD-10-CM | POA: Diagnosis not present

## 2021-08-30 DIAGNOSIS — M9905 Segmental and somatic dysfunction of pelvic region: Secondary | ICD-10-CM | POA: Diagnosis not present

## 2021-08-30 DIAGNOSIS — M5136 Other intervertebral disc degeneration, lumbar region: Secondary | ICD-10-CM | POA: Diagnosis not present

## 2021-08-30 DIAGNOSIS — M9904 Segmental and somatic dysfunction of sacral region: Secondary | ICD-10-CM | POA: Diagnosis not present

## 2021-08-30 DIAGNOSIS — M9903 Segmental and somatic dysfunction of lumbar region: Secondary | ICD-10-CM | POA: Diagnosis not present

## 2021-09-06 DIAGNOSIS — J301 Allergic rhinitis due to pollen: Secondary | ICD-10-CM | POA: Diagnosis not present

## 2021-09-06 DIAGNOSIS — J3089 Other allergic rhinitis: Secondary | ICD-10-CM | POA: Diagnosis not present

## 2021-09-13 DIAGNOSIS — M9902 Segmental and somatic dysfunction of thoracic region: Secondary | ICD-10-CM | POA: Diagnosis not present

## 2021-09-13 DIAGNOSIS — M9903 Segmental and somatic dysfunction of lumbar region: Secondary | ICD-10-CM | POA: Diagnosis not present

## 2021-09-13 DIAGNOSIS — M5134 Other intervertebral disc degeneration, thoracic region: Secondary | ICD-10-CM | POA: Diagnosis not present

## 2021-09-13 DIAGNOSIS — M5136 Other intervertebral disc degeneration, lumbar region: Secondary | ICD-10-CM | POA: Diagnosis not present

## 2021-09-14 DIAGNOSIS — M9902 Segmental and somatic dysfunction of thoracic region: Secondary | ICD-10-CM | POA: Diagnosis not present

## 2021-09-14 DIAGNOSIS — M5134 Other intervertebral disc degeneration, thoracic region: Secondary | ICD-10-CM | POA: Diagnosis not present

## 2021-09-14 DIAGNOSIS — M5136 Other intervertebral disc degeneration, lumbar region: Secondary | ICD-10-CM | POA: Diagnosis not present

## 2021-09-14 DIAGNOSIS — M9903 Segmental and somatic dysfunction of lumbar region: Secondary | ICD-10-CM | POA: Diagnosis not present

## 2021-09-15 DIAGNOSIS — J3089 Other allergic rhinitis: Secondary | ICD-10-CM | POA: Diagnosis not present

## 2021-09-15 DIAGNOSIS — J301 Allergic rhinitis due to pollen: Secondary | ICD-10-CM | POA: Diagnosis not present

## 2021-09-20 DIAGNOSIS — M9902 Segmental and somatic dysfunction of thoracic region: Secondary | ICD-10-CM | POA: Diagnosis not present

## 2021-09-20 DIAGNOSIS — M5136 Other intervertebral disc degeneration, lumbar region: Secondary | ICD-10-CM | POA: Diagnosis not present

## 2021-09-20 DIAGNOSIS — M5134 Other intervertebral disc degeneration, thoracic region: Secondary | ICD-10-CM | POA: Diagnosis not present

## 2021-09-20 DIAGNOSIS — M9903 Segmental and somatic dysfunction of lumbar region: Secondary | ICD-10-CM | POA: Diagnosis not present

## 2021-09-21 DIAGNOSIS — M5134 Other intervertebral disc degeneration, thoracic region: Secondary | ICD-10-CM | POA: Diagnosis not present

## 2021-09-21 DIAGNOSIS — M5136 Other intervertebral disc degeneration, lumbar region: Secondary | ICD-10-CM | POA: Diagnosis not present

## 2021-09-21 DIAGNOSIS — M9903 Segmental and somatic dysfunction of lumbar region: Secondary | ICD-10-CM | POA: Diagnosis not present

## 2021-09-21 DIAGNOSIS — M9902 Segmental and somatic dysfunction of thoracic region: Secondary | ICD-10-CM | POA: Diagnosis not present

## 2021-09-21 DIAGNOSIS — M546 Pain in thoracic spine: Secondary | ICD-10-CM | POA: Diagnosis not present

## 2021-09-22 DIAGNOSIS — J3089 Other allergic rhinitis: Secondary | ICD-10-CM | POA: Diagnosis not present

## 2021-09-22 DIAGNOSIS — J301 Allergic rhinitis due to pollen: Secondary | ICD-10-CM | POA: Diagnosis not present

## 2021-09-24 DIAGNOSIS — J301 Allergic rhinitis due to pollen: Secondary | ICD-10-CM | POA: Diagnosis not present

## 2021-09-27 DIAGNOSIS — J301 Allergic rhinitis due to pollen: Secondary | ICD-10-CM | POA: Diagnosis not present

## 2021-09-27 DIAGNOSIS — M9903 Segmental and somatic dysfunction of lumbar region: Secondary | ICD-10-CM | POA: Diagnosis not present

## 2021-09-27 DIAGNOSIS — M5134 Other intervertebral disc degeneration, thoracic region: Secondary | ICD-10-CM | POA: Diagnosis not present

## 2021-09-27 DIAGNOSIS — M5136 Other intervertebral disc degeneration, lumbar region: Secondary | ICD-10-CM | POA: Diagnosis not present

## 2021-09-27 DIAGNOSIS — M9902 Segmental and somatic dysfunction of thoracic region: Secondary | ICD-10-CM | POA: Diagnosis not present

## 2021-09-27 DIAGNOSIS — J3089 Other allergic rhinitis: Secondary | ICD-10-CM | POA: Diagnosis not present

## 2021-09-28 DIAGNOSIS — M5134 Other intervertebral disc degeneration, thoracic region: Secondary | ICD-10-CM | POA: Diagnosis not present

## 2021-09-28 DIAGNOSIS — M9902 Segmental and somatic dysfunction of thoracic region: Secondary | ICD-10-CM | POA: Diagnosis not present

## 2021-09-28 DIAGNOSIS — M5136 Other intervertebral disc degeneration, lumbar region: Secondary | ICD-10-CM | POA: Diagnosis not present

## 2021-09-28 DIAGNOSIS — M9903 Segmental and somatic dysfunction of lumbar region: Secondary | ICD-10-CM | POA: Diagnosis not present

## 2021-09-29 DIAGNOSIS — M5136 Other intervertebral disc degeneration, lumbar region: Secondary | ICD-10-CM | POA: Diagnosis not present

## 2021-09-29 DIAGNOSIS — M9902 Segmental and somatic dysfunction of thoracic region: Secondary | ICD-10-CM | POA: Diagnosis not present

## 2021-09-29 DIAGNOSIS — M5134 Other intervertebral disc degeneration, thoracic region: Secondary | ICD-10-CM | POA: Diagnosis not present

## 2021-09-29 DIAGNOSIS — M9903 Segmental and somatic dysfunction of lumbar region: Secondary | ICD-10-CM | POA: Diagnosis not present

## 2021-10-04 DIAGNOSIS — J301 Allergic rhinitis due to pollen: Secondary | ICD-10-CM | POA: Diagnosis not present

## 2021-10-04 DIAGNOSIS — M5136 Other intervertebral disc degeneration, lumbar region: Secondary | ICD-10-CM | POA: Diagnosis not present

## 2021-10-04 DIAGNOSIS — J3089 Other allergic rhinitis: Secondary | ICD-10-CM | POA: Diagnosis not present

## 2021-10-04 DIAGNOSIS — M9903 Segmental and somatic dysfunction of lumbar region: Secondary | ICD-10-CM | POA: Diagnosis not present

## 2021-10-04 DIAGNOSIS — M5134 Other intervertebral disc degeneration, thoracic region: Secondary | ICD-10-CM | POA: Diagnosis not present

## 2021-10-04 DIAGNOSIS — M9902 Segmental and somatic dysfunction of thoracic region: Secondary | ICD-10-CM | POA: Diagnosis not present

## 2021-10-06 DIAGNOSIS — N952 Postmenopausal atrophic vaginitis: Secondary | ICD-10-CM | POA: Diagnosis not present

## 2021-10-06 DIAGNOSIS — Z01419 Encounter for gynecological examination (general) (routine) without abnormal findings: Secondary | ICD-10-CM | POA: Diagnosis not present

## 2021-10-06 DIAGNOSIS — L900 Lichen sclerosus et atrophicus: Secondary | ICD-10-CM | POA: Diagnosis not present

## 2021-10-06 DIAGNOSIS — Z6827 Body mass index (BMI) 27.0-27.9, adult: Secondary | ICD-10-CM | POA: Diagnosis not present

## 2021-10-07 DIAGNOSIS — M5136 Other intervertebral disc degeneration, lumbar region: Secondary | ICD-10-CM | POA: Diagnosis not present

## 2021-10-07 DIAGNOSIS — M5134 Other intervertebral disc degeneration, thoracic region: Secondary | ICD-10-CM | POA: Diagnosis not present

## 2021-10-07 DIAGNOSIS — M9903 Segmental and somatic dysfunction of lumbar region: Secondary | ICD-10-CM | POA: Diagnosis not present

## 2021-10-07 DIAGNOSIS — M9902 Segmental and somatic dysfunction of thoracic region: Secondary | ICD-10-CM | POA: Diagnosis not present

## 2021-10-11 DIAGNOSIS — J301 Allergic rhinitis due to pollen: Secondary | ICD-10-CM | POA: Diagnosis not present

## 2021-10-11 DIAGNOSIS — J3089 Other allergic rhinitis: Secondary | ICD-10-CM | POA: Diagnosis not present

## 2021-10-11 DIAGNOSIS — M5134 Other intervertebral disc degeneration, thoracic region: Secondary | ICD-10-CM | POA: Diagnosis not present

## 2021-10-11 DIAGNOSIS — M9902 Segmental and somatic dysfunction of thoracic region: Secondary | ICD-10-CM | POA: Diagnosis not present

## 2021-10-11 DIAGNOSIS — M5136 Other intervertebral disc degeneration, lumbar region: Secondary | ICD-10-CM | POA: Diagnosis not present

## 2021-10-11 DIAGNOSIS — M9903 Segmental and somatic dysfunction of lumbar region: Secondary | ICD-10-CM | POA: Diagnosis not present

## 2021-10-13 DIAGNOSIS — E559 Vitamin D deficiency, unspecified: Secondary | ICD-10-CM | POA: Diagnosis not present

## 2021-10-13 DIAGNOSIS — I10 Essential (primary) hypertension: Secondary | ICD-10-CM | POA: Diagnosis not present

## 2021-10-13 DIAGNOSIS — E7800 Pure hypercholesterolemia, unspecified: Secondary | ICD-10-CM | POA: Diagnosis not present

## 2021-10-14 DIAGNOSIS — M5136 Other intervertebral disc degeneration, lumbar region: Secondary | ICD-10-CM | POA: Diagnosis not present

## 2021-10-14 DIAGNOSIS — M5134 Other intervertebral disc degeneration, thoracic region: Secondary | ICD-10-CM | POA: Diagnosis not present

## 2021-10-14 DIAGNOSIS — M9903 Segmental and somatic dysfunction of lumbar region: Secondary | ICD-10-CM | POA: Diagnosis not present

## 2021-10-14 DIAGNOSIS — M9902 Segmental and somatic dysfunction of thoracic region: Secondary | ICD-10-CM | POA: Diagnosis not present

## 2021-10-19 DIAGNOSIS — M5134 Other intervertebral disc degeneration, thoracic region: Secondary | ICD-10-CM | POA: Diagnosis not present

## 2021-10-19 DIAGNOSIS — J3089 Other allergic rhinitis: Secondary | ICD-10-CM | POA: Diagnosis not present

## 2021-10-19 DIAGNOSIS — M9903 Segmental and somatic dysfunction of lumbar region: Secondary | ICD-10-CM | POA: Diagnosis not present

## 2021-10-19 DIAGNOSIS — J301 Allergic rhinitis due to pollen: Secondary | ICD-10-CM | POA: Diagnosis not present

## 2021-10-19 DIAGNOSIS — M9902 Segmental and somatic dysfunction of thoracic region: Secondary | ICD-10-CM | POA: Diagnosis not present

## 2021-10-19 DIAGNOSIS — M5136 Other intervertebral disc degeneration, lumbar region: Secondary | ICD-10-CM | POA: Diagnosis not present

## 2021-10-21 DIAGNOSIS — Z0000 Encounter for general adult medical examination without abnormal findings: Secondary | ICD-10-CM | POA: Diagnosis not present

## 2021-10-21 DIAGNOSIS — E7800 Pure hypercholesterolemia, unspecified: Secondary | ICD-10-CM | POA: Diagnosis not present

## 2021-10-21 DIAGNOSIS — E559 Vitamin D deficiency, unspecified: Secondary | ICD-10-CM | POA: Diagnosis not present

## 2021-10-21 DIAGNOSIS — K5900 Constipation, unspecified: Secondary | ICD-10-CM | POA: Diagnosis not present

## 2021-10-21 DIAGNOSIS — Z131 Encounter for screening for diabetes mellitus: Secondary | ICD-10-CM | POA: Diagnosis not present

## 2021-10-21 DIAGNOSIS — R030 Elevated blood-pressure reading, without diagnosis of hypertension: Secondary | ICD-10-CM | POA: Diagnosis not present

## 2021-10-21 DIAGNOSIS — R233 Spontaneous ecchymoses: Secondary | ICD-10-CM | POA: Diagnosis not present

## 2021-10-26 DIAGNOSIS — J301 Allergic rhinitis due to pollen: Secondary | ICD-10-CM | POA: Diagnosis not present

## 2021-10-26 DIAGNOSIS — J3089 Other allergic rhinitis: Secondary | ICD-10-CM | POA: Diagnosis not present

## 2021-10-27 DIAGNOSIS — M9902 Segmental and somatic dysfunction of thoracic region: Secondary | ICD-10-CM | POA: Diagnosis not present

## 2021-10-27 DIAGNOSIS — M5134 Other intervertebral disc degeneration, thoracic region: Secondary | ICD-10-CM | POA: Diagnosis not present

## 2021-10-27 DIAGNOSIS — M5136 Other intervertebral disc degeneration, lumbar region: Secondary | ICD-10-CM | POA: Diagnosis not present

## 2021-10-27 DIAGNOSIS — M9903 Segmental and somatic dysfunction of lumbar region: Secondary | ICD-10-CM | POA: Diagnosis not present

## 2021-11-02 DIAGNOSIS — J3089 Other allergic rhinitis: Secondary | ICD-10-CM | POA: Diagnosis not present

## 2021-11-02 DIAGNOSIS — J301 Allergic rhinitis due to pollen: Secondary | ICD-10-CM | POA: Diagnosis not present

## 2021-11-09 DIAGNOSIS — J3089 Other allergic rhinitis: Secondary | ICD-10-CM | POA: Diagnosis not present

## 2021-11-09 DIAGNOSIS — J301 Allergic rhinitis due to pollen: Secondary | ICD-10-CM | POA: Diagnosis not present

## 2021-11-15 DIAGNOSIS — J301 Allergic rhinitis due to pollen: Secondary | ICD-10-CM | POA: Diagnosis not present

## 2021-11-15 DIAGNOSIS — J3089 Other allergic rhinitis: Secondary | ICD-10-CM | POA: Diagnosis not present

## 2021-11-16 DIAGNOSIS — N762 Acute vulvitis: Secondary | ICD-10-CM | POA: Diagnosis not present

## 2021-11-16 DIAGNOSIS — N766 Ulceration of vulva: Secondary | ICD-10-CM | POA: Diagnosis not present

## 2021-11-22 DIAGNOSIS — J3089 Other allergic rhinitis: Secondary | ICD-10-CM | POA: Diagnosis not present

## 2021-11-22 DIAGNOSIS — J301 Allergic rhinitis due to pollen: Secondary | ICD-10-CM | POA: Diagnosis not present

## 2021-11-24 DIAGNOSIS — M5134 Other intervertebral disc degeneration, thoracic region: Secondary | ICD-10-CM | POA: Diagnosis not present

## 2021-11-24 DIAGNOSIS — M5136 Other intervertebral disc degeneration, lumbar region: Secondary | ICD-10-CM | POA: Diagnosis not present

## 2021-11-24 DIAGNOSIS — M9902 Segmental and somatic dysfunction of thoracic region: Secondary | ICD-10-CM | POA: Diagnosis not present

## 2021-11-24 DIAGNOSIS — M9903 Segmental and somatic dysfunction of lumbar region: Secondary | ICD-10-CM | POA: Diagnosis not present

## 2021-11-29 DIAGNOSIS — J3089 Other allergic rhinitis: Secondary | ICD-10-CM | POA: Diagnosis not present

## 2021-11-29 DIAGNOSIS — J301 Allergic rhinitis due to pollen: Secondary | ICD-10-CM | POA: Diagnosis not present

## 2021-11-29 NOTE — Assessment & Plan Note (Signed)
Not interfering with sleep ccurrently

## 2021-11-30 DIAGNOSIS — D1801 Hemangioma of skin and subcutaneous tissue: Secondary | ICD-10-CM | POA: Diagnosis not present

## 2021-11-30 DIAGNOSIS — L821 Other seborrheic keratosis: Secondary | ICD-10-CM | POA: Diagnosis not present

## 2021-11-30 DIAGNOSIS — Z85820 Personal history of malignant melanoma of skin: Secondary | ICD-10-CM | POA: Diagnosis not present

## 2021-11-30 DIAGNOSIS — L814 Other melanin hyperpigmentation: Secondary | ICD-10-CM | POA: Diagnosis not present

## 2021-12-01 DIAGNOSIS — M5136 Other intervertebral disc degeneration, lumbar region: Secondary | ICD-10-CM | POA: Diagnosis not present

## 2021-12-01 DIAGNOSIS — M9903 Segmental and somatic dysfunction of lumbar region: Secondary | ICD-10-CM | POA: Diagnosis not present

## 2021-12-01 DIAGNOSIS — M9902 Segmental and somatic dysfunction of thoracic region: Secondary | ICD-10-CM | POA: Diagnosis not present

## 2021-12-01 DIAGNOSIS — M5134 Other intervertebral disc degeneration, thoracic region: Secondary | ICD-10-CM | POA: Diagnosis not present

## 2021-12-02 DIAGNOSIS — J3089 Other allergic rhinitis: Secondary | ICD-10-CM | POA: Diagnosis not present

## 2021-12-06 ENCOUNTER — Ambulatory Visit: Payer: Medicare HMO

## 2021-12-06 ENCOUNTER — Other Ambulatory Visit: Payer: Self-pay

## 2021-12-06 ENCOUNTER — Ambulatory Visit
Admission: RE | Admit: 2021-12-06 | Discharge: 2021-12-06 | Disposition: A | Discharge status: Home / Self Care | Payer: Medicare HMO | Source: Ambulatory Visit | Attending: Obstetrics and Gynecology | Admitting: Obstetrics and Gynecology

## 2021-12-06 DIAGNOSIS — Z1231 Encounter for screening mammogram for malignant neoplasm of breast: Secondary | ICD-10-CM

## 2021-12-07 DIAGNOSIS — J301 Allergic rhinitis due to pollen: Secondary | ICD-10-CM | POA: Diagnosis not present

## 2021-12-07 DIAGNOSIS — J3089 Other allergic rhinitis: Secondary | ICD-10-CM | POA: Diagnosis not present

## 2021-12-08 DIAGNOSIS — M5136 Other intervertebral disc degeneration, lumbar region: Secondary | ICD-10-CM | POA: Diagnosis not present

## 2021-12-08 DIAGNOSIS — M5134 Other intervertebral disc degeneration, thoracic region: Secondary | ICD-10-CM | POA: Diagnosis not present

## 2021-12-08 DIAGNOSIS — M9902 Segmental and somatic dysfunction of thoracic region: Secondary | ICD-10-CM | POA: Diagnosis not present

## 2021-12-08 DIAGNOSIS — M9903 Segmental and somatic dysfunction of lumbar region: Secondary | ICD-10-CM | POA: Diagnosis not present

## 2021-12-14 DIAGNOSIS — J301 Allergic rhinitis due to pollen: Secondary | ICD-10-CM | POA: Diagnosis not present

## 2021-12-14 DIAGNOSIS — J3089 Other allergic rhinitis: Secondary | ICD-10-CM | POA: Diagnosis not present

## 2021-12-15 DIAGNOSIS — M9903 Segmental and somatic dysfunction of lumbar region: Secondary | ICD-10-CM | POA: Diagnosis not present

## 2021-12-15 DIAGNOSIS — M9902 Segmental and somatic dysfunction of thoracic region: Secondary | ICD-10-CM | POA: Diagnosis not present

## 2021-12-15 DIAGNOSIS — M5134 Other intervertebral disc degeneration, thoracic region: Secondary | ICD-10-CM | POA: Diagnosis not present

## 2021-12-15 DIAGNOSIS — M5136 Other intervertebral disc degeneration, lumbar region: Secondary | ICD-10-CM | POA: Diagnosis not present

## 2021-12-21 DIAGNOSIS — J3089 Other allergic rhinitis: Secondary | ICD-10-CM | POA: Diagnosis not present

## 2021-12-21 DIAGNOSIS — J301 Allergic rhinitis due to pollen: Secondary | ICD-10-CM | POA: Diagnosis not present

## 2021-12-22 DIAGNOSIS — M5414 Radiculopathy, thoracic region: Secondary | ICD-10-CM | POA: Diagnosis not present

## 2021-12-23 ENCOUNTER — Other Ambulatory Visit: Payer: Self-pay | Admitting: Neurosurgery

## 2021-12-23 DIAGNOSIS — M5414 Radiculopathy, thoracic region: Secondary | ICD-10-CM

## 2021-12-23 DIAGNOSIS — M5136 Other intervertebral disc degeneration, lumbar region: Secondary | ICD-10-CM | POA: Diagnosis not present

## 2021-12-23 DIAGNOSIS — M9903 Segmental and somatic dysfunction of lumbar region: Secondary | ICD-10-CM | POA: Diagnosis not present

## 2021-12-23 DIAGNOSIS — M9902 Segmental and somatic dysfunction of thoracic region: Secondary | ICD-10-CM | POA: Diagnosis not present

## 2021-12-23 DIAGNOSIS — M5134 Other intervertebral disc degeneration, thoracic region: Secondary | ICD-10-CM | POA: Diagnosis not present

## 2021-12-27 DIAGNOSIS — J301 Allergic rhinitis due to pollen: Secondary | ICD-10-CM | POA: Diagnosis not present

## 2021-12-27 DIAGNOSIS — J3089 Other allergic rhinitis: Secondary | ICD-10-CM | POA: Diagnosis not present

## 2021-12-28 DIAGNOSIS — M5136 Other intervertebral disc degeneration, lumbar region: Secondary | ICD-10-CM | POA: Diagnosis not present

## 2021-12-28 DIAGNOSIS — M9903 Segmental and somatic dysfunction of lumbar region: Secondary | ICD-10-CM | POA: Diagnosis not present

## 2021-12-28 DIAGNOSIS — M9902 Segmental and somatic dysfunction of thoracic region: Secondary | ICD-10-CM | POA: Diagnosis not present

## 2021-12-28 DIAGNOSIS — M5134 Other intervertebral disc degeneration, thoracic region: Secondary | ICD-10-CM | POA: Diagnosis not present

## 2021-12-30 DIAGNOSIS — M5134 Other intervertebral disc degeneration, thoracic region: Secondary | ICD-10-CM | POA: Diagnosis not present

## 2021-12-30 DIAGNOSIS — M5136 Other intervertebral disc degeneration, lumbar region: Secondary | ICD-10-CM | POA: Diagnosis not present

## 2021-12-30 DIAGNOSIS — M9903 Segmental and somatic dysfunction of lumbar region: Secondary | ICD-10-CM | POA: Diagnosis not present

## 2021-12-30 DIAGNOSIS — M9902 Segmental and somatic dysfunction of thoracic region: Secondary | ICD-10-CM | POA: Diagnosis not present

## 2022-01-03 DIAGNOSIS — J301 Allergic rhinitis due to pollen: Secondary | ICD-10-CM | POA: Diagnosis not present

## 2022-01-03 DIAGNOSIS — J3089 Other allergic rhinitis: Secondary | ICD-10-CM | POA: Diagnosis not present

## 2022-01-05 DIAGNOSIS — M9903 Segmental and somatic dysfunction of lumbar region: Secondary | ICD-10-CM | POA: Diagnosis not present

## 2022-01-05 DIAGNOSIS — M5134 Other intervertebral disc degeneration, thoracic region: Secondary | ICD-10-CM | POA: Diagnosis not present

## 2022-01-05 DIAGNOSIS — M9902 Segmental and somatic dysfunction of thoracic region: Secondary | ICD-10-CM | POA: Diagnosis not present

## 2022-01-05 DIAGNOSIS — M5136 Other intervertebral disc degeneration, lumbar region: Secondary | ICD-10-CM | POA: Diagnosis not present

## 2022-01-10 DIAGNOSIS — J3089 Other allergic rhinitis: Secondary | ICD-10-CM | POA: Diagnosis not present

## 2022-01-10 DIAGNOSIS — J301 Allergic rhinitis due to pollen: Secondary | ICD-10-CM | POA: Diagnosis not present

## 2022-01-12 DIAGNOSIS — Z008 Encounter for other general examination: Secondary | ICD-10-CM | POA: Diagnosis not present

## 2022-01-12 DIAGNOSIS — J45909 Unspecified asthma, uncomplicated: Secondary | ICD-10-CM | POA: Diagnosis not present

## 2022-01-12 DIAGNOSIS — E785 Hyperlipidemia, unspecified: Secondary | ICD-10-CM | POA: Diagnosis not present

## 2022-01-12 DIAGNOSIS — Z7722 Contact with and (suspected) exposure to environmental tobacco smoke (acute) (chronic): Secondary | ICD-10-CM | POA: Diagnosis not present

## 2022-01-12 DIAGNOSIS — R030 Elevated blood-pressure reading, without diagnosis of hypertension: Secondary | ICD-10-CM | POA: Diagnosis not present

## 2022-01-12 DIAGNOSIS — R32 Unspecified urinary incontinence: Secondary | ICD-10-CM | POA: Diagnosis not present

## 2022-01-12 DIAGNOSIS — Z882 Allergy status to sulfonamides status: Secondary | ICD-10-CM | POA: Diagnosis not present

## 2022-01-12 DIAGNOSIS — H04129 Dry eye syndrome of unspecified lacrimal gland: Secondary | ICD-10-CM | POA: Diagnosis not present

## 2022-01-12 DIAGNOSIS — M1990 Unspecified osteoarthritis, unspecified site: Secondary | ICD-10-CM | POA: Diagnosis not present

## 2022-01-12 DIAGNOSIS — G4700 Insomnia, unspecified: Secondary | ICD-10-CM | POA: Diagnosis not present

## 2022-01-17 DIAGNOSIS — J3089 Other allergic rhinitis: Secondary | ICD-10-CM | POA: Diagnosis not present

## 2022-01-17 DIAGNOSIS — J301 Allergic rhinitis due to pollen: Secondary | ICD-10-CM | POA: Diagnosis not present

## 2022-01-18 ENCOUNTER — Ambulatory Visit
Admission: RE | Admit: 2022-01-18 | Discharge: 2022-01-18 | Disposition: A | Discharge status: Home / Self Care | Payer: Medicare HMO | Source: Ambulatory Visit | Attending: Neurosurgery | Admitting: Neurosurgery

## 2022-01-18 DIAGNOSIS — M549 Dorsalgia, unspecified: Secondary | ICD-10-CM | POA: Diagnosis not present

## 2022-01-18 DIAGNOSIS — M5414 Radiculopathy, thoracic region: Secondary | ICD-10-CM

## 2022-01-24 DIAGNOSIS — J988 Other specified respiratory disorders: Secondary | ICD-10-CM | POA: Diagnosis not present

## 2022-01-24 DIAGNOSIS — R051 Acute cough: Secondary | ICD-10-CM | POA: Diagnosis not present

## 2022-01-26 DIAGNOSIS — R059 Cough, unspecified: Secondary | ICD-10-CM | POA: Diagnosis not present

## 2022-01-26 DIAGNOSIS — R61 Generalized hyperhidrosis: Secondary | ICD-10-CM | POA: Diagnosis not present

## 2022-02-03 DIAGNOSIS — S22060A Wedge compression fracture of T7-T8 vertebra, initial encounter for closed fracture: Secondary | ICD-10-CM | POA: Diagnosis not present

## 2022-02-03 DIAGNOSIS — J3089 Other allergic rhinitis: Secondary | ICD-10-CM | POA: Diagnosis not present

## 2022-02-03 DIAGNOSIS — M5414 Radiculopathy, thoracic region: Secondary | ICD-10-CM | POA: Diagnosis not present

## 2022-02-03 DIAGNOSIS — J301 Allergic rhinitis due to pollen: Secondary | ICD-10-CM | POA: Diagnosis not present

## 2022-02-09 DIAGNOSIS — J3089 Other allergic rhinitis: Secondary | ICD-10-CM | POA: Diagnosis not present

## 2022-02-09 DIAGNOSIS — J301 Allergic rhinitis due to pollen: Secondary | ICD-10-CM | POA: Diagnosis not present

## 2022-02-12 DIAGNOSIS — J301 Allergic rhinitis due to pollen: Secondary | ICD-10-CM | POA: Diagnosis not present

## 2022-02-16 DIAGNOSIS — J301 Allergic rhinitis due to pollen: Secondary | ICD-10-CM | POA: Diagnosis not present

## 2022-02-16 DIAGNOSIS — J3089 Other allergic rhinitis: Secondary | ICD-10-CM | POA: Diagnosis not present

## 2022-02-23 DIAGNOSIS — J3089 Other allergic rhinitis: Secondary | ICD-10-CM | POA: Diagnosis not present

## 2022-02-23 DIAGNOSIS — J301 Allergic rhinitis due to pollen: Secondary | ICD-10-CM | POA: Diagnosis not present

## 2022-02-25 ENCOUNTER — Other Ambulatory Visit: Payer: Self-pay | Admitting: Family Medicine

## 2022-02-25 DIAGNOSIS — E2839 Other primary ovarian failure: Secondary | ICD-10-CM

## 2022-03-02 DIAGNOSIS — J3089 Other allergic rhinitis: Secondary | ICD-10-CM | POA: Diagnosis not present

## 2022-03-02 DIAGNOSIS — J301 Allergic rhinitis due to pollen: Secondary | ICD-10-CM | POA: Diagnosis not present

## 2022-03-03 DIAGNOSIS — D3131 Benign neoplasm of right choroid: Secondary | ICD-10-CM | POA: Diagnosis not present

## 2022-03-03 DIAGNOSIS — H2513 Age-related nuclear cataract, bilateral: Secondary | ICD-10-CM | POA: Diagnosis not present

## 2022-03-03 DIAGNOSIS — H52203 Unspecified astigmatism, bilateral: Secondary | ICD-10-CM | POA: Diagnosis not present

## 2022-03-07 DIAGNOSIS — S22060A Wedge compression fracture of T7-T8 vertebra, initial encounter for closed fracture: Secondary | ICD-10-CM | POA: Diagnosis not present

## 2022-03-07 DIAGNOSIS — M8088XA Other osteoporosis with current pathological fracture, vertebra(e), initial encounter for fracture: Secondary | ICD-10-CM | POA: Diagnosis not present

## 2022-03-07 DIAGNOSIS — M8008XA Age-related osteoporosis with current pathological fracture, vertebra(e), initial encounter for fracture: Secondary | ICD-10-CM | POA: Diagnosis not present

## 2022-03-14 IMAGING — MR MR BREAST BILAT WO/W CM
8 of 12 series · 34 of 48 positions shown · IV contrast (8 ML GADAVIST)
Comparison: Previous exam(s).  No previous breast MRIs.

CLINICAL DATA: Patient with history of palpable abnormality over
the outer midportion of the right breast with associated tenderness.
Diagnostic mammogram and ultrasound 12/04/2020 negative in this
area. History also states patient BRCA 1 positive.

LABS:  None.
EXAM:
BILATERAL BREAST MRI WITH AND WITHOUT CONTRAST
TECHNIQUE: Multiplanar, multisequence MR images of both breasts were obtained
prior to and following the intravenous administration of 8 ml of
Gadavist.

[Series 2: t2_tirm_tra ipat (a-p) · axial · 1.2mm · 0.89mm/px · z∈[-101,+71]mm · 5 of 144 slices shown]
[im 1/144]
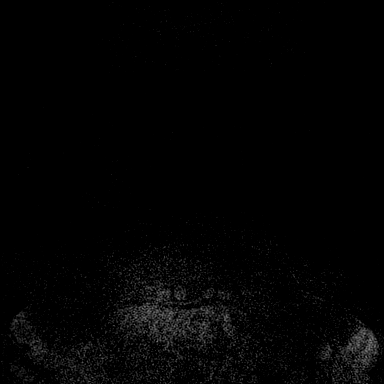
[im 36/144]
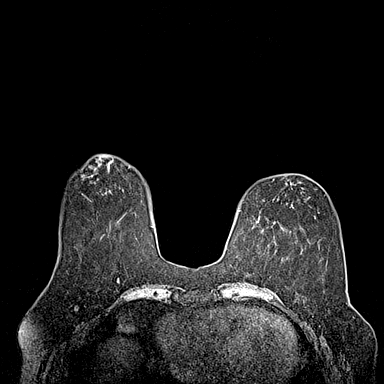
[im 72/144]
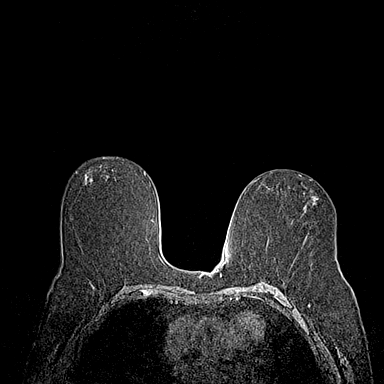
[im 108/144]
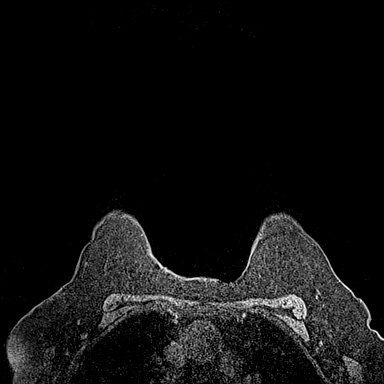
[im 144/144]
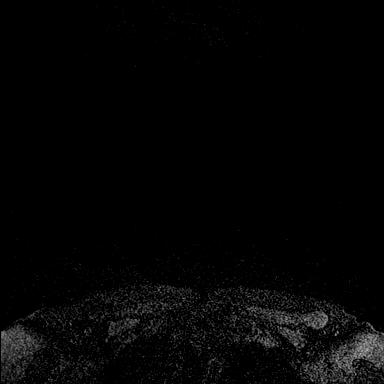

[Series 3: fl3d pre-cm no · axial · non-contrast · 1.2mm · 0.89mm/px · z∈[-101,+71]mm · 5 of 144 slices shown]
[im 1/144]
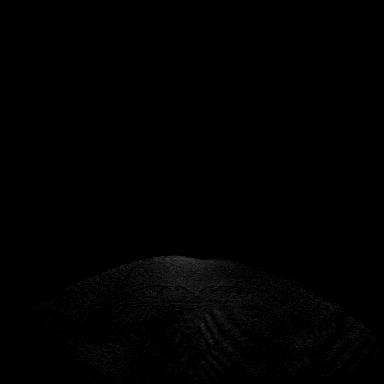
[im 36/144]
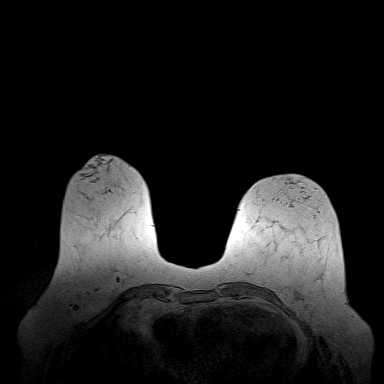
[im 72/144]
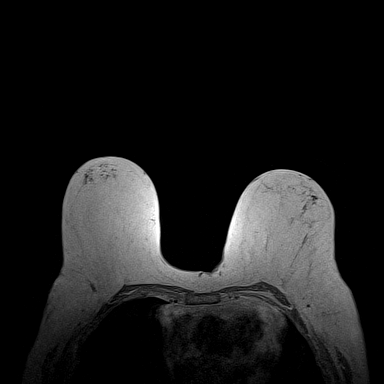
[im 108/144]
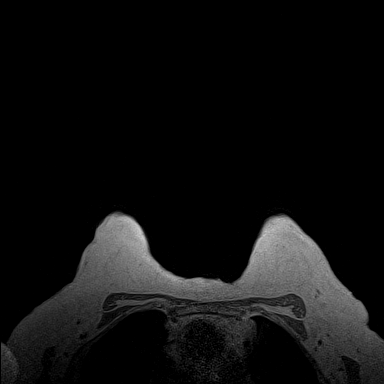
[im 144/144]
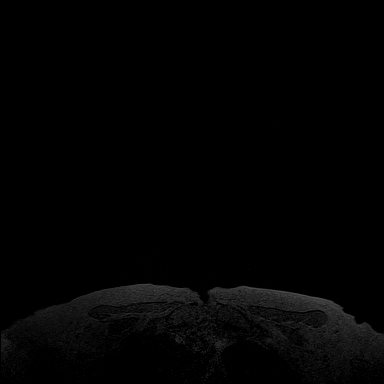

[Series 4: fl3d pre-cm · axial · non-contrast · 1.2mm · 0.89mm/px · z∈[-101,+71]mm · 5 of 144 slices shown]
[im 1/144]
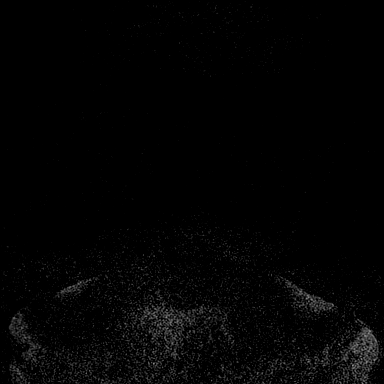
[im 36/144]
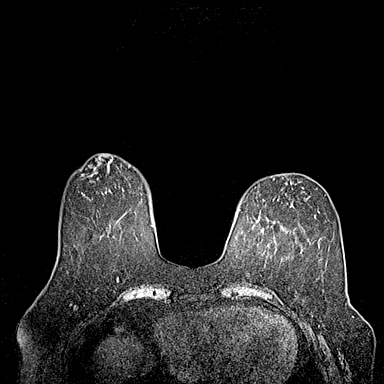
[im 72/144]
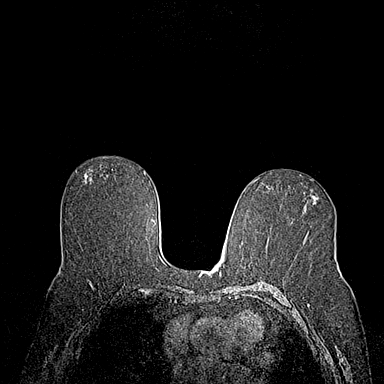
[im 108/144]
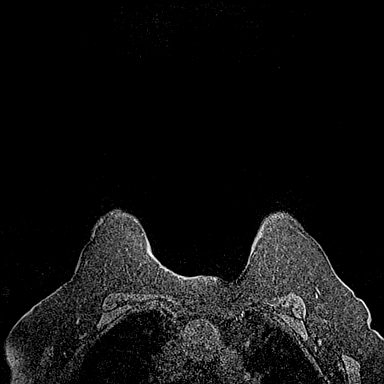
[im 144/144]
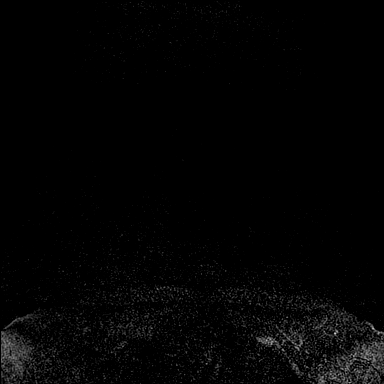

[Series 5: fl3d post-cm 20 · axial · 1.2mm · 0.89mm/px · z∈[-101,+71]mm · 5 of 144 slices shown (1 of 3)]
[im 1/144]
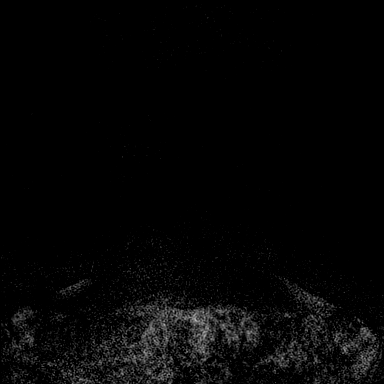
[im 36/144]
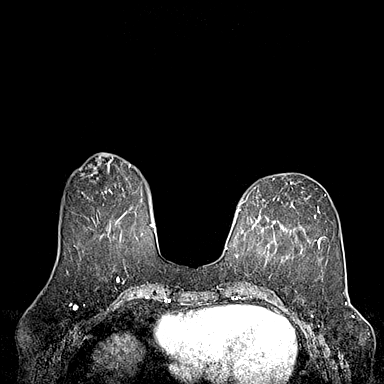
[im 72/144]
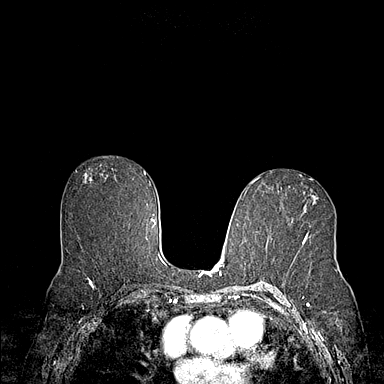
[im 108/144]
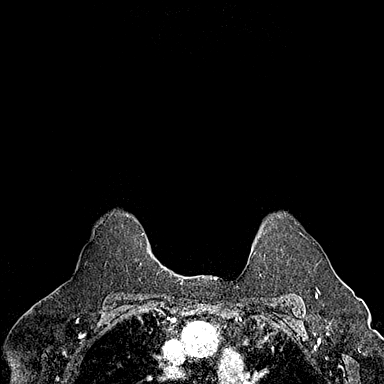
[im 144/144]
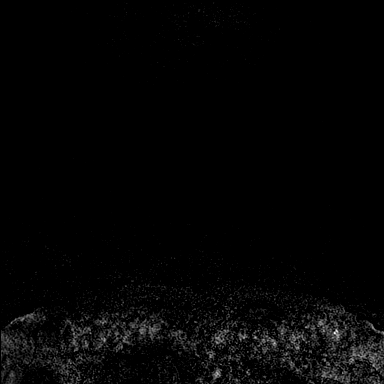

[Series 6: fl3d post-cm 20 · axial · 1.2mm · 0.89mm/px · z∈[-101,+71]mm · 5 of 144 slices shown (2 of 3)]
[im 1/144]
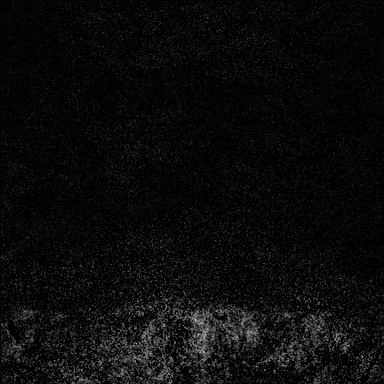
[im 36/144]
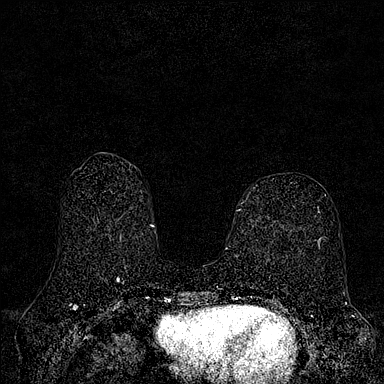
[im 72/144]
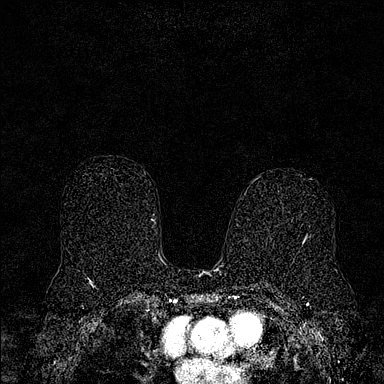
[im 108/144]
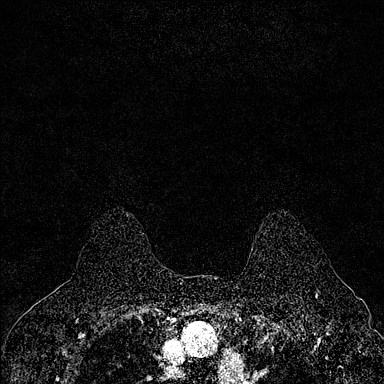
[im 144/144]
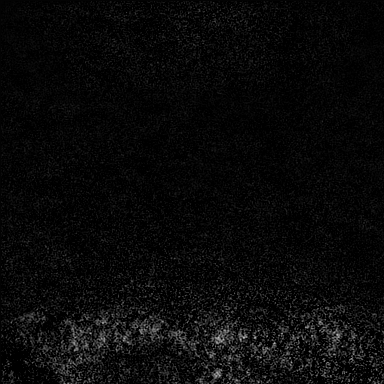

[Series 7: fl3d post-cm 20 · axial · 172.8mm · 0.89mm/px · 1 of 1 slices shown (3 of 3)]
[im 1/1]
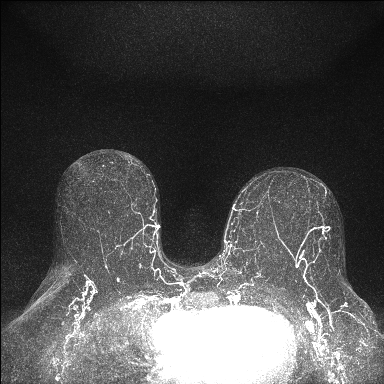

[Series 8: fl3d post-cm 3 · axial · 1.2mm · 0.89mm/px · z∈[-101,+71]mm · 5 of 144 slices shown (1 of 2)]
[im 1/144]
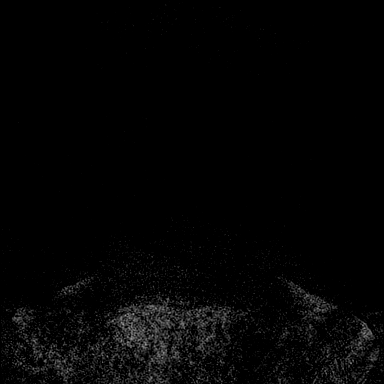
[im 36/144]
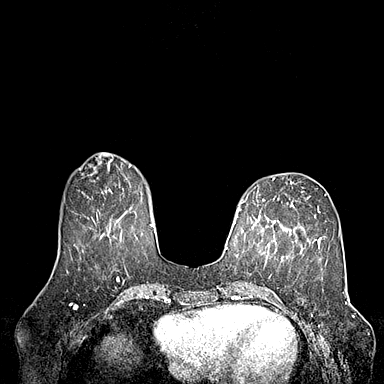
[im 72/144]
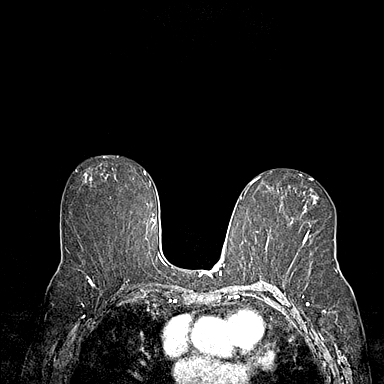
[im 108/144]
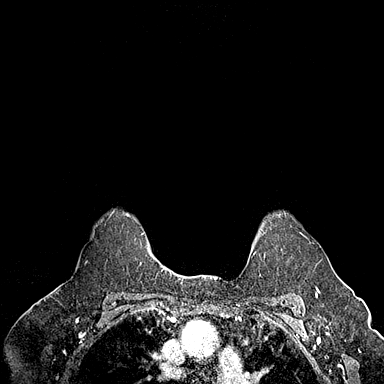
[im 144/144]
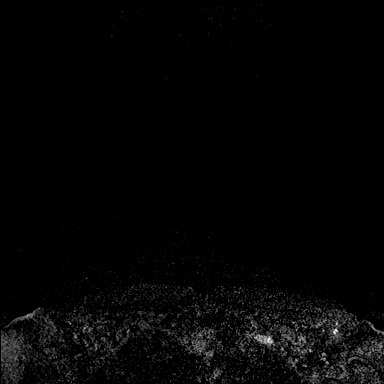

[Series 9: fl3d post-cm 3 · axial · 1.2mm · 0.89mm/px · z∈[-101,-15]mm · 3 of 144 slices shown (2 of 2)]
[im 1/144]
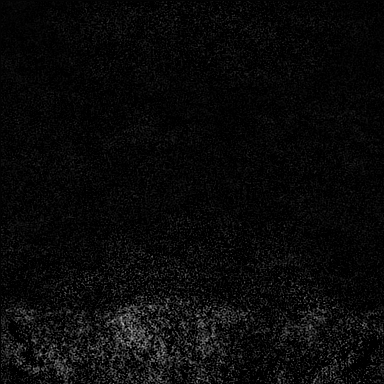
[im 36/144]
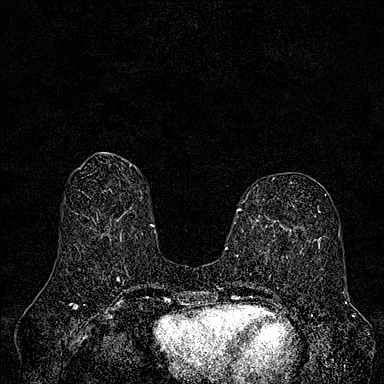
[im 72/144]
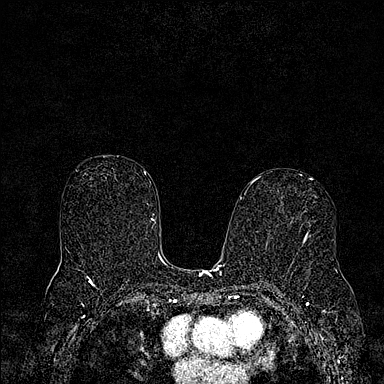

[34 of 48 positions shown; findings below may reference images not displayed]

Three-dimensional MR images were rendered by post-processing of the
original MR data on an independent workstation. The
three-dimensional MR images were interpreted, and findings are
reported in the following complete MRI report for this study. Three
dimensional images were evaluated at the independent interpreting
workstation using the DynaCAD thin client.
FINDINGS: Breast composition: b. Scattered fibroglandular tissue.

Background parenchymal enhancement: Mild

Right breast: No mass or abnormal enhancement.

Left breast: No mass or abnormal enhancement.

Lymph nodes: No abnormal appearing lymph nodes.

Ancillary findings:  None.
IMPRESSION: Normal breast MRI without evidence of malignancy.

RECOMMENDATION:
Recommend continued annual bilateral screening mammography.

The American Cancer Society recommends annual MRI and mammography in
patients with an estimated lifetime risk of developing breast cancer
greater than 20 - 25%, or who are known or suspected to be positive
for the breast cancer gene.

BI-RADS CATEGORY  1: Negative.

## 2022-03-16 DIAGNOSIS — J301 Allergic rhinitis due to pollen: Secondary | ICD-10-CM | POA: Diagnosis not present

## 2022-03-16 DIAGNOSIS — J3089 Other allergic rhinitis: Secondary | ICD-10-CM | POA: Diagnosis not present

## 2022-03-17 DIAGNOSIS — S22060A Wedge compression fracture of T7-T8 vertebra, initial encounter for closed fracture: Secondary | ICD-10-CM | POA: Diagnosis not present

## 2022-03-21 DIAGNOSIS — S22000S Wedge compression fracture of unspecified thoracic vertebra, sequela: Secondary | ICD-10-CM | POA: Diagnosis not present

## 2022-03-21 DIAGNOSIS — R252 Cramp and spasm: Secondary | ICD-10-CM | POA: Diagnosis not present

## 2022-03-23 DIAGNOSIS — J301 Allergic rhinitis due to pollen: Secondary | ICD-10-CM | POA: Diagnosis not present

## 2022-03-23 DIAGNOSIS — J3089 Other allergic rhinitis: Secondary | ICD-10-CM | POA: Diagnosis not present

## 2022-03-25 DIAGNOSIS — Z0100 Encounter for examination of eyes and vision without abnormal findings: Secondary | ICD-10-CM | POA: Diagnosis not present

## 2022-03-30 DIAGNOSIS — J301 Allergic rhinitis due to pollen: Secondary | ICD-10-CM | POA: Diagnosis not present

## 2022-03-30 DIAGNOSIS — J3089 Other allergic rhinitis: Secondary | ICD-10-CM | POA: Diagnosis not present

## 2022-03-30 DIAGNOSIS — J3081 Allergic rhinitis due to animal (cat) (dog) hair and dander: Secondary | ICD-10-CM | POA: Diagnosis not present

## 2022-04-06 DIAGNOSIS — J3089 Other allergic rhinitis: Secondary | ICD-10-CM | POA: Diagnosis not present

## 2022-04-06 DIAGNOSIS — J301 Allergic rhinitis due to pollen: Secondary | ICD-10-CM | POA: Diagnosis not present

## 2022-04-06 DIAGNOSIS — J3081 Allergic rhinitis due to animal (cat) (dog) hair and dander: Secondary | ICD-10-CM | POA: Diagnosis not present

## 2022-04-11 ENCOUNTER — Other Ambulatory Visit: Payer: Self-pay | Admitting: Internal Medicine

## 2022-04-11 NOTE — Telephone Encounter (Signed)
Clonazepam refilled 

## 2022-04-11 NOTE — Telephone Encounter (Signed)
Please advise on med refill.  Allergies  Allergen Reactions   Cefzil [Cefprozil]    Erythromycin    Sulfa Antibiotics     Current Outpatient Medications:    albuterol (PROVENTIL HFA;VENTOLIN HFA) 108 (90 Base) MCG/ACT inhaler, Inhale 1-2 puffs into the lungs every 6 (six) hours as needed for wheezing or shortness of breath., Disp: , Rfl:    azelastine (OPTIVAR) 0.05 % ophthalmic solution, INSTILL 1 DROP TO AFFECTED EYE(S) TWICE A DAY, Disp: , Rfl: 2   clonazePAM (KLONOPIN) 0.5 MG tablet, TAKE 1-3 TABLETS BY MOUTH DAILY FOR SLEEP IF NEEDED, Disp: 90 tablet, Rfl: 5   Coenzyme Q10 (COQ10) 200 MG CAPS, Take 1 capsule by mouth daily. (Patient not taking: Reported on 08/19/2021), Disp: , Rfl:    Cyanocobalamin (VITAMIN B-12) 500 MCG SUBL, Place 1 tablet under the tongue daily after lunch., Disp: , Rfl:    EPIPEN 2-PAK 0.3 MG/0.3ML DEVI, , Disp: , Rfl:    ergocalciferol (DRISDOL) 8000 UNIT/ML drops, 4 drops qd sublingual (Patient not taking: No sig reported), Disp: , Rfl:    lactobacillus acidophilus (BACID) TABS tablet, Take 1 tablet by mouth daily., Disp: , Rfl:    milk thistle 175 MG tablet, Take 175 mg by mouth daily., Disp: , Rfl:    Multiple Vitamins-Minerals (ANTIOXIDANT PO), Take 2 capsules by mouth daily. (Patient not taking: Reported on 08/19/2021), Disp: , Rfl:    OMEGA 3 1000 MG CAPS, Take 1 capsule by mouth daily. (Patient not taking: Reported on 08/19/2021), Disp: , Rfl:    TURMERIC PO, Take by mouth. (Patient not taking: No sig reported), Disp: , Rfl:

## 2022-04-13 DIAGNOSIS — J3089 Other allergic rhinitis: Secondary | ICD-10-CM | POA: Diagnosis not present

## 2022-04-13 DIAGNOSIS — J3081 Allergic rhinitis due to animal (cat) (dog) hair and dander: Secondary | ICD-10-CM | POA: Diagnosis not present

## 2022-04-13 DIAGNOSIS — J301 Allergic rhinitis due to pollen: Secondary | ICD-10-CM | POA: Diagnosis not present

## 2022-04-14 DIAGNOSIS — S22060A Wedge compression fracture of T7-T8 vertebra, initial encounter for closed fracture: Secondary | ICD-10-CM | POA: Diagnosis not present

## 2022-04-19 DIAGNOSIS — J301 Allergic rhinitis due to pollen: Secondary | ICD-10-CM | POA: Diagnosis not present

## 2022-04-19 DIAGNOSIS — J3081 Allergic rhinitis due to animal (cat) (dog) hair and dander: Secondary | ICD-10-CM | POA: Diagnosis not present

## 2022-04-19 DIAGNOSIS — J3089 Other allergic rhinitis: Secondary | ICD-10-CM | POA: Diagnosis not present

## 2022-04-28 DIAGNOSIS — J3081 Allergic rhinitis due to animal (cat) (dog) hair and dander: Secondary | ICD-10-CM | POA: Diagnosis not present

## 2022-04-28 DIAGNOSIS — J3089 Other allergic rhinitis: Secondary | ICD-10-CM | POA: Diagnosis not present

## 2022-04-28 DIAGNOSIS — J301 Allergic rhinitis due to pollen: Secondary | ICD-10-CM | POA: Diagnosis not present

## 2022-05-04 DIAGNOSIS — M9901 Segmental and somatic dysfunction of cervical region: Secondary | ICD-10-CM | POA: Diagnosis not present

## 2022-05-04 DIAGNOSIS — M5031 Other cervical disc degeneration,  high cervical region: Secondary | ICD-10-CM | POA: Diagnosis not present

## 2022-05-05 DIAGNOSIS — J301 Allergic rhinitis due to pollen: Secondary | ICD-10-CM | POA: Diagnosis not present

## 2022-05-05 DIAGNOSIS — J3089 Other allergic rhinitis: Secondary | ICD-10-CM | POA: Diagnosis not present

## 2022-05-05 DIAGNOSIS — J3081 Allergic rhinitis due to animal (cat) (dog) hair and dander: Secondary | ICD-10-CM | POA: Diagnosis not present

## 2022-05-17 DIAGNOSIS — J3089 Other allergic rhinitis: Secondary | ICD-10-CM | POA: Diagnosis not present

## 2022-05-17 DIAGNOSIS — J301 Allergic rhinitis due to pollen: Secondary | ICD-10-CM | POA: Diagnosis not present

## 2022-05-25 DIAGNOSIS — J3089 Other allergic rhinitis: Secondary | ICD-10-CM | POA: Diagnosis not present

## 2022-05-25 DIAGNOSIS — J3081 Allergic rhinitis due to animal (cat) (dog) hair and dander: Secondary | ICD-10-CM | POA: Diagnosis not present

## 2022-05-25 DIAGNOSIS — J301 Allergic rhinitis due to pollen: Secondary | ICD-10-CM | POA: Diagnosis not present

## 2022-06-01 DIAGNOSIS — J3089 Other allergic rhinitis: Secondary | ICD-10-CM | POA: Diagnosis not present

## 2022-06-01 DIAGNOSIS — J3081 Allergic rhinitis due to animal (cat) (dog) hair and dander: Secondary | ICD-10-CM | POA: Diagnosis not present

## 2022-06-01 DIAGNOSIS — J301 Allergic rhinitis due to pollen: Secondary | ICD-10-CM | POA: Diagnosis not present

## 2022-06-06 DIAGNOSIS — R002 Palpitations: Secondary | ICD-10-CM | POA: Diagnosis not present

## 2022-06-06 DIAGNOSIS — R6889 Other general symptoms and signs: Secondary | ICD-10-CM | POA: Diagnosis not present

## 2022-06-06 DIAGNOSIS — Z09 Encounter for follow-up examination after completed treatment for conditions other than malignant neoplasm: Secondary | ICD-10-CM | POA: Diagnosis not present

## 2022-06-06 DIAGNOSIS — M8088XD Other osteoporosis with current pathological fracture, vertebra(e), subsequent encounter for fracture with routine healing: Secondary | ICD-10-CM | POA: Diagnosis not present

## 2022-06-08 DIAGNOSIS — J3081 Allergic rhinitis due to animal (cat) (dog) hair and dander: Secondary | ICD-10-CM | POA: Diagnosis not present

## 2022-06-08 DIAGNOSIS — J3089 Other allergic rhinitis: Secondary | ICD-10-CM | POA: Diagnosis not present

## 2022-06-08 DIAGNOSIS — J301 Allergic rhinitis due to pollen: Secondary | ICD-10-CM | POA: Diagnosis not present

## 2022-06-16 DIAGNOSIS — J301 Allergic rhinitis due to pollen: Secondary | ICD-10-CM | POA: Diagnosis not present

## 2022-06-16 DIAGNOSIS — S22060A Wedge compression fracture of T7-T8 vertebra, initial encounter for closed fracture: Secondary | ICD-10-CM | POA: Diagnosis not present

## 2022-06-16 DIAGNOSIS — J3089 Other allergic rhinitis: Secondary | ICD-10-CM | POA: Diagnosis not present

## 2022-06-20 DIAGNOSIS — M5031 Other cervical disc degeneration,  high cervical region: Secondary | ICD-10-CM | POA: Diagnosis not present

## 2022-06-20 DIAGNOSIS — M9901 Segmental and somatic dysfunction of cervical region: Secondary | ICD-10-CM | POA: Diagnosis not present

## 2022-06-23 DIAGNOSIS — J301 Allergic rhinitis due to pollen: Secondary | ICD-10-CM | POA: Diagnosis not present

## 2022-06-23 DIAGNOSIS — J3089 Other allergic rhinitis: Secondary | ICD-10-CM | POA: Diagnosis not present

## 2022-06-23 DIAGNOSIS — J3081 Allergic rhinitis due to animal (cat) (dog) hair and dander: Secondary | ICD-10-CM | POA: Diagnosis not present

## 2022-06-27 DIAGNOSIS — J3081 Allergic rhinitis due to animal (cat) (dog) hair and dander: Secondary | ICD-10-CM | POA: Diagnosis not present

## 2022-06-27 DIAGNOSIS — J3089 Other allergic rhinitis: Secondary | ICD-10-CM | POA: Diagnosis not present

## 2022-06-27 DIAGNOSIS — J301 Allergic rhinitis due to pollen: Secondary | ICD-10-CM | POA: Diagnosis not present

## 2022-06-29 NOTE — Progress Notes (Unsigned)
Referring-Melanie Melinda Crutch, MD Reason for referral-palpitations and hyperlipidemia  HPI: 76 year old female for evaluation of hypertension and hyperlipidemia at request of Lawerance Cruel, MD.  Laboratories December 2022 showed LDL 176.  Patient states that she has palpitations at night when lying on her left side.  They are described as a flutter for 15 minutes.  She has some dyspnea related to asthma/allergies.  However no orthopnea, PND, pedal edema, chest pain or syncope.  Cardiology now asked to evaluate.  Current Outpatient Medications  Medication Sig Dispense Refill   albuterol (PROVENTIL HFA;VENTOLIN HFA) 108 (90 Base) MCG/ACT inhaler Inhale 1-2 puffs into the lungs every 6 (six) hours as needed for wheezing or shortness of breath.     azelastine (OPTIVAR) 0.05 % ophthalmic solution INSTILL 1 DROP TO AFFECTED EYE(S) TWICE A DAY  2   clonazePAM (KLONOPIN) 0.5 MG tablet TAKE 1-3 TABLETS BY MOUTH DAILY FOR SLEEP IF NEEDED 90 tablet 5   Coenzyme Q10 (COQ10) 200 MG CAPS Take 1 capsule by mouth daily.     Cyanocobalamin (VITAMIN B-12) 500 MCG SUBL Place 1 tablet under the tongue daily after lunch.     EPIPEN 2-PAK 0.3 MG/0.3ML DEVI      ergocalciferol (DRISDOL) 8000 UNIT/ML drops 4 drops qd sublingual     Multiple Vitamins-Minerals (ANTIOXIDANT PO) Take 2 capsules by mouth daily.     OMEGA 3 1000 MG CAPS Take 1 capsule by mouth daily.     Probiotic Product (PROBIOTIC DAILY PO) Probiotic     raloxifene (EVISTA) 60 MG tablet Take 60 mg by mouth daily.     triamcinolone (NASACORT ALLERGY 24HR) 55 MCG/ACT AERO nasal inhaler 1 spray in each nostril     No current facility-administered medications for this visit.    Allergies  Allergen Reactions   Cefzil [Cefprozil]    Erythromycin    Sulfa Antibiotics      Past Medical History:  Diagnosis Date   Allergy    Asthma    Environmental allergies    GERD (gastroesophageal reflux disease)    Hyperlipidemia    Hypertension     Murmur    Nervous disorder     Past Surgical History:  Procedure Laterality Date   ABDOMINAL HYSTERECTOMY     TONSILLECTOMY      Social History   Socioeconomic History   Marital status: Married    Spouse name: Not on file   Number of children: 3   Years of education: Not on file   Highest education level: Not on file  Occupational History   Occupation: retired  Tobacco Use   Smoking status: Former    Years: 1.00    Types: Cigarettes   Smokeless tobacco: Never  Substance and Sexual Activity   Alcohol use: Yes    Comment: rare-wine   Drug use: No   Sexual activity: Not on file  Other Topics Concern   Not on file  Social History Narrative   Not on file   Social Determinants of Health   Financial Resource Strain: Not on file  Food Insecurity: Not on file  Transportation Needs: Not on file  Physical Activity: Not on file  Stress: Not on file  Social Connections: Not on file  Intimate Partner Violence: Not on file    Family History  Problem Relation Age of Onset   Lung cancer Mother    Asthma Mother    Allergies Mother    Cancer - Other Other    Ovarian cancer  Other    Breast cancer Neg Hx     ROS: Leg cramps but no fevers or chills, productive cough, hemoptysis, dysphasia, odynophagia, melena, hematochezia, dysuria, hematuria, rash, seizure activity, orthopnea, PND, pedal edema, claudication. Remaining systems are negative.  Physical Exam:   Blood pressure 128/60, pulse 68, height '5\' 6"'$  (1.676 m), weight 132 lb 9.6 oz (60.1 kg), SpO2 98 %.  General:  Well developed/well nourished in NAD Skin warm/dry Patient not depressed No peripheral clubbing Back-normal HEENT-normal/normal eyelids Neck supple/normal carotid upstroke bilaterally; no bruits; no JVD; no thyromegaly chest - CTA/ normal expansion CV - RRR/normal S1 and S2; no murmurs, rubs or gallops;  PMI nondisplaced Abdomen -NT/ND, no HSM, no mass, + bowel sounds, no bruit 2+ femoral pulses, no  bruits Ext-no edema, chords, 2+ DP Neuro-grossly nonfocal  ECG -normal sinus rhythm at a rate of 68, first-degree AV block, RV conduction delay, left axis deviation, cannot rule out septal infarct.  Personally reviewed  A/P  1 palpitations-etiology unclear.  I will arrange an echocardiogram to assess LV function.  She has these on a nightly basis and we will therefore arrange a 3-day Zio patch.  Further therapy based on results.  2 hyperlipidemia-previous LDL significantly elevated.  We will arrange a calcium score.  If elevated will try low-dose statin therapy as she had side effects with some in the past by report.  Kirk Ruths, MD

## 2022-07-05 DIAGNOSIS — J3081 Allergic rhinitis due to animal (cat) (dog) hair and dander: Secondary | ICD-10-CM | POA: Diagnosis not present

## 2022-07-05 DIAGNOSIS — J3089 Other allergic rhinitis: Secondary | ICD-10-CM | POA: Diagnosis not present

## 2022-07-05 DIAGNOSIS — J301 Allergic rhinitis due to pollen: Secondary | ICD-10-CM | POA: Diagnosis not present

## 2022-07-12 ENCOUNTER — Encounter: Payer: Self-pay | Admitting: Cardiology

## 2022-07-12 ENCOUNTER — Ambulatory Visit: Payer: Medicare HMO | Attending: Cardiology

## 2022-07-12 ENCOUNTER — Ambulatory Visit: Payer: Medicare HMO | Attending: Cardiology | Admitting: Cardiology

## 2022-07-12 VITALS — BP 128/60 | HR 68 | Ht 66.0 in | Wt 132.6 lb

## 2022-07-12 DIAGNOSIS — E7801 Familial hypercholesterolemia: Secondary | ICD-10-CM

## 2022-07-12 DIAGNOSIS — R002 Palpitations: Secondary | ICD-10-CM

## 2022-07-12 DIAGNOSIS — J3089 Other allergic rhinitis: Secondary | ICD-10-CM | POA: Diagnosis not present

## 2022-07-12 DIAGNOSIS — J3081 Allergic rhinitis due to animal (cat) (dog) hair and dander: Secondary | ICD-10-CM | POA: Diagnosis not present

## 2022-07-12 DIAGNOSIS — Z136 Encounter for screening for cardiovascular disorders: Secondary | ICD-10-CM

## 2022-07-12 DIAGNOSIS — J301 Allergic rhinitis due to pollen: Secondary | ICD-10-CM | POA: Diagnosis not present

## 2022-07-12 NOTE — Progress Notes (Unsigned)
Enrolled for [employer] to mail a ZIO XT long term holter monitor to the patients address on file.  

## 2022-07-12 NOTE — Patient Instructions (Signed)
Testing/Procedures:  Your physician has requested that you have an echocardiogram. Echocardiography is a painless test that uses sound waves to create images of your heart. It provides your doctor with information about the size and shape of your heart and how well your heart's chambers and valves are working. This procedure takes approximately one hour. There are no restrictions for this procedure. [facility]  CORONARY CALCIUM SCORING CT SCAN AT THE [facility]   ZIO XT- Long Term Monitor Instructions  Your physician has requested you wear a ZIO patch monitor for 3 days.  This is a single patch monitor. [employer] supplies one patch monitor per enrollment. Additional stickers are not available. Please do not apply patch if you will be having a Nuclear Stress Test,  Echocardiogram, Cardiac CT, MRI, or Chest Xray during the period you would be wearing the  monitor. The patch cannot be worn during these tests. You cannot remove and re-apply the  ZIO XT patch monitor.  Your ZIO patch monitor will be mailed 3 day USPS to your address on file. It may take 3-5 days  to receive your monitor after you have been enrolled.  Once you have received your monitor, please review the enclosed instructions. Your monitor  has already been registered assigning a specific monitor serial # to you.  Billing and Patient Assistance Program Information  We have supplied [employer] with any of your insurance information on file for billing purposes. [employer] offers a sliding scale Patient Assistance Program for patients that do not have  insurance, or whose insurance does not completely cover the cost of the ZIO monitor.  You must apply for the Patient Assistance Program to qualify for this discounted rate.  To apply, please call [employer] at [phone], select option 4, select option 2, ask to apply for  Patient Assistance Program. Theodore Demark will ask your household income, and how many people  are in your  household. They will quote your out-of-pocket cost based on that information.  [employer] will also be able to set up a 22-month interest-free payment plan if needed.  Applying the monitor   Shave hair from upper left chest.  Hold abrader disc by orange tab. Rub abrader in 40 strokes over the upper left chest as  indicated in your monitor instructions.  Clean area with 4 enclosed alcohol pads. Let dry.  Apply patch as indicated in monitor instructions. Patch will be placed under collarbone on left  side of chest with arrow pointing upward.  Rub patch adhesive wings for 2 minutes. Remove white label marked "1". Remove the white  label marked "2". Rub patch adhesive wings for 2 additional minutes.  While looking in a mirror, press and release button in center of patch. A small green light will  flash 3-4 times. This will be your only indicator that the monitor has been turned on.  Do not shower for the first 24 hours. You may shower after the first 24 hours.  Press the button if you feel a symptom. You will hear a small click. Record Date, Time and  Symptom in the Patient Logbook.  When you are ready to remove the patch, follow instructions on the last 2 pages of Patient  Logbook. Stick patch monitor onto the last page of Patient Logbook.  Place Patient Logbook in the blue and white box. Use locking tab on box and tape box closed  securely. The blue and white box has prepaid postage on it. Please place it in the mailbox as  soon as possible. Your physician should have your test results approximately 7 days after the  monitor has been mailed back to [facility].  Call Russell at [phone] if you have questions regarding  your ZIO XT patch monitor. Call them immediately if you see an orange light blinking on your  monitor.  If your monitor falls off in less than 4 days, contact our Monitor department at [phone].  If your monitor becomes loose or falls off after  4 days call [employer] at [phone] for  suggestions on securing your monitor    Follow-Up: At [facility], you and your health needs are our priority.  As part of our continuing mission to provide you with exceptional heart care, we have created designated Provider Care Teams.  These Care Teams include your primary Cardiologist (physician) and Advanced Practice Providers (APPs -  Physician Assistants and Nurse Practitioners) who all work together to provide you with the care you need, when you need it.  We recommend signing up for the patient portal called "MyChart".  Sign up information is provided on this After Visit Summary.  MyChart is used to connect with patients for Virtual Visits (Telemedicine).  Patients are able to view lab/test results, encounter notes, upcoming appointments, etc.  Non-urgent messages can be sent to your provider as well.   To learn more about what you can do with MyChart, go to [email or web address].    Your next appointment:   12 month(s)  The format for your next appointment:   In Person  Provider:   Kirk Ruths, MD

## 2022-07-13 DIAGNOSIS — M5136 Other intervertebral disc degeneration, lumbar region: Secondary | ICD-10-CM | POA: Diagnosis not present

## 2022-07-13 DIAGNOSIS — M9903 Segmental and somatic dysfunction of lumbar region: Secondary | ICD-10-CM | POA: Diagnosis not present

## 2022-07-13 DIAGNOSIS — M9904 Segmental and somatic dysfunction of sacral region: Secondary | ICD-10-CM | POA: Diagnosis not present

## 2022-07-13 DIAGNOSIS — M9905 Segmental and somatic dysfunction of pelvic region: Secondary | ICD-10-CM | POA: Diagnosis not present

## 2022-07-14 DIAGNOSIS — R002 Palpitations: Secondary | ICD-10-CM

## 2022-07-18 DIAGNOSIS — J3089 Other allergic rhinitis: Secondary | ICD-10-CM | POA: Diagnosis not present

## 2022-07-18 DIAGNOSIS — J3081 Allergic rhinitis due to animal (cat) (dog) hair and dander: Secondary | ICD-10-CM | POA: Diagnosis not present

## 2022-07-18 DIAGNOSIS — J301 Allergic rhinitis due to pollen: Secondary | ICD-10-CM | POA: Diagnosis not present

## 2022-07-21 DIAGNOSIS — Z23 Encounter for immunization: Secondary | ICD-10-CM | POA: Diagnosis not present

## 2022-07-21 DIAGNOSIS — R002 Palpitations: Secondary | ICD-10-CM | POA: Diagnosis not present

## 2022-08-01 DIAGNOSIS — J3081 Allergic rhinitis due to animal (cat) (dog) hair and dander: Secondary | ICD-10-CM | POA: Diagnosis not present

## 2022-08-01 DIAGNOSIS — J301 Allergic rhinitis due to pollen: Secondary | ICD-10-CM | POA: Diagnosis not present

## 2022-08-01 DIAGNOSIS — J3089 Other allergic rhinitis: Secondary | ICD-10-CM | POA: Diagnosis not present

## 2022-08-04 ENCOUNTER — Ambulatory Visit (HOSPITAL_BASED_OUTPATIENT_CLINIC_OR_DEPARTMENT_OTHER)
Admission: RE | Admit: 2022-08-04 | Discharge: 2022-08-04 | Disposition: A | Discharge status: Home / Self Care | Payer: Medicare HMO | Source: Ambulatory Visit | Attending: Cardiology | Admitting: Cardiology

## 2022-08-04 ENCOUNTER — Ambulatory Visit (INDEPENDENT_AMBULATORY_CARE_PROVIDER_SITE_OTHER): Payer: Medicare HMO

## 2022-08-04 DIAGNOSIS — I371 Nonrheumatic pulmonary valve insufficiency: Secondary | ICD-10-CM | POA: Diagnosis not present

## 2022-08-04 DIAGNOSIS — R002 Palpitations: Secondary | ICD-10-CM | POA: Diagnosis not present

## 2022-08-04 DIAGNOSIS — Z136 Encounter for screening for cardiovascular disorders: Secondary | ICD-10-CM | POA: Insufficient documentation

## 2022-08-04 DIAGNOSIS — I081 Rheumatic disorders of both mitral and tricuspid valves: Secondary | ICD-10-CM | POA: Diagnosis not present

## 2022-08-05 ENCOUNTER — Telehealth: Payer: Self-pay | Admitting: *Deleted

## 2022-08-05 DIAGNOSIS — E7801 Familial hypercholesterolemia: Secondary | ICD-10-CM

## 2022-08-05 LAB — ECHOCARDIOGRAM COMPLETE
AR max vel: 2.3 cm2
AV Area VTI: 2.31 cm2
AV Area mean vel: 2 cm2
AV Mean grad: 4 mmHg
AV Peak grad: 8.4 mmHg
Ao pk vel: 1.45 m/s
Area-P 1/2: 4.33 cm2
S' Lateral: 2.31 cm

## 2022-08-05 MED ORDER — ASPIRIN 81 MG PO TBEC: 81.0000 mg | DELAYED_RELEASE_TABLET | Freq: Every day | ORAL | 3 refills | Status: DC

## 2022-08-05 MED ORDER — ROSUVASTATIN CALCIUM 20 MG PO TABS: 20.0000 mg | ORAL_TABLET | Freq: Every day | ORAL | 3 refills | Status: DC

## 2022-08-05 NOTE — Telephone Encounter (Signed)
pt aware of results  New script sent to the pharmacy  Lab orders mailed to the pt  

## 2022-08-05 NOTE — Telephone Encounter (Signed)
-----   Message from Lelon Perla, MD sent at 08/05/2022  7:45 AM EDT ----- Elevated Ca score; Add ASA 81 mg daily; add crestor 20 mg daily; lipids and liver 8 weeks. Kirk Ruths

## 2022-08-08 DIAGNOSIS — M5136 Other intervertebral disc degeneration, lumbar region: Secondary | ICD-10-CM | POA: Diagnosis not present

## 2022-08-08 DIAGNOSIS — M9905 Segmental and somatic dysfunction of pelvic region: Secondary | ICD-10-CM | POA: Diagnosis not present

## 2022-08-08 DIAGNOSIS — M9903 Segmental and somatic dysfunction of lumbar region: Secondary | ICD-10-CM | POA: Diagnosis not present

## 2022-08-08 DIAGNOSIS — M9904 Segmental and somatic dysfunction of sacral region: Secondary | ICD-10-CM | POA: Diagnosis not present

## 2022-08-11 DIAGNOSIS — M9903 Segmental and somatic dysfunction of lumbar region: Secondary | ICD-10-CM | POA: Diagnosis not present

## 2022-08-11 DIAGNOSIS — M9905 Segmental and somatic dysfunction of pelvic region: Secondary | ICD-10-CM | POA: Diagnosis not present

## 2022-08-11 DIAGNOSIS — M9904 Segmental and somatic dysfunction of sacral region: Secondary | ICD-10-CM | POA: Diagnosis not present

## 2022-08-11 DIAGNOSIS — M5136 Other intervertebral disc degeneration, lumbar region: Secondary | ICD-10-CM | POA: Diagnosis not present

## 2022-08-12 DIAGNOSIS — J3081 Allergic rhinitis due to animal (cat) (dog) hair and dander: Secondary | ICD-10-CM | POA: Diagnosis not present

## 2022-08-12 DIAGNOSIS — J301 Allergic rhinitis due to pollen: Secondary | ICD-10-CM | POA: Diagnosis not present

## 2022-08-12 DIAGNOSIS — J3089 Other allergic rhinitis: Secondary | ICD-10-CM | POA: Diagnosis not present

## 2022-08-15 ENCOUNTER — Other Ambulatory Visit: Payer: Self-pay | Admitting: Obstetrics and Gynecology

## 2022-08-15 DIAGNOSIS — Z1231 Encounter for screening mammogram for malignant neoplasm of breast: Secondary | ICD-10-CM

## 2022-08-17 ENCOUNTER — Ambulatory Visit
Admission: RE | Admit: 2022-08-17 | Discharge: 2022-08-17 | Disposition: A | Discharge status: Home / Self Care | Payer: Medicare HMO | Source: Ambulatory Visit | Attending: Family Medicine | Admitting: Family Medicine

## 2022-08-17 DIAGNOSIS — E2839 Other primary ovarian failure: Secondary | ICD-10-CM

## 2022-08-17 DIAGNOSIS — M810 Age-related osteoporosis without current pathological fracture: Secondary | ICD-10-CM | POA: Diagnosis not present

## 2022-08-17 DIAGNOSIS — Z780 Asymptomatic menopausal state: Secondary | ICD-10-CM | POA: Diagnosis not present

## 2022-08-18 DIAGNOSIS — J3089 Other allergic rhinitis: Secondary | ICD-10-CM | POA: Diagnosis not present

## 2022-08-18 DIAGNOSIS — J3081 Allergic rhinitis due to animal (cat) (dog) hair and dander: Secondary | ICD-10-CM | POA: Diagnosis not present

## 2022-08-18 NOTE — Progress Notes (Unsigned)
HPI female former smoker followed for chronic insomnia since fall with hand injury/reflex sympathetic dystrophy., Complicated by allergic rhinitis (Dr. Donneta Romberg), GERD Says she failed trazodone and Lunesta. She is afraid of what she has heard about Ambien side effects/sleepwalking her zaleplon "relaxes her" so she can go to sleep but often lets her wake during the night.   ----------------------------------------------------------------------------------.  08/19/21-  76 year old female former smoker followed for chronic insomnia since fall with hand injury/reflex sympathetic dystrophy., Complicated by allergic rhinitis (Dr. Donneta Romberg), GERD, HTN,  Med Hx: Failed trazodone, Doxepin, Benadryl, temazepam, Lunesta, zaleplon,  Uncomfortable with reported Ambien side effects/sleepwalking.  Zaleplon did not last long enough Clonazepam 0.5 1-3 at hs Covid vax- 4 Phizer Flu vax- had -----Pt states she have a few concerns Sleeping much better with clonazepam, which also helps keep her calmer in daytime. Her ritual is to read for 30 minutes after taking it, with a low blue-light reading lamp. Does not feel over medicated or unsteady. Quality of life seems much improved. No hx thyroid problems.  08/19/22- 76 year old female former smoker followed for chronic insomnia since fall with hand injury/reflex sympathetic dystrophy., Complicated by allergic rhinitis (Dr. Donneta Romberg), GERD, HTN,  Med Hx: Failed trazodone, Doxepin, Benadryl, temazepam, Lunesta, zaleplon,  Uncomfortable with reported Ambien side effects/sleepwalking.  Zaleplon did not last long enough -Clonazepam 0.5 1-3 at hs Covid vax- 3 Phizer Flu vax-had She is doing fairly well with sleep now and we will refill clonazepam.  She has multiple things on her mind and frets a lot.  Using earplugs and a sound machine to help control noise at home.  ROS-see HPI + = positive Constitutional:   No-   weight loss, night sweats, fevers, chills, fatigue,  lassitude. HEENT:   + headaches, no-difficulty swallowing, tooth/dental problems, sore throat,       No-  sneezing, itching, ear ache, nasal congestion, post nasal drip,  CV:  No-   chest pain, orthopnea, PND, +swelling in lower extremities, anasarca,  dizziness, palpitations Resp: No-   shortness of breath with exertion or at rest.              No-   productive cough,  No non-productive cough,  No- coughing up of blood.              No-   change in color of mucus.  No- wheezing.   Skin: No-   rash or lesions. GI:  No-   heartburn, indigestion, abdominal pain, nausea, vomiting,  GU:  MS:  No-   joint pain or swelling.   Neuro-     nothing unusual Psych:  No- change in mood or affect. No depression or+ anxiety.  No memory loss.  OBJ- Physical Exam General- Alert, Oriented, Affect-appropriate/ somewhat anxious personlity, Distress- none acute. Appears well.  Skin- rash-none, lesions- none, excoriation- none Lymphadenopathy- none Head- atraumatic            Eyes- Gross vision intact, PERRLA, conjunctivae and secretions clear            Ears- Hearing, canals-normal            Nose- Clear, no-Septal dev, mucus, polyps, erosion, perforation             Throat- Mallampati II , mucosa clear , drainage- none, tonsils- atrophic Neck- flexible , trachea midline, no stridor , thyroid nl, carotid no bruit Chest - symmetrical excursion , unlabored           Heart/CV- RRR , no murmur ,  no gallop  , no rub, nl s1 s2                           - JVD- none , edema- none, stasis changes- none, varices- none           Lung- clear to P&A, wheeze- none, cough- none , dullness-none, rub- none           Chest wall-  Abd-  Br/ Gen/ Rectal- Not done, not indicated Extrem- cyanosis- none, clubbing, none, atrophy- none, strength- nl Neuro- grossly intact to observation

## 2022-08-19 ENCOUNTER — Encounter: Payer: Self-pay | Admitting: Internal Medicine

## 2022-08-19 ENCOUNTER — Ambulatory Visit: Payer: Medicare HMO | Admitting: Internal Medicine

## 2022-08-19 DIAGNOSIS — J302 Other seasonal allergic rhinitis: Secondary | ICD-10-CM | POA: Diagnosis not present

## 2022-08-19 DIAGNOSIS — J3089 Other allergic rhinitis: Secondary | ICD-10-CM

## 2022-08-19 DIAGNOSIS — F5104 Psychophysiologic insomnia: Secondary | ICD-10-CM | POA: Diagnosis not present

## 2022-08-19 DIAGNOSIS — R69 Illness, unspecified: Secondary | ICD-10-CM | POA: Diagnosis not present

## 2022-08-19 MED ORDER — CLONAZEPAM 0.5 MG PO TABS: ORAL_TABLET | ORAL | 5 refills | Status: DC

## 2022-08-19 NOTE — Patient Instructions (Signed)
Clonazepam refilled  Please call if we can help

## 2022-08-24 ENCOUNTER — Encounter: Payer: Self-pay | Admitting: Internal Medicine

## 2022-08-24 NOTE — Assessment & Plan Note (Signed)
Insomnia will be a long-term pattern for her.  She is coping fairly well.  She will continue using clonazepam 0.5 mg, 1-3 tabs for sleep and continue with control of sleep environment including noise. Plan-clonazepam refilled

## 2022-08-24 NOTE — Assessment & Plan Note (Signed)
He works with her allergist.  Nasal discomfort does not appear to be impacting her sleep quality currently.

## 2022-08-29 DIAGNOSIS — H1045 Other chronic allergic conjunctivitis: Secondary | ICD-10-CM | POA: Diagnosis not present

## 2022-08-29 DIAGNOSIS — J3089 Other allergic rhinitis: Secondary | ICD-10-CM | POA: Diagnosis not present

## 2022-08-29 DIAGNOSIS — J301 Allergic rhinitis due to pollen: Secondary | ICD-10-CM | POA: Diagnosis not present

## 2022-08-29 DIAGNOSIS — J4520 Mild intermittent asthma, uncomplicated: Secondary | ICD-10-CM | POA: Diagnosis not present

## 2022-09-05 DIAGNOSIS — J3081 Allergic rhinitis due to animal (cat) (dog) hair and dander: Secondary | ICD-10-CM | POA: Diagnosis not present

## 2022-09-05 DIAGNOSIS — J3089 Other allergic rhinitis: Secondary | ICD-10-CM | POA: Diagnosis not present

## 2022-09-05 DIAGNOSIS — J301 Allergic rhinitis due to pollen: Secondary | ICD-10-CM | POA: Diagnosis not present

## 2022-09-09 DIAGNOSIS — J301 Allergic rhinitis due to pollen: Secondary | ICD-10-CM | POA: Diagnosis not present

## 2022-09-14 DIAGNOSIS — J3081 Allergic rhinitis due to animal (cat) (dog) hair and dander: Secondary | ICD-10-CM | POA: Diagnosis not present

## 2022-09-14 DIAGNOSIS — J301 Allergic rhinitis due to pollen: Secondary | ICD-10-CM | POA: Diagnosis not present

## 2022-09-14 DIAGNOSIS — J3089 Other allergic rhinitis: Secondary | ICD-10-CM | POA: Diagnosis not present

## 2022-09-19 DIAGNOSIS — M9904 Segmental and somatic dysfunction of sacral region: Secondary | ICD-10-CM | POA: Diagnosis not present

## 2022-09-19 DIAGNOSIS — M9903 Segmental and somatic dysfunction of lumbar region: Secondary | ICD-10-CM | POA: Diagnosis not present

## 2022-09-19 DIAGNOSIS — M9905 Segmental and somatic dysfunction of pelvic region: Secondary | ICD-10-CM | POA: Diagnosis not present

## 2022-09-19 DIAGNOSIS — M5136 Other intervertebral disc degeneration, lumbar region: Secondary | ICD-10-CM | POA: Diagnosis not present

## 2022-09-20 DIAGNOSIS — J3089 Other allergic rhinitis: Secondary | ICD-10-CM | POA: Diagnosis not present

## 2022-09-20 DIAGNOSIS — J301 Allergic rhinitis due to pollen: Secondary | ICD-10-CM | POA: Diagnosis not present

## 2022-09-26 DIAGNOSIS — J301 Allergic rhinitis due to pollen: Secondary | ICD-10-CM | POA: Diagnosis not present

## 2022-09-26 DIAGNOSIS — M5136 Other intervertebral disc degeneration, lumbar region: Secondary | ICD-10-CM | POA: Diagnosis not present

## 2022-09-26 DIAGNOSIS — J3081 Allergic rhinitis due to animal (cat) (dog) hair and dander: Secondary | ICD-10-CM | POA: Diagnosis not present

## 2022-09-26 DIAGNOSIS — E7801 Familial hypercholesterolemia: Secondary | ICD-10-CM | POA: Diagnosis not present

## 2022-09-26 DIAGNOSIS — M9904 Segmental and somatic dysfunction of sacral region: Secondary | ICD-10-CM | POA: Diagnosis not present

## 2022-09-26 DIAGNOSIS — M9903 Segmental and somatic dysfunction of lumbar region: Secondary | ICD-10-CM | POA: Diagnosis not present

## 2022-09-26 DIAGNOSIS — J3089 Other allergic rhinitis: Secondary | ICD-10-CM | POA: Diagnosis not present

## 2022-09-26 DIAGNOSIS — M9905 Segmental and somatic dysfunction of pelvic region: Secondary | ICD-10-CM | POA: Diagnosis not present

## 2022-09-27 ENCOUNTER — Encounter: Payer: Self-pay | Admitting: *Deleted

## 2022-09-27 LAB — HEPATIC FUNCTION PANEL
ALT: 30 IU/L (ref 0–32)
AST: 34 IU/L (ref 0–40)
Albumin: 4.3 g/dL (ref 3.8–4.8)
Alkaline Phosphatase: 101 IU/L (ref 44–121)
Bilirubin Total: 0.7 mg/dL (ref 0.0–1.2)
Bilirubin, Direct: 0.24 mg/dL (ref 0.00–0.40)
Total Protein: 6.8 g/dL (ref 6.0–8.5)

## 2022-09-27 LAB — LIPID PANEL
Chol/HDL Ratio: 1.9 ratio (ref 0.0–4.4)
Cholesterol, Total: 141 mg/dL (ref 100–199)
HDL: 76 mg/dL (ref >39)
LDL Chol Calc (NIH): 53 mg/dL (ref 0–99)
Triglycerides: 58 mg/dL (ref 0–149)
VLDL Cholesterol Cal: 12 mg/dL (ref 5–40)

## 2022-09-28 DIAGNOSIS — M810 Age-related osteoporosis without current pathological fracture: Secondary | ICD-10-CM | POA: Diagnosis not present

## 2022-09-28 DIAGNOSIS — R252 Cramp and spasm: Secondary | ICD-10-CM | POA: Diagnosis not present

## 2022-09-28 DIAGNOSIS — Z6822 Body mass index (BMI) 22.0-22.9, adult: Secondary | ICD-10-CM | POA: Diagnosis not present

## 2022-09-28 DIAGNOSIS — R030 Elevated blood-pressure reading, without diagnosis of hypertension: Secondary | ICD-10-CM | POA: Diagnosis not present

## 2022-10-05 DIAGNOSIS — J301 Allergic rhinitis due to pollen: Secondary | ICD-10-CM | POA: Diagnosis not present

## 2022-10-05 DIAGNOSIS — J3089 Other allergic rhinitis: Secondary | ICD-10-CM | POA: Diagnosis not present

## 2022-10-05 DIAGNOSIS — J3081 Allergic rhinitis due to animal (cat) (dog) hair and dander: Secondary | ICD-10-CM | POA: Diagnosis not present

## 2022-10-07 DIAGNOSIS — L900 Lichen sclerosus et atrophicus: Secondary | ICD-10-CM | POA: Diagnosis not present

## 2022-10-07 DIAGNOSIS — Z6822 Body mass index (BMI) 22.0-22.9, adult: Secondary | ICD-10-CM | POA: Diagnosis not present

## 2022-10-07 DIAGNOSIS — M810 Age-related osteoporosis without current pathological fracture: Secondary | ICD-10-CM | POA: Diagnosis not present

## 2022-10-07 DIAGNOSIS — Z01419 Encounter for gynecological examination (general) (routine) without abnormal findings: Secondary | ICD-10-CM | POA: Diagnosis not present

## 2022-10-12 DIAGNOSIS — J3081 Allergic rhinitis due to animal (cat) (dog) hair and dander: Secondary | ICD-10-CM | POA: Diagnosis not present

## 2022-10-12 DIAGNOSIS — J3089 Other allergic rhinitis: Secondary | ICD-10-CM | POA: Diagnosis not present

## 2022-10-12 DIAGNOSIS — J301 Allergic rhinitis due to pollen: Secondary | ICD-10-CM | POA: Diagnosis not present

## 2022-10-19 DIAGNOSIS — J3081 Allergic rhinitis due to animal (cat) (dog) hair and dander: Secondary | ICD-10-CM | POA: Diagnosis not present

## 2022-10-19 DIAGNOSIS — J3089 Other allergic rhinitis: Secondary | ICD-10-CM | POA: Diagnosis not present

## 2022-10-19 DIAGNOSIS — J301 Allergic rhinitis due to pollen: Secondary | ICD-10-CM | POA: Diagnosis not present

## 2022-10-24 DIAGNOSIS — M9904 Segmental and somatic dysfunction of sacral region: Secondary | ICD-10-CM | POA: Diagnosis not present

## 2022-10-24 DIAGNOSIS — M9903 Segmental and somatic dysfunction of lumbar region: Secondary | ICD-10-CM | POA: Diagnosis not present

## 2022-10-24 DIAGNOSIS — M9905 Segmental and somatic dysfunction of pelvic region: Secondary | ICD-10-CM | POA: Diagnosis not present

## 2022-10-24 DIAGNOSIS — M5136 Other intervertebral disc degeneration, lumbar region: Secondary | ICD-10-CM | POA: Diagnosis not present

## 2022-10-26 DIAGNOSIS — J3081 Allergic rhinitis due to animal (cat) (dog) hair and dander: Secondary | ICD-10-CM | POA: Diagnosis not present

## 2022-10-26 DIAGNOSIS — J301 Allergic rhinitis due to pollen: Secondary | ICD-10-CM | POA: Diagnosis not present

## 2022-10-26 DIAGNOSIS — J3089 Other allergic rhinitis: Secondary | ICD-10-CM | POA: Diagnosis not present

## 2022-11-02 DIAGNOSIS — J301 Allergic rhinitis due to pollen: Secondary | ICD-10-CM | POA: Diagnosis not present

## 2022-11-02 DIAGNOSIS — J3081 Allergic rhinitis due to animal (cat) (dog) hair and dander: Secondary | ICD-10-CM | POA: Diagnosis not present

## 2022-11-02 DIAGNOSIS — J3089 Other allergic rhinitis: Secondary | ICD-10-CM | POA: Diagnosis not present

## 2022-11-08 DIAGNOSIS — M9904 Segmental and somatic dysfunction of sacral region: Secondary | ICD-10-CM | POA: Diagnosis not present

## 2022-11-08 DIAGNOSIS — M9903 Segmental and somatic dysfunction of lumbar region: Secondary | ICD-10-CM | POA: Diagnosis not present

## 2022-11-08 DIAGNOSIS — M9905 Segmental and somatic dysfunction of pelvic region: Secondary | ICD-10-CM | POA: Diagnosis not present

## 2022-11-08 DIAGNOSIS — M5136 Other intervertebral disc degeneration, lumbar region: Secondary | ICD-10-CM | POA: Diagnosis not present

## 2022-11-09 DIAGNOSIS — J301 Allergic rhinitis due to pollen: Secondary | ICD-10-CM | POA: Diagnosis not present

## 2022-11-09 DIAGNOSIS — J3081 Allergic rhinitis due to animal (cat) (dog) hair and dander: Secondary | ICD-10-CM | POA: Diagnosis not present

## 2022-11-09 DIAGNOSIS — J3089 Other allergic rhinitis: Secondary | ICD-10-CM | POA: Diagnosis not present

## 2022-11-14 DIAGNOSIS — Z131 Encounter for screening for diabetes mellitus: Secondary | ICD-10-CM | POA: Diagnosis not present

## 2022-11-14 DIAGNOSIS — E559 Vitamin D deficiency, unspecified: Secondary | ICD-10-CM | POA: Diagnosis not present

## 2022-11-14 DIAGNOSIS — E7800 Pure hypercholesterolemia, unspecified: Secondary | ICD-10-CM | POA: Diagnosis not present

## 2022-11-14 DIAGNOSIS — R233 Spontaneous ecchymoses: Secondary | ICD-10-CM | POA: Diagnosis not present

## 2022-11-16 DIAGNOSIS — J3081 Allergic rhinitis due to animal (cat) (dog) hair and dander: Secondary | ICD-10-CM | POA: Diagnosis not present

## 2022-11-16 DIAGNOSIS — J301 Allergic rhinitis due to pollen: Secondary | ICD-10-CM | POA: Diagnosis not present

## 2022-11-16 DIAGNOSIS — J3089 Other allergic rhinitis: Secondary | ICD-10-CM | POA: Diagnosis not present

## 2022-11-21 DIAGNOSIS — L900 Lichen sclerosus et atrophicus: Secondary | ICD-10-CM | POA: Diagnosis not present

## 2022-11-23 DIAGNOSIS — M5136 Other intervertebral disc degeneration, lumbar region: Secondary | ICD-10-CM | POA: Diagnosis not present

## 2022-11-23 DIAGNOSIS — Z6821 Body mass index (BMI) 21.0-21.9, adult: Secondary | ICD-10-CM | POA: Diagnosis not present

## 2022-11-23 DIAGNOSIS — M9905 Segmental and somatic dysfunction of pelvic region: Secondary | ICD-10-CM | POA: Diagnosis not present

## 2022-11-23 DIAGNOSIS — R609 Edema, unspecified: Secondary | ICD-10-CM | POA: Diagnosis not present

## 2022-11-23 DIAGNOSIS — M9903 Segmental and somatic dysfunction of lumbar region: Secondary | ICD-10-CM | POA: Diagnosis not present

## 2022-11-23 DIAGNOSIS — M9904 Segmental and somatic dysfunction of sacral region: Secondary | ICD-10-CM | POA: Diagnosis not present

## 2022-11-23 DIAGNOSIS — Z0000 Encounter for general adult medical examination without abnormal findings: Secondary | ICD-10-CM | POA: Diagnosis not present

## 2022-11-23 DIAGNOSIS — M810 Age-related osteoporosis without current pathological fracture: Secondary | ICD-10-CM | POA: Diagnosis not present

## 2022-11-23 DIAGNOSIS — Z23 Encounter for immunization: Secondary | ICD-10-CM | POA: Diagnosis not present

## 2022-11-30 DIAGNOSIS — J301 Allergic rhinitis due to pollen: Secondary | ICD-10-CM | POA: Diagnosis not present

## 2022-11-30 DIAGNOSIS — J3089 Other allergic rhinitis: Secondary | ICD-10-CM | POA: Diagnosis not present

## 2022-11-30 DIAGNOSIS — J3081 Allergic rhinitis due to animal (cat) (dog) hair and dander: Secondary | ICD-10-CM | POA: Diagnosis not present

## 2022-12-06 DIAGNOSIS — J301 Allergic rhinitis due to pollen: Secondary | ICD-10-CM | POA: Diagnosis not present

## 2022-12-06 DIAGNOSIS — J3081 Allergic rhinitis due to animal (cat) (dog) hair and dander: Secondary | ICD-10-CM | POA: Diagnosis not present

## 2022-12-06 DIAGNOSIS — J3089 Other allergic rhinitis: Secondary | ICD-10-CM | POA: Diagnosis not present

## 2022-12-08 ENCOUNTER — Ambulatory Visit
Admission: RE | Admit: 2022-12-08 | Discharge: 2022-12-08 | Disposition: A | Discharge status: Home / Self Care | Payer: Medicare HMO | Source: Ambulatory Visit | Attending: Obstetrics and Gynecology | Admitting: Obstetrics and Gynecology

## 2022-12-08 DIAGNOSIS — Z1231 Encounter for screening mammogram for malignant neoplasm of breast: Secondary | ICD-10-CM

## 2022-12-14 DIAGNOSIS — J3081 Allergic rhinitis due to animal (cat) (dog) hair and dander: Secondary | ICD-10-CM | POA: Diagnosis not present

## 2022-12-14 DIAGNOSIS — J3089 Other allergic rhinitis: Secondary | ICD-10-CM | POA: Diagnosis not present

## 2022-12-14 DIAGNOSIS — J301 Allergic rhinitis due to pollen: Secondary | ICD-10-CM | POA: Diagnosis not present

## 2022-12-21 DIAGNOSIS — J3081 Allergic rhinitis due to animal (cat) (dog) hair and dander: Secondary | ICD-10-CM | POA: Diagnosis not present

## 2022-12-21 DIAGNOSIS — J301 Allergic rhinitis due to pollen: Secondary | ICD-10-CM | POA: Diagnosis not present

## 2022-12-21 DIAGNOSIS — J3089 Other allergic rhinitis: Secondary | ICD-10-CM | POA: Diagnosis not present

## 2022-12-28 DIAGNOSIS — M5136 Other intervertebral disc degeneration, lumbar region: Secondary | ICD-10-CM | POA: Diagnosis not present

## 2022-12-28 DIAGNOSIS — M9903 Segmental and somatic dysfunction of lumbar region: Secondary | ICD-10-CM | POA: Diagnosis not present

## 2022-12-28 DIAGNOSIS — M9905 Segmental and somatic dysfunction of pelvic region: Secondary | ICD-10-CM | POA: Diagnosis not present

## 2022-12-28 DIAGNOSIS — M9904 Segmental and somatic dysfunction of sacral region: Secondary | ICD-10-CM | POA: Diagnosis not present

## 2022-12-29 DIAGNOSIS — M5136 Other intervertebral disc degeneration, lumbar region: Secondary | ICD-10-CM | POA: Diagnosis not present

## 2022-12-29 DIAGNOSIS — J301 Allergic rhinitis due to pollen: Secondary | ICD-10-CM | POA: Diagnosis not present

## 2022-12-29 DIAGNOSIS — J3081 Allergic rhinitis due to animal (cat) (dog) hair and dander: Secondary | ICD-10-CM | POA: Diagnosis not present

## 2022-12-29 DIAGNOSIS — M9903 Segmental and somatic dysfunction of lumbar region: Secondary | ICD-10-CM | POA: Diagnosis not present

## 2022-12-29 DIAGNOSIS — M9905 Segmental and somatic dysfunction of pelvic region: Secondary | ICD-10-CM | POA: Diagnosis not present

## 2022-12-29 DIAGNOSIS — M9904 Segmental and somatic dysfunction of sacral region: Secondary | ICD-10-CM | POA: Diagnosis not present

## 2022-12-29 DIAGNOSIS — J3089 Other allergic rhinitis: Secondary | ICD-10-CM | POA: Diagnosis not present

## 2023-01-02 DIAGNOSIS — M5136 Other intervertebral disc degeneration, lumbar region: Secondary | ICD-10-CM | POA: Diagnosis not present

## 2023-01-02 DIAGNOSIS — M9904 Segmental and somatic dysfunction of sacral region: Secondary | ICD-10-CM | POA: Diagnosis not present

## 2023-01-02 DIAGNOSIS — M9903 Segmental and somatic dysfunction of lumbar region: Secondary | ICD-10-CM | POA: Diagnosis not present

## 2023-01-02 DIAGNOSIS — M9905 Segmental and somatic dysfunction of pelvic region: Secondary | ICD-10-CM | POA: Diagnosis not present

## 2023-01-04 DIAGNOSIS — R070 Pain in throat: Secondary | ICD-10-CM | POA: Diagnosis not present

## 2023-01-04 DIAGNOSIS — J069 Acute upper respiratory infection, unspecified: Secondary | ICD-10-CM | POA: Diagnosis not present

## 2023-01-05 DIAGNOSIS — J3081 Allergic rhinitis due to animal (cat) (dog) hair and dander: Secondary | ICD-10-CM | POA: Diagnosis not present

## 2023-01-05 DIAGNOSIS — M9904 Segmental and somatic dysfunction of sacral region: Secondary | ICD-10-CM | POA: Diagnosis not present

## 2023-01-05 DIAGNOSIS — M5136 Other intervertebral disc degeneration, lumbar region: Secondary | ICD-10-CM | POA: Diagnosis not present

## 2023-01-05 DIAGNOSIS — M9903 Segmental and somatic dysfunction of lumbar region: Secondary | ICD-10-CM | POA: Diagnosis not present

## 2023-01-05 DIAGNOSIS — M9905 Segmental and somatic dysfunction of pelvic region: Secondary | ICD-10-CM | POA: Diagnosis not present

## 2023-01-05 DIAGNOSIS — J3089 Other allergic rhinitis: Secondary | ICD-10-CM | POA: Diagnosis not present

## 2023-01-05 DIAGNOSIS — J301 Allergic rhinitis due to pollen: Secondary | ICD-10-CM | POA: Diagnosis not present

## 2023-01-06 DIAGNOSIS — H0012 Chalazion right lower eyelid: Secondary | ICD-10-CM | POA: Diagnosis not present

## 2023-01-11 DIAGNOSIS — J3089 Other allergic rhinitis: Secondary | ICD-10-CM | POA: Diagnosis not present

## 2023-01-13 DIAGNOSIS — J301 Allergic rhinitis due to pollen: Secondary | ICD-10-CM | POA: Diagnosis not present

## 2023-01-13 DIAGNOSIS — J3081 Allergic rhinitis due to animal (cat) (dog) hair and dander: Secondary | ICD-10-CM | POA: Diagnosis not present

## 2023-01-13 DIAGNOSIS — J3089 Other allergic rhinitis: Secondary | ICD-10-CM | POA: Diagnosis not present

## 2023-01-17 DIAGNOSIS — D485 Neoplasm of uncertain behavior of skin: Secondary | ICD-10-CM | POA: Diagnosis not present

## 2023-01-17 DIAGNOSIS — L720 Epidermal cyst: Secondary | ICD-10-CM | POA: Diagnosis not present

## 2023-01-17 DIAGNOSIS — L814 Other melanin hyperpigmentation: Secondary | ICD-10-CM | POA: Diagnosis not present

## 2023-01-17 DIAGNOSIS — D2262 Melanocytic nevi of left upper limb, including shoulder: Secondary | ICD-10-CM | POA: Diagnosis not present

## 2023-01-17 DIAGNOSIS — D1801 Hemangioma of skin and subcutaneous tissue: Secondary | ICD-10-CM | POA: Diagnosis not present

## 2023-01-17 DIAGNOSIS — L821 Other seborrheic keratosis: Secondary | ICD-10-CM | POA: Diagnosis not present

## 2023-01-17 DIAGNOSIS — L570 Actinic keratosis: Secondary | ICD-10-CM | POA: Diagnosis not present

## 2023-01-19 DIAGNOSIS — J3081 Allergic rhinitis due to animal (cat) (dog) hair and dander: Secondary | ICD-10-CM | POA: Diagnosis not present

## 2023-01-19 DIAGNOSIS — J3089 Other allergic rhinitis: Secondary | ICD-10-CM | POA: Diagnosis not present

## 2023-01-19 DIAGNOSIS — J301 Allergic rhinitis due to pollen: Secondary | ICD-10-CM | POA: Diagnosis not present

## 2023-01-20 ENCOUNTER — Other Ambulatory Visit: Payer: Self-pay | Admitting: Internal Medicine

## 2023-01-20 NOTE — Telephone Encounter (Signed)
Please advise on refill request   Allergies  Allergen Reactions   Cefzil [Cefprozil]    Erythromycin    Sulfa Antibiotics      Current Outpatient Medications:    albuterol (PROVENTIL HFA;VENTOLIN HFA) 108 (90 Base) MCG/ACT inhaler, Inhale 1-2 puffs into the lungs every 6 (six) hours as needed for wheezing or shortness of breath., Disp: , Rfl:    aspirin EC 81 MG tablet, Take 1 tablet (81 mg total) by mouth daily. Swallow whole., Disp: 90 tablet, Rfl: 3   azelastine (OPTIVAR) 0.05 % ophthalmic solution, INSTILL 1 DROP TO AFFECTED EYE(S) TWICE A DAY, Disp: , Rfl: 2   clonazePAM (KLONOPIN) 0.5 MG tablet, 1-3 tabs at bedtime for sleep, Disp: 90 tablet, Rfl: 5   Coenzyme Q10 (COQ10) 200 MG CAPS, Take 1 capsule by mouth daily., Disp: , Rfl:    Cyanocobalamin (VITAMIN B-12) 500 MCG SUBL, Place 1 tablet under the tongue daily after lunch., Disp: , Rfl:    EPIPEN 2-PAK 0.3 MG/0.3ML DEVI, , Disp: , Rfl:    ergocalciferol (DRISDOL) 8000 UNIT/ML drops, 4 drops qd sublingual, Disp: , Rfl:    Multiple Vitamins-Minerals (ANTIOXIDANT PO), Take 2 capsules by mouth daily., Disp: , Rfl:    OMEGA 3 1000 MG CAPS, Take 1 capsule by mouth daily., Disp: , Rfl:    Probiotic Product (PROBIOTIC DAILY PO), Probiotic, Disp: , Rfl:    raloxifene (EVISTA) 60 MG tablet, Take 60 mg by mouth daily., Disp: , Rfl:    rosuvastatin (CRESTOR) 20 MG tablet, Take 1 tablet (20 mg total) by mouth daily., Disp: 90 tablet, Rfl: 3   triamcinolone (NASACORT ALLERGY 24HR) 55 MCG/ACT AERO nasal inhaler, 1 spray in each nostril, Disp: , Rfl:

## 2023-01-20 NOTE — Telephone Encounter (Signed)
Clonazepam refilled at [facility] 

## 2023-01-25 DIAGNOSIS — J3089 Other allergic rhinitis: Secondary | ICD-10-CM | POA: Diagnosis not present

## 2023-01-25 DIAGNOSIS — J3081 Allergic rhinitis due to animal (cat) (dog) hair and dander: Secondary | ICD-10-CM | POA: Diagnosis not present

## 2023-01-25 DIAGNOSIS — J301 Allergic rhinitis due to pollen: Secondary | ICD-10-CM | POA: Diagnosis not present

## 2023-02-01 DIAGNOSIS — J301 Allergic rhinitis due to pollen: Secondary | ICD-10-CM | POA: Diagnosis not present

## 2023-02-01 DIAGNOSIS — J3089 Other allergic rhinitis: Secondary | ICD-10-CM | POA: Diagnosis not present

## 2023-02-08 DIAGNOSIS — J3089 Other allergic rhinitis: Secondary | ICD-10-CM | POA: Diagnosis not present

## 2023-02-08 DIAGNOSIS — J301 Allergic rhinitis due to pollen: Secondary | ICD-10-CM | POA: Diagnosis not present

## 2023-02-08 DIAGNOSIS — J3081 Allergic rhinitis due to animal (cat) (dog) hair and dander: Secondary | ICD-10-CM | POA: Diagnosis not present

## 2023-02-15 DIAGNOSIS — J301 Allergic rhinitis due to pollen: Secondary | ICD-10-CM | POA: Diagnosis not present

## 2023-02-15 DIAGNOSIS — J3089 Other allergic rhinitis: Secondary | ICD-10-CM | POA: Diagnosis not present

## 2023-02-15 DIAGNOSIS — J3081 Allergic rhinitis due to animal (cat) (dog) hair and dander: Secondary | ICD-10-CM | POA: Diagnosis not present

## 2023-02-26 DIAGNOSIS — J189 Pneumonia, unspecified organism: Secondary | ICD-10-CM | POA: Diagnosis not present

## 2023-03-07 DIAGNOSIS — M9905 Segmental and somatic dysfunction of pelvic region: Secondary | ICD-10-CM | POA: Diagnosis not present

## 2023-03-07 DIAGNOSIS — M9904 Segmental and somatic dysfunction of sacral region: Secondary | ICD-10-CM | POA: Diagnosis not present

## 2023-03-07 DIAGNOSIS — J3081 Allergic rhinitis due to animal (cat) (dog) hair and dander: Secondary | ICD-10-CM | POA: Diagnosis not present

## 2023-03-07 DIAGNOSIS — R051 Acute cough: Secondary | ICD-10-CM | POA: Diagnosis not present

## 2023-03-07 DIAGNOSIS — J301 Allergic rhinitis due to pollen: Secondary | ICD-10-CM | POA: Diagnosis not present

## 2023-03-07 DIAGNOSIS — M5136 Other intervertebral disc degeneration, lumbar region: Secondary | ICD-10-CM | POA: Diagnosis not present

## 2023-03-07 DIAGNOSIS — J3089 Other allergic rhinitis: Secondary | ICD-10-CM | POA: Diagnosis not present

## 2023-03-07 DIAGNOSIS — M9903 Segmental and somatic dysfunction of lumbar region: Secondary | ICD-10-CM | POA: Diagnosis not present

## 2023-03-15 DIAGNOSIS — M9905 Segmental and somatic dysfunction of pelvic region: Secondary | ICD-10-CM | POA: Diagnosis not present

## 2023-03-15 DIAGNOSIS — J301 Allergic rhinitis due to pollen: Secondary | ICD-10-CM | POA: Diagnosis not present

## 2023-03-15 DIAGNOSIS — J3089 Other allergic rhinitis: Secondary | ICD-10-CM | POA: Diagnosis not present

## 2023-03-15 DIAGNOSIS — M9903 Segmental and somatic dysfunction of lumbar region: Secondary | ICD-10-CM | POA: Diagnosis not present

## 2023-03-15 DIAGNOSIS — J3081 Allergic rhinitis due to animal (cat) (dog) hair and dander: Secondary | ICD-10-CM | POA: Diagnosis not present

## 2023-03-15 DIAGNOSIS — M5136 Other intervertebral disc degeneration, lumbar region: Secondary | ICD-10-CM | POA: Diagnosis not present

## 2023-03-15 DIAGNOSIS — M9904 Segmental and somatic dysfunction of sacral region: Secondary | ICD-10-CM | POA: Diagnosis not present

## 2023-03-20 DIAGNOSIS — M9905 Segmental and somatic dysfunction of pelvic region: Secondary | ICD-10-CM | POA: Diagnosis not present

## 2023-03-20 DIAGNOSIS — M5136 Other intervertebral disc degeneration, lumbar region: Secondary | ICD-10-CM | POA: Diagnosis not present

## 2023-03-20 DIAGNOSIS — M9903 Segmental and somatic dysfunction of lumbar region: Secondary | ICD-10-CM | POA: Diagnosis not present

## 2023-03-20 DIAGNOSIS — M9904 Segmental and somatic dysfunction of sacral region: Secondary | ICD-10-CM | POA: Diagnosis not present

## 2023-03-21 DIAGNOSIS — L900 Lichen sclerosus et atrophicus: Secondary | ICD-10-CM | POA: Diagnosis not present

## 2023-03-21 DIAGNOSIS — N952 Postmenopausal atrophic vaginitis: Secondary | ICD-10-CM | POA: Diagnosis not present

## 2023-03-23 DIAGNOSIS — J3089 Other allergic rhinitis: Secondary | ICD-10-CM | POA: Diagnosis not present

## 2023-03-23 DIAGNOSIS — J301 Allergic rhinitis due to pollen: Secondary | ICD-10-CM | POA: Diagnosis not present

## 2023-03-28 DIAGNOSIS — H5203 Hypermetropia, bilateral: Secondary | ICD-10-CM | POA: Diagnosis not present

## 2023-03-28 DIAGNOSIS — H2513 Age-related nuclear cataract, bilateral: Secondary | ICD-10-CM | POA: Diagnosis not present

## 2023-03-28 DIAGNOSIS — H52203 Unspecified astigmatism, bilateral: Secondary | ICD-10-CM | POA: Diagnosis not present

## 2023-03-29 DIAGNOSIS — J3089 Other allergic rhinitis: Secondary | ICD-10-CM | POA: Diagnosis not present

## 2023-03-29 DIAGNOSIS — J301 Allergic rhinitis due to pollen: Secondary | ICD-10-CM | POA: Diagnosis not present

## 2023-03-29 DIAGNOSIS — J3081 Allergic rhinitis due to animal (cat) (dog) hair and dander: Secondary | ICD-10-CM | POA: Diagnosis not present

## 2023-04-05 DIAGNOSIS — J301 Allergic rhinitis due to pollen: Secondary | ICD-10-CM | POA: Diagnosis not present

## 2023-04-05 DIAGNOSIS — J3081 Allergic rhinitis due to animal (cat) (dog) hair and dander: Secondary | ICD-10-CM | POA: Diagnosis not present

## 2023-04-05 DIAGNOSIS — J3089 Other allergic rhinitis: Secondary | ICD-10-CM | POA: Diagnosis not present

## 2023-04-11 DIAGNOSIS — M9903 Segmental and somatic dysfunction of lumbar region: Secondary | ICD-10-CM | POA: Diagnosis not present

## 2023-04-11 DIAGNOSIS — M5136 Other intervertebral disc degeneration, lumbar region: Secondary | ICD-10-CM | POA: Diagnosis not present

## 2023-04-11 DIAGNOSIS — M9904 Segmental and somatic dysfunction of sacral region: Secondary | ICD-10-CM | POA: Diagnosis not present

## 2023-04-11 DIAGNOSIS — M9905 Segmental and somatic dysfunction of pelvic region: Secondary | ICD-10-CM | POA: Diagnosis not present

## 2023-04-13 DIAGNOSIS — M9903 Segmental and somatic dysfunction of lumbar region: Secondary | ICD-10-CM | POA: Diagnosis not present

## 2023-04-13 DIAGNOSIS — J3089 Other allergic rhinitis: Secondary | ICD-10-CM | POA: Diagnosis not present

## 2023-04-13 DIAGNOSIS — M9904 Segmental and somatic dysfunction of sacral region: Secondary | ICD-10-CM | POA: Diagnosis not present

## 2023-04-13 DIAGNOSIS — J301 Allergic rhinitis due to pollen: Secondary | ICD-10-CM | POA: Diagnosis not present

## 2023-04-13 DIAGNOSIS — M9905 Segmental and somatic dysfunction of pelvic region: Secondary | ICD-10-CM | POA: Diagnosis not present

## 2023-04-13 DIAGNOSIS — M5136 Other intervertebral disc degeneration, lumbar region: Secondary | ICD-10-CM | POA: Diagnosis not present

## 2023-04-19 DIAGNOSIS — M5136 Other intervertebral disc degeneration, lumbar region: Secondary | ICD-10-CM | POA: Diagnosis not present

## 2023-04-19 DIAGNOSIS — M9903 Segmental and somatic dysfunction of lumbar region: Secondary | ICD-10-CM | POA: Diagnosis not present

## 2023-04-19 DIAGNOSIS — J3089 Other allergic rhinitis: Secondary | ICD-10-CM | POA: Diagnosis not present

## 2023-04-19 DIAGNOSIS — J301 Allergic rhinitis due to pollen: Secondary | ICD-10-CM | POA: Diagnosis not present

## 2023-04-19 DIAGNOSIS — M9904 Segmental and somatic dysfunction of sacral region: Secondary | ICD-10-CM | POA: Diagnosis not present

## 2023-04-19 DIAGNOSIS — M9905 Segmental and somatic dysfunction of pelvic region: Secondary | ICD-10-CM | POA: Diagnosis not present

## 2023-04-19 DIAGNOSIS — J3081 Allergic rhinitis due to animal (cat) (dog) hair and dander: Secondary | ICD-10-CM | POA: Diagnosis not present

## 2023-04-26 DIAGNOSIS — J3081 Allergic rhinitis due to animal (cat) (dog) hair and dander: Secondary | ICD-10-CM | POA: Diagnosis not present

## 2023-04-26 DIAGNOSIS — J301 Allergic rhinitis due to pollen: Secondary | ICD-10-CM | POA: Diagnosis not present

## 2023-04-26 DIAGNOSIS — J3089 Other allergic rhinitis: Secondary | ICD-10-CM | POA: Diagnosis not present

## 2023-05-03 DIAGNOSIS — J301 Allergic rhinitis due to pollen: Secondary | ICD-10-CM | POA: Diagnosis not present

## 2023-05-03 DIAGNOSIS — J3081 Allergic rhinitis due to animal (cat) (dog) hair and dander: Secondary | ICD-10-CM | POA: Diagnosis not present

## 2023-05-03 DIAGNOSIS — J3089 Other allergic rhinitis: Secondary | ICD-10-CM | POA: Diagnosis not present

## 2023-05-10 DIAGNOSIS — J3089 Other allergic rhinitis: Secondary | ICD-10-CM | POA: Diagnosis not present

## 2023-05-10 DIAGNOSIS — J3081 Allergic rhinitis due to animal (cat) (dog) hair and dander: Secondary | ICD-10-CM | POA: Diagnosis not present

## 2023-05-10 DIAGNOSIS — J301 Allergic rhinitis due to pollen: Secondary | ICD-10-CM | POA: Diagnosis not present

## 2023-05-16 DIAGNOSIS — M5031 Other cervical disc degeneration,  high cervical region: Secondary | ICD-10-CM | POA: Diagnosis not present

## 2023-05-16 DIAGNOSIS — M9901 Segmental and somatic dysfunction of cervical region: Secondary | ICD-10-CM | POA: Diagnosis not present

## 2023-05-17 DIAGNOSIS — J3081 Allergic rhinitis due to animal (cat) (dog) hair and dander: Secondary | ICD-10-CM | POA: Diagnosis not present

## 2023-05-17 DIAGNOSIS — J3089 Other allergic rhinitis: Secondary | ICD-10-CM | POA: Diagnosis not present

## 2023-05-17 DIAGNOSIS — J301 Allergic rhinitis due to pollen: Secondary | ICD-10-CM | POA: Diagnosis not present

## 2023-05-24 DIAGNOSIS — J301 Allergic rhinitis due to pollen: Secondary | ICD-10-CM | POA: Diagnosis not present

## 2023-05-24 DIAGNOSIS — J3089 Other allergic rhinitis: Secondary | ICD-10-CM | POA: Diagnosis not present

## 2023-05-24 DIAGNOSIS — J3081 Allergic rhinitis due to animal (cat) (dog) hair and dander: Secondary | ICD-10-CM | POA: Diagnosis not present

## 2023-05-29 DIAGNOSIS — M9901 Segmental and somatic dysfunction of cervical region: Secondary | ICD-10-CM | POA: Diagnosis not present

## 2023-05-29 DIAGNOSIS — M5031 Other cervical disc degeneration,  high cervical region: Secondary | ICD-10-CM | POA: Diagnosis not present

## 2023-05-31 DIAGNOSIS — J301 Allergic rhinitis due to pollen: Secondary | ICD-10-CM | POA: Diagnosis not present

## 2023-05-31 DIAGNOSIS — J3081 Allergic rhinitis due to animal (cat) (dog) hair and dander: Secondary | ICD-10-CM | POA: Diagnosis not present

## 2023-05-31 DIAGNOSIS — J3089 Other allergic rhinitis: Secondary | ICD-10-CM | POA: Diagnosis not present

## 2023-06-07 DIAGNOSIS — J301 Allergic rhinitis due to pollen: Secondary | ICD-10-CM | POA: Diagnosis not present

## 2023-06-07 DIAGNOSIS — J3089 Other allergic rhinitis: Secondary | ICD-10-CM | POA: Diagnosis not present

## 2023-06-07 DIAGNOSIS — J3081 Allergic rhinitis due to animal (cat) (dog) hair and dander: Secondary | ICD-10-CM | POA: Diagnosis not present

## 2023-06-12 DIAGNOSIS — Z6822 Body mass index (BMI) 22.0-22.9, adult: Secondary | ICD-10-CM | POA: Diagnosis not present

## 2023-06-12 DIAGNOSIS — Z03818 Encounter for observation for suspected exposure to other biological agents ruled out: Secondary | ICD-10-CM | POA: Diagnosis not present

## 2023-06-12 DIAGNOSIS — J029 Acute pharyngitis, unspecified: Secondary | ICD-10-CM | POA: Diagnosis not present

## 2023-06-19 DIAGNOSIS — M5031 Other cervical disc degeneration,  high cervical region: Secondary | ICD-10-CM | POA: Diagnosis not present

## 2023-06-19 DIAGNOSIS — M9901 Segmental and somatic dysfunction of cervical region: Secondary | ICD-10-CM | POA: Diagnosis not present

## 2023-06-21 DIAGNOSIS — J301 Allergic rhinitis due to pollen: Secondary | ICD-10-CM | POA: Diagnosis not present

## 2023-06-21 DIAGNOSIS — J3089 Other allergic rhinitis: Secondary | ICD-10-CM | POA: Diagnosis not present

## 2023-06-28 DIAGNOSIS — J301 Allergic rhinitis due to pollen: Secondary | ICD-10-CM | POA: Diagnosis not present

## 2023-06-28 DIAGNOSIS — J3081 Allergic rhinitis due to animal (cat) (dog) hair and dander: Secondary | ICD-10-CM | POA: Diagnosis not present

## 2023-06-28 DIAGNOSIS — J3089 Other allergic rhinitis: Secondary | ICD-10-CM | POA: Diagnosis not present

## 2023-06-29 DIAGNOSIS — M5031 Other cervical disc degeneration,  high cervical region: Secondary | ICD-10-CM | POA: Diagnosis not present

## 2023-06-29 DIAGNOSIS — M9901 Segmental and somatic dysfunction of cervical region: Secondary | ICD-10-CM | POA: Diagnosis not present

## 2023-07-03 DIAGNOSIS — M9901 Segmental and somatic dysfunction of cervical region: Secondary | ICD-10-CM | POA: Diagnosis not present

## 2023-07-03 DIAGNOSIS — M5031 Other cervical disc degeneration,  high cervical region: Secondary | ICD-10-CM | POA: Diagnosis not present

## 2023-07-05 DIAGNOSIS — J3089 Other allergic rhinitis: Secondary | ICD-10-CM | POA: Diagnosis not present

## 2023-07-05 DIAGNOSIS — J3081 Allergic rhinitis due to animal (cat) (dog) hair and dander: Secondary | ICD-10-CM | POA: Diagnosis not present

## 2023-07-05 DIAGNOSIS — J301 Allergic rhinitis due to pollen: Secondary | ICD-10-CM | POA: Diagnosis not present

## 2023-07-12 DIAGNOSIS — M9901 Segmental and somatic dysfunction of cervical region: Secondary | ICD-10-CM | POA: Diagnosis not present

## 2023-07-12 DIAGNOSIS — J3081 Allergic rhinitis due to animal (cat) (dog) hair and dander: Secondary | ICD-10-CM | POA: Diagnosis not present

## 2023-07-12 DIAGNOSIS — M5031 Other cervical disc degeneration,  high cervical region: Secondary | ICD-10-CM | POA: Diagnosis not present

## 2023-07-12 DIAGNOSIS — J301 Allergic rhinitis due to pollen: Secondary | ICD-10-CM | POA: Diagnosis not present

## 2023-07-12 DIAGNOSIS — J3089 Other allergic rhinitis: Secondary | ICD-10-CM | POA: Diagnosis not present

## 2023-07-17 ENCOUNTER — Other Ambulatory Visit: Payer: Self-pay | Admitting: Obstetrics and Gynecology

## 2023-07-17 DIAGNOSIS — Z1231 Encounter for screening mammogram for malignant neoplasm of breast: Secondary | ICD-10-CM

## 2023-07-19 DIAGNOSIS — J3089 Other allergic rhinitis: Secondary | ICD-10-CM | POA: Diagnosis not present

## 2023-07-19 DIAGNOSIS — J301 Allergic rhinitis due to pollen: Secondary | ICD-10-CM | POA: Diagnosis not present

## 2023-07-19 DIAGNOSIS — M5031 Other cervical disc degeneration,  high cervical region: Secondary | ICD-10-CM | POA: Diagnosis not present

## 2023-07-19 DIAGNOSIS — J3081 Allergic rhinitis due to animal (cat) (dog) hair and dander: Secondary | ICD-10-CM | POA: Diagnosis not present

## 2023-07-19 DIAGNOSIS — M9901 Segmental and somatic dysfunction of cervical region: Secondary | ICD-10-CM | POA: Diagnosis not present

## 2023-07-27 DIAGNOSIS — J3089 Other allergic rhinitis: Secondary | ICD-10-CM | POA: Diagnosis not present

## 2023-07-27 DIAGNOSIS — J3081 Allergic rhinitis due to animal (cat) (dog) hair and dander: Secondary | ICD-10-CM | POA: Diagnosis not present

## 2023-07-27 DIAGNOSIS — J301 Allergic rhinitis due to pollen: Secondary | ICD-10-CM | POA: Diagnosis not present

## 2023-08-03 DIAGNOSIS — J3081 Allergic rhinitis due to animal (cat) (dog) hair and dander: Secondary | ICD-10-CM | POA: Diagnosis not present

## 2023-08-03 DIAGNOSIS — J3089 Other allergic rhinitis: Secondary | ICD-10-CM | POA: Diagnosis not present

## 2023-08-03 DIAGNOSIS — J301 Allergic rhinitis due to pollen: Secondary | ICD-10-CM | POA: Diagnosis not present

## 2023-08-09 DIAGNOSIS — M5031 Other cervical disc degeneration,  high cervical region: Secondary | ICD-10-CM | POA: Diagnosis not present

## 2023-08-09 DIAGNOSIS — M9901 Segmental and somatic dysfunction of cervical region: Secondary | ICD-10-CM | POA: Diagnosis not present

## 2023-08-10 DIAGNOSIS — J3089 Other allergic rhinitis: Secondary | ICD-10-CM | POA: Diagnosis not present

## 2023-08-10 DIAGNOSIS — J3081 Allergic rhinitis due to animal (cat) (dog) hair and dander: Secondary | ICD-10-CM | POA: Diagnosis not present

## 2023-08-10 DIAGNOSIS — J301 Allergic rhinitis due to pollen: Secondary | ICD-10-CM | POA: Diagnosis not present

## 2023-08-17 DIAGNOSIS — J3089 Other allergic rhinitis: Secondary | ICD-10-CM | POA: Diagnosis not present

## 2023-08-17 DIAGNOSIS — J301 Allergic rhinitis due to pollen: Secondary | ICD-10-CM | POA: Diagnosis not present

## 2023-08-17 DIAGNOSIS — L720 Epidermal cyst: Secondary | ICD-10-CM | POA: Diagnosis not present

## 2023-08-17 DIAGNOSIS — J3081 Allergic rhinitis due to animal (cat) (dog) hair and dander: Secondary | ICD-10-CM | POA: Diagnosis not present

## 2023-08-17 DIAGNOSIS — L920 Granuloma annulare: Secondary | ICD-10-CM | POA: Diagnosis not present

## 2023-08-17 DIAGNOSIS — L245 Irritant contact dermatitis due to other chemical products: Secondary | ICD-10-CM | POA: Diagnosis not present

## 2023-08-20 NOTE — Progress Notes (Signed)
HPI female former smoker followed for chronic insomnia since fall with hand injury/reflex sympathetic dystrophy., Complicated by allergic rhinitis (Dr. [place] Callas), GERD Says she failed trazodone and Lunesta. She is afraid of what she has heard about Ambien side effects/sleepwalking her zaleplon "relaxes her" so she can go to sleep but often lets her wake during the night.   ----------------------------------------------------------------------------------. .  08/19/22-  Covid vax- 3 Phizer Flu vax-hadury/refle She is doing fairly well with sleep now and we will refill clonazepam.  77 year old female former smoker followed for chronic insomnia since fall with hand injx sympathetic dystrophy., Complicated by allergic rhinitis (Dr. [place] Callas), GERD, HTN,  Med Hx: Failed trazodone, Doxepin, Benadryl, temazepam, Lunesta, zaleplon,  Uncomfortable with reported Ambien side effects/sleepwalking.  Zaleplon did not last long enough -Clonazepam 0.5 1-3 at hsShe has multiple things on her mind and frets a lot.  Using earplugs and a sound machine to help control noise at home.  08/22/23- 77 year old female former smoker followed for chronic insomnia since fall with hand injx sympathetic dystrophy., Complicated by allergic rhinitis (Dr. [place] Callas), GERD, HTN,  Med Hx: Failed trazodone, Doxepin, Benadryl, temazepam, Lunesta, zaleplon,  Uncomfortable with reported Ambien side effects/sleepwalking.  Zaleplon did not last long enough -Clonazepam 0.5 1-3 at hs Sleeping fairly well with clonazepam after trying several others. Had pneumonia Rx'd as outpatient in Spring. Discussed vaccines. Rhinitis managed by her Allergist- seasonal postnasal drip.  ROS-see HPI + = positive Constitutional:   No-   weight loss, night sweats, fevers, chills, fatigue, lassitude. HEENT:   + headaches, no-difficulty swallowing, tooth/dental problems, sore throat,       No-  sneezing, itching, ear ache, nasal congestion, post nasal drip,  CV:   No-   chest pain, orthopnea, PND, +swelling in lower extremities, anasarca,  dizziness, palpitations Resp: No-   shortness of breath with exertion or at rest.              No-   productive cough,  + non-productive cough,  No- coughing up of blood.              No-   change in color of mucus.  No- wheezing.   Skin: No-   rash or lesions. GI:  No-   heartburn, indigestion, abdominal pain, nausea, vomiting,  GU:  MS:  No-   joint pain or swelling.   Neuro-     nothing unusual Psych:  No- change in mood or affect. No depression or+ anxiety.  No memory loss.  OBJ- Physical Exam General- Alert, Oriented, Affect-appropriate/ somewhat anxious personlity, Distress- none acute. Appears well.  Skin- rash-none, lesions- none, excoriation- none Lymphadenopathy- none Head- atraumatic            Eyes- Gross vision intact, PERRLA, conjunctivae and secretions clear            Ears- Hearing, canals-normal            Nose- Clear, no-Septal dev, mucus, polyps, erosion, perforation             Throat- Mallampati II , mucosa clear , drainage- none, tonsils- atrophic Neck- flexible , trachea midline, no stridor , thyroid nl, carotid no bruit Chest - symmetrical excursion , unlabored           Heart/CV- RRR , no murmur , no gallop  , no rub, nl s1 s2                           -  JVD- none , edema- none, stasis changes- none, varices- none           Lung- clear to P&A, wheeze- none, cough- none , dullness-none, rub- none           Chest wall-  Abd-  Br/ Gen/ Rectal- Not done, not indicated Extrem- cyanosis- none, clubbing, none, atrophy- none, strength- nl Neuro- grossly intact to observation

## 2023-08-22 ENCOUNTER — Encounter: Payer: Self-pay | Admitting: Internal Medicine

## 2023-08-22 ENCOUNTER — Ambulatory Visit: Payer: Medicare HMO | Admitting: Internal Medicine

## 2023-08-22 VITALS — BP 149/66 | HR 90 | Ht 65.5 in | Wt 143.6 lb

## 2023-08-22 DIAGNOSIS — M9901 Segmental and somatic dysfunction of cervical region: Secondary | ICD-10-CM | POA: Diagnosis not present

## 2023-08-22 DIAGNOSIS — M5031 Other cervical disc degeneration,  high cervical region: Secondary | ICD-10-CM | POA: Diagnosis not present

## 2023-08-22 DIAGNOSIS — F5104 Psychophysiologic insomnia: Secondary | ICD-10-CM | POA: Diagnosis not present

## 2023-08-22 DIAGNOSIS — J3089 Other allergic rhinitis: Secondary | ICD-10-CM

## 2023-08-22 DIAGNOSIS — J302 Other seasonal allergic rhinitis: Secondary | ICD-10-CM | POA: Diagnosis not present

## 2023-08-22 NOTE — Patient Instructions (Signed)
Glad clonazepam helps. Let us know when you need a refill.  We talked about trying Zero- Sugar Archie Balboa Rancher hard candies ([employer] and [employer])

## 2023-08-23 NOTE — Progress Notes (Signed)
HPI: Follow-up coronary calcification, hypertension and hyperlipidemia.  Monitor September 2023 showed sinus rhythm with occasional PACs, brief runs of PAT, occasional PVC and rare couplet.  Echocardiogram September 2023 showed normal LV function, mild mitral regurgitation.  Calcium score September 2023 650 which was 86 percentile. Since last seen, she has dyspnea with more vigorous activities but not routine activities.  No orthopnea, PND, pedal edema, chest pain or syncope.  Current Outpatient Medications  Medication Sig Dispense Refill   albuterol (PROVENTIL HFA;VENTOLIN HFA) 108 (90 Base) MCG/ACT inhaler Inhale 1-2 puffs into the lungs every 6 (six) hours as needed for wheezing or shortness of breath.     Ascorbic Acid (VITAMIN C ER PO) Take by mouth.     aspirin EC 81 MG tablet Take 1 tablet (81 mg total) by mouth daily. Swallow whole. 90 tablet 3   azelastine (OPTIVAR) 0.05 % ophthalmic solution INSTILL 1 DROP TO AFFECTED EYE(S) TWICE A DAY  2   b complex vitamins capsule Take 1 capsule by mouth daily.     betamethasone valerate ointment (VALISONE) 0.1 % betamethasone valerate 0.1 % topical ointment APPLY SPARINGLY TO AFFECTED AREA TWICE A DAY     Cholecalciferol ([facility] VITAMIN D3 PO) Take 2,000 mcg by mouth.     clobetasol cream (TEMOVATE) 0.05 % Apply topically.     clonazePAM (KLONOPIN) 0.5 MG tablet TAKE 1-3 TABLETS BY MOUTH DAILY FOR SLEEP IF NEEDED 90 tablet 5   Coenzyme Q10 (COQ10) 200 MG CAPS Take 1 capsule by mouth daily.     Cyanocobalamin (VITAMIN B-12) 500 MCG SUBL Place 1 tablet under the tongue daily after lunch.     EPIPEN 2-PAK 0.3 MG/0.3ML DEVI      fluticasone (FLONASE) 50 MCG/ACT nasal spray Place 2 sprays into both nostrils daily.     MAGNESIUM GLYCINATE PO Take 2 tablets by mouth.     Multiple Vitamins-Minerals (ANTIOXIDANT PO) Take 2 capsules by mouth daily.     OMEGA 3 1000 MG CAPS Take 1 capsule by mouth daily.     Probiotic Product (PROBIOTIC DAILY PO)  Probiotic     raloxifene (EVISTA) 60 MG tablet Take 60 mg by mouth daily.     rosuvastatin (CRESTOR) 20 MG tablet Take 1 tablet (20 mg total) by mouth daily. 90 tablet 3   VITAMIN K PO Take by mouth.     ergocalciferol (DRISDOL) 8000 UNIT/ML drops 4 drops qd sublingual (Patient not taking: Reported on 09/05/2023)     mometasone (NASONEX) 50 MCG/ACT nasal spray 1-2 SPRAYS EACH NOSTRIL NASALLY ONCE A DAY 30 DAYS (Patient not taking: Reported on 09/05/2023)     triamcinolone (NASACORT ALLERGY 24HR) 55 MCG/ACT AERO nasal inhaler 1 spray in each nostril (Patient not taking: Reported on 09/05/2023)     No current facility-administered medications for this visit.     Past Medical History:  Diagnosis Date   Allergy    Asthma    Environmental allergies    GERD (gastroesophageal reflux disease)    Hyperlipidemia    Hypertension    Murmur    Nervous disorder     Past Surgical History:  Procedure Laterality Date   ABDOMINAL HYSTERECTOMY     TONSILLECTOMY      Social History   Socioeconomic History   Marital status: Married    Spouse name: Not on file   Number of children: 3   Years of education: Not on file   Highest education level: Not on file  Occupational  History   Occupation: retired  Tobacco Use   Smoking status: Former    Types: Cigarettes   Smokeless tobacco: Never  Substance and Sexual Activity   Alcohol use: Yes    Comment: rare-wine   Drug use: No   Sexual activity: Not on file  Other Topics Concern   Not on file  Social History Narrative   Not on file   Social Determinants of Health   Financial Resource Strain: Not on file  Food Insecurity: Not on file  Transportation Needs: Not on file  Physical Activity: Not on file  Stress: Not on file  Social Connections: Not on file  Intimate Partner Violence: Not on file    Family History  Problem Relation Age of Onset   Lung cancer Mother    Asthma Mother    Allergies Mother    Cancer - Other Other     Ovarian cancer Other    Breast cancer Neg Hx     ROS: no fevers or chills, productive cough, hemoptysis, dysphasia, odynophagia, melena, hematochezia, dysuria, hematuria, rash, seizure activity, orthopnea, PND, pedal edema, claudication. Remaining systems are negative.  Physical Exam: Well-developed well-nourished in no acute distress.  Skin is warm and dry.  HEENT is normal.  Neck is supple.  Chest is clear to auscultation with normal expansion.  Cardiovascular exam is regular rate and rhythm.  Abdominal exam nontender or distended. No masses palpated. Extremities show no edema. neuro grossly intact  EKG Interpretation Date/Time:  Tuesday September 05 2023 10:04:05 EDT Ventricular Rate:  77 PR Interval:  180 QRS Duration:  92 QT Interval:  394 QTC Calculation: 445 R Axis:   -53  Text Interpretation: Normal sinus rhythm Incomplete right bundle branch block Left anterior fascicular block Confirmed by Olga Millers ([id number]) on 09/05/2023 10:07:40 AM    A/P  1 coronary calcification-patient denies chest pain.  She declines aspirin and statin.  2 hyperlipidemia-she declines lipid-lowering medications though will consider.  She will contact us if she is agreeable.  We discussed the benefits of lipid lowering in patients with coronary disease.  3 hypertension-blood pressure elevated; however she follows this at home and it is typically controlled.  Will follow.  4 palpitations-previous monitor showed no significant arrhythmias.  Note LV function is normal.  Olga Millers, MD

## 2023-08-24 DIAGNOSIS — J3081 Allergic rhinitis due to animal (cat) (dog) hair and dander: Secondary | ICD-10-CM | POA: Diagnosis not present

## 2023-08-24 DIAGNOSIS — J3089 Other allergic rhinitis: Secondary | ICD-10-CM | POA: Diagnosis not present

## 2023-08-24 DIAGNOSIS — J301 Allergic rhinitis due to pollen: Secondary | ICD-10-CM | POA: Diagnosis not present

## 2023-08-28 DIAGNOSIS — M5031 Other cervical disc degeneration,  high cervical region: Secondary | ICD-10-CM | POA: Diagnosis not present

## 2023-08-28 DIAGNOSIS — M9901 Segmental and somatic dysfunction of cervical region: Secondary | ICD-10-CM | POA: Diagnosis not present

## 2023-08-29 DIAGNOSIS — K219 Gastro-esophageal reflux disease without esophagitis: Secondary | ICD-10-CM | POA: Diagnosis not present

## 2023-08-29 DIAGNOSIS — J4520 Mild intermittent asthma, uncomplicated: Secondary | ICD-10-CM | POA: Diagnosis not present

## 2023-08-29 DIAGNOSIS — J3089 Other allergic rhinitis: Secondary | ICD-10-CM | POA: Diagnosis not present

## 2023-08-31 DIAGNOSIS — J301 Allergic rhinitis due to pollen: Secondary | ICD-10-CM | POA: Diagnosis not present

## 2023-08-31 DIAGNOSIS — J3089 Other allergic rhinitis: Secondary | ICD-10-CM | POA: Diagnosis not present

## 2023-08-31 DIAGNOSIS — J3081 Allergic rhinitis due to animal (cat) (dog) hair and dander: Secondary | ICD-10-CM | POA: Diagnosis not present

## 2023-09-05 ENCOUNTER — Encounter: Payer: Self-pay | Admitting: Cardiology

## 2023-09-05 ENCOUNTER — Ambulatory Visit: Payer: Medicare HMO | Attending: Cardiology | Admitting: Cardiology

## 2023-09-05 VITALS — BP 146/70 | HR 77 | Ht 65.0 in | Wt 141.0 lb

## 2023-09-05 DIAGNOSIS — I10 Essential (primary) hypertension: Secondary | ICD-10-CM | POA: Diagnosis not present

## 2023-09-05 DIAGNOSIS — I2510 Atherosclerotic heart disease of native coronary artery without angina pectoris: Secondary | ICD-10-CM

## 2023-09-05 DIAGNOSIS — E7801 Familial hypercholesterolemia: Secondary | ICD-10-CM

## 2023-09-05 DIAGNOSIS — R002 Palpitations: Secondary | ICD-10-CM | POA: Diagnosis not present

## 2023-09-05 NOTE — Patient Instructions (Signed)

## 2023-09-07 DIAGNOSIS — J3089 Other allergic rhinitis: Secondary | ICD-10-CM | POA: Diagnosis not present

## 2023-09-07 DIAGNOSIS — J3081 Allergic rhinitis due to animal (cat) (dog) hair and dander: Secondary | ICD-10-CM | POA: Diagnosis not present

## 2023-09-07 DIAGNOSIS — J301 Allergic rhinitis due to pollen: Secondary | ICD-10-CM | POA: Diagnosis not present

## 2023-09-10 NOTE — Assessment & Plan Note (Signed)
Mild seasonal increase. Managed by her Allergist.

## 2023-09-10 NOTE — Assessment & Plan Note (Signed)
Less anxious about it now. Clonazepam has helped Plan- Continue attention to sleep habits, clonazepam as needed

## 2023-09-13 DIAGNOSIS — J3089 Other allergic rhinitis: Secondary | ICD-10-CM | POA: Diagnosis not present

## 2023-09-13 DIAGNOSIS — M5031 Other cervical disc degeneration,  high cervical region: Secondary | ICD-10-CM | POA: Diagnosis not present

## 2023-09-13 DIAGNOSIS — J3081 Allergic rhinitis due to animal (cat) (dog) hair and dander: Secondary | ICD-10-CM | POA: Diagnosis not present

## 2023-09-13 DIAGNOSIS — M9901 Segmental and somatic dysfunction of cervical region: Secondary | ICD-10-CM | POA: Diagnosis not present

## 2023-09-13 DIAGNOSIS — J301 Allergic rhinitis due to pollen: Secondary | ICD-10-CM | POA: Diagnosis not present

## 2023-09-15 DIAGNOSIS — J301 Allergic rhinitis due to pollen: Secondary | ICD-10-CM | POA: Diagnosis not present

## 2023-09-20 DIAGNOSIS — M9901 Segmental and somatic dysfunction of cervical region: Secondary | ICD-10-CM | POA: Diagnosis not present

## 2023-09-20 DIAGNOSIS — J3089 Other allergic rhinitis: Secondary | ICD-10-CM | POA: Diagnosis not present

## 2023-09-20 DIAGNOSIS — J301 Allergic rhinitis due to pollen: Secondary | ICD-10-CM | POA: Diagnosis not present

## 2023-09-20 DIAGNOSIS — M5031 Other cervical disc degeneration,  high cervical region: Secondary | ICD-10-CM | POA: Diagnosis not present

## 2023-09-20 DIAGNOSIS — J3081 Allergic rhinitis due to animal (cat) (dog) hair and dander: Secondary | ICD-10-CM | POA: Diagnosis not present

## 2023-09-25 DIAGNOSIS — M9901 Segmental and somatic dysfunction of cervical region: Secondary | ICD-10-CM | POA: Diagnosis not present

## 2023-09-25 DIAGNOSIS — M5031 Other cervical disc degeneration,  high cervical region: Secondary | ICD-10-CM | POA: Diagnosis not present

## 2023-09-26 DIAGNOSIS — J3089 Other allergic rhinitis: Secondary | ICD-10-CM | POA: Diagnosis not present

## 2023-09-26 DIAGNOSIS — J301 Allergic rhinitis due to pollen: Secondary | ICD-10-CM | POA: Diagnosis not present

## 2023-09-26 DIAGNOSIS — J3081 Allergic rhinitis due to animal (cat) (dog) hair and dander: Secondary | ICD-10-CM | POA: Diagnosis not present

## 2023-09-27 DIAGNOSIS — L309 Dermatitis, unspecified: Secondary | ICD-10-CM | POA: Diagnosis not present

## 2023-09-28 DIAGNOSIS — H31003 Unspecified chorioretinal scars, bilateral: Secondary | ICD-10-CM | POA: Diagnosis not present

## 2023-09-28 DIAGNOSIS — H40021 Open angle with borderline findings, high risk, right eye: Secondary | ICD-10-CM | POA: Diagnosis not present

## 2023-09-28 DIAGNOSIS — H2513 Age-related nuclear cataract, bilateral: Secondary | ICD-10-CM | POA: Diagnosis not present

## 2023-09-28 DIAGNOSIS — H532 Diplopia: Secondary | ICD-10-CM | POA: Diagnosis not present

## 2023-10-02 DIAGNOSIS — J3089 Other allergic rhinitis: Secondary | ICD-10-CM | POA: Diagnosis not present

## 2023-10-02 DIAGNOSIS — J301 Allergic rhinitis due to pollen: Secondary | ICD-10-CM | POA: Diagnosis not present

## 2023-10-02 DIAGNOSIS — J3081 Allergic rhinitis due to animal (cat) (dog) hair and dander: Secondary | ICD-10-CM | POA: Diagnosis not present

## 2023-10-09 DIAGNOSIS — J3089 Other allergic rhinitis: Secondary | ICD-10-CM | POA: Diagnosis not present

## 2023-10-09 DIAGNOSIS — J3081 Allergic rhinitis due to animal (cat) (dog) hair and dander: Secondary | ICD-10-CM | POA: Diagnosis not present

## 2023-10-09 DIAGNOSIS — J301 Allergic rhinitis due to pollen: Secondary | ICD-10-CM | POA: Diagnosis not present

## 2023-10-10 DIAGNOSIS — Z6824 Body mass index (BMI) 24.0-24.9, adult: Secondary | ICD-10-CM | POA: Diagnosis not present

## 2023-10-10 DIAGNOSIS — M810 Age-related osteoporosis without current pathological fracture: Secondary | ICD-10-CM | POA: Diagnosis not present

## 2023-10-10 DIAGNOSIS — N952 Postmenopausal atrophic vaginitis: Secondary | ICD-10-CM | POA: Diagnosis not present

## 2023-10-10 DIAGNOSIS — Z01419 Encounter for gynecological examination (general) (routine) without abnormal findings: Secondary | ICD-10-CM | POA: Diagnosis not present

## 2023-10-10 DIAGNOSIS — L900 Lichen sclerosus et atrophicus: Secondary | ICD-10-CM | POA: Diagnosis not present

## 2023-10-11 ENCOUNTER — Other Ambulatory Visit: Payer: Self-pay | Admitting: Internal Medicine

## 2023-10-11 NOTE — Telephone Encounter (Signed)
Clonazepam refilled 

## 2023-10-11 NOTE — Telephone Encounter (Signed)
Refill request sent to Dr. Maple Hudson to advise.  Last refill-01/20/23 #90, 5 refills

## 2023-10-12 DIAGNOSIS — H532 Diplopia: Secondary | ICD-10-CM | POA: Diagnosis not present

## 2023-10-13 ENCOUNTER — Ambulatory Visit: Payer: Medicare HMO | Admitting: Cardiology

## 2023-10-16 DIAGNOSIS — J3081 Allergic rhinitis due to animal (cat) (dog) hair and dander: Secondary | ICD-10-CM | POA: Diagnosis not present

## 2023-10-16 DIAGNOSIS — M5031 Other cervical disc degeneration,  high cervical region: Secondary | ICD-10-CM | POA: Diagnosis not present

## 2023-10-16 DIAGNOSIS — J301 Allergic rhinitis due to pollen: Secondary | ICD-10-CM | POA: Diagnosis not present

## 2023-10-16 DIAGNOSIS — J3089 Other allergic rhinitis: Secondary | ICD-10-CM | POA: Diagnosis not present

## 2023-10-16 DIAGNOSIS — M9901 Segmental and somatic dysfunction of cervical region: Secondary | ICD-10-CM | POA: Diagnosis not present

## 2023-10-17 DIAGNOSIS — M5031 Other cervical disc degeneration,  high cervical region: Secondary | ICD-10-CM | POA: Diagnosis not present

## 2023-10-17 DIAGNOSIS — M9901 Segmental and somatic dysfunction of cervical region: Secondary | ICD-10-CM | POA: Diagnosis not present

## 2023-10-18 DIAGNOSIS — M5031 Other cervical disc degeneration,  high cervical region: Secondary | ICD-10-CM | POA: Diagnosis not present

## 2023-10-18 DIAGNOSIS — M9901 Segmental and somatic dysfunction of cervical region: Secondary | ICD-10-CM | POA: Diagnosis not present

## 2023-10-19 DIAGNOSIS — M9901 Segmental and somatic dysfunction of cervical region: Secondary | ICD-10-CM | POA: Diagnosis not present

## 2023-10-19 DIAGNOSIS — M5031 Other cervical disc degeneration,  high cervical region: Secondary | ICD-10-CM | POA: Diagnosis not present

## 2023-10-23 DIAGNOSIS — M5031 Other cervical disc degeneration,  high cervical region: Secondary | ICD-10-CM | POA: Diagnosis not present

## 2023-10-23 DIAGNOSIS — J3081 Allergic rhinitis due to animal (cat) (dog) hair and dander: Secondary | ICD-10-CM | POA: Diagnosis not present

## 2023-10-23 DIAGNOSIS — J3089 Other allergic rhinitis: Secondary | ICD-10-CM | POA: Diagnosis not present

## 2023-10-23 DIAGNOSIS — J301 Allergic rhinitis due to pollen: Secondary | ICD-10-CM | POA: Diagnosis not present

## 2023-10-23 DIAGNOSIS — M9901 Segmental and somatic dysfunction of cervical region: Secondary | ICD-10-CM | POA: Diagnosis not present

## 2023-10-25 DIAGNOSIS — M5031 Other cervical disc degeneration,  high cervical region: Secondary | ICD-10-CM | POA: Diagnosis not present

## 2023-10-25 DIAGNOSIS — M9901 Segmental and somatic dysfunction of cervical region: Secondary | ICD-10-CM | POA: Diagnosis not present

## 2023-10-30 DIAGNOSIS — J3089 Other allergic rhinitis: Secondary | ICD-10-CM | POA: Diagnosis not present

## 2023-10-30 DIAGNOSIS — J301 Allergic rhinitis due to pollen: Secondary | ICD-10-CM | POA: Diagnosis not present

## 2023-10-30 DIAGNOSIS — J3081 Allergic rhinitis due to animal (cat) (dog) hair and dander: Secondary | ICD-10-CM | POA: Diagnosis not present

## 2023-11-09 DIAGNOSIS — J3081 Allergic rhinitis due to animal (cat) (dog) hair and dander: Secondary | ICD-10-CM | POA: Diagnosis not present

## 2023-11-09 DIAGNOSIS — J301 Allergic rhinitis due to pollen: Secondary | ICD-10-CM | POA: Diagnosis not present

## 2023-11-09 DIAGNOSIS — J3089 Other allergic rhinitis: Secondary | ICD-10-CM | POA: Diagnosis not present

## 2023-11-14 DIAGNOSIS — J3081 Allergic rhinitis due to animal (cat) (dog) hair and dander: Secondary | ICD-10-CM | POA: Diagnosis not present

## 2023-11-14 DIAGNOSIS — J3089 Other allergic rhinitis: Secondary | ICD-10-CM | POA: Diagnosis not present

## 2023-11-14 DIAGNOSIS — J301 Allergic rhinitis due to pollen: Secondary | ICD-10-CM | POA: Diagnosis not present

## 2023-11-16 DIAGNOSIS — M9901 Segmental and somatic dysfunction of cervical region: Secondary | ICD-10-CM | POA: Diagnosis not present

## 2023-11-16 DIAGNOSIS — M5031 Other cervical disc degeneration,  high cervical region: Secondary | ICD-10-CM | POA: Diagnosis not present

## 2023-11-20 DIAGNOSIS — B070 Plantar wart: Secondary | ICD-10-CM | POA: Diagnosis not present

## 2023-11-20 DIAGNOSIS — Z6824 Body mass index (BMI) 24.0-24.9, adult: Secondary | ICD-10-CM | POA: Diagnosis not present

## 2023-11-21 DIAGNOSIS — J301 Allergic rhinitis due to pollen: Secondary | ICD-10-CM | POA: Diagnosis not present

## 2023-11-21 DIAGNOSIS — J3089 Other allergic rhinitis: Secondary | ICD-10-CM | POA: Diagnosis not present

## 2023-11-24 DIAGNOSIS — Z1389 Encounter for screening for other disorder: Secondary | ICD-10-CM | POA: Diagnosis not present

## 2023-11-24 DIAGNOSIS — E7800 Pure hypercholesterolemia, unspecified: Secondary | ICD-10-CM | POA: Diagnosis not present

## 2023-11-24 DIAGNOSIS — E559 Vitamin D deficiency, unspecified: Secondary | ICD-10-CM | POA: Diagnosis not present

## 2023-11-29 DIAGNOSIS — J3081 Allergic rhinitis due to animal (cat) (dog) hair and dander: Secondary | ICD-10-CM | POA: Diagnosis not present

## 2023-11-29 DIAGNOSIS — J3089 Other allergic rhinitis: Secondary | ICD-10-CM | POA: Diagnosis not present

## 2023-11-29 DIAGNOSIS — J301 Allergic rhinitis due to pollen: Secondary | ICD-10-CM | POA: Diagnosis not present

## 2023-12-01 DIAGNOSIS — M810 Age-related osteoporosis without current pathological fracture: Secondary | ICD-10-CM | POA: Diagnosis not present

## 2023-12-01 DIAGNOSIS — Z0000 Encounter for general adult medical examination without abnormal findings: Secondary | ICD-10-CM | POA: Diagnosis not present

## 2023-12-01 DIAGNOSIS — Z6824 Body mass index (BMI) 24.0-24.9, adult: Secondary | ICD-10-CM | POA: Diagnosis not present

## 2023-12-01 DIAGNOSIS — B070 Plantar wart: Secondary | ICD-10-CM | POA: Diagnosis not present

## 2023-12-05 DIAGNOSIS — J3089 Other allergic rhinitis: Secondary | ICD-10-CM | POA: Diagnosis not present

## 2023-12-05 DIAGNOSIS — J301 Allergic rhinitis due to pollen: Secondary | ICD-10-CM | POA: Diagnosis not present

## 2023-12-05 DIAGNOSIS — J3081 Allergic rhinitis due to animal (cat) (dog) hair and dander: Secondary | ICD-10-CM | POA: Diagnosis not present

## 2023-12-11 DIAGNOSIS — J3081 Allergic rhinitis due to animal (cat) (dog) hair and dander: Secondary | ICD-10-CM | POA: Diagnosis not present

## 2023-12-11 DIAGNOSIS — J301 Allergic rhinitis due to pollen: Secondary | ICD-10-CM | POA: Diagnosis not present

## 2023-12-11 DIAGNOSIS — M5031 Other cervical disc degeneration,  high cervical region: Secondary | ICD-10-CM | POA: Diagnosis not present

## 2023-12-11 DIAGNOSIS — M9901 Segmental and somatic dysfunction of cervical region: Secondary | ICD-10-CM | POA: Diagnosis not present

## 2023-12-11 DIAGNOSIS — C44629 Squamous cell carcinoma of skin of left upper limb, including shoulder: Secondary | ICD-10-CM | POA: Diagnosis not present

## 2023-12-11 DIAGNOSIS — J3089 Other allergic rhinitis: Secondary | ICD-10-CM | POA: Diagnosis not present

## 2023-12-12 ENCOUNTER — Ambulatory Visit
Admission: RE | Admit: 2023-12-12 | Discharge: 2023-12-12 | Disposition: A | Discharge status: Home / Self Care | Payer: Medicare HMO | Source: Ambulatory Visit | Attending: Obstetrics and Gynecology | Admitting: Obstetrics and Gynecology

## 2023-12-12 DIAGNOSIS — Z1231 Encounter for screening mammogram for malignant neoplasm of breast: Secondary | ICD-10-CM | POA: Diagnosis not present

## 2023-12-14 DIAGNOSIS — M9901 Segmental and somatic dysfunction of cervical region: Secondary | ICD-10-CM | POA: Diagnosis not present

## 2023-12-14 DIAGNOSIS — M5031 Other cervical disc degeneration,  high cervical region: Secondary | ICD-10-CM | POA: Diagnosis not present

## 2023-12-19 DIAGNOSIS — J3089 Other allergic rhinitis: Secondary | ICD-10-CM | POA: Diagnosis not present

## 2023-12-19 DIAGNOSIS — J3081 Allergic rhinitis due to animal (cat) (dog) hair and dander: Secondary | ICD-10-CM | POA: Diagnosis not present

## 2023-12-19 DIAGNOSIS — J301 Allergic rhinitis due to pollen: Secondary | ICD-10-CM | POA: Diagnosis not present

## 2023-12-20 DIAGNOSIS — J3089 Other allergic rhinitis: Secondary | ICD-10-CM | POA: Diagnosis not present

## 2023-12-25 DIAGNOSIS — J301 Allergic rhinitis due to pollen: Secondary | ICD-10-CM | POA: Diagnosis not present

## 2023-12-25 DIAGNOSIS — J3081 Allergic rhinitis due to animal (cat) (dog) hair and dander: Secondary | ICD-10-CM | POA: Diagnosis not present

## 2023-12-25 DIAGNOSIS — J3089 Other allergic rhinitis: Secondary | ICD-10-CM | POA: Diagnosis not present

## 2024-01-01 DIAGNOSIS — J301 Allergic rhinitis due to pollen: Secondary | ICD-10-CM | POA: Diagnosis not present

## 2024-01-01 DIAGNOSIS — J3081 Allergic rhinitis due to animal (cat) (dog) hair and dander: Secondary | ICD-10-CM | POA: Diagnosis not present

## 2024-01-01 DIAGNOSIS — J3089 Other allergic rhinitis: Secondary | ICD-10-CM | POA: Diagnosis not present

## 2024-01-08 DIAGNOSIS — M9901 Segmental and somatic dysfunction of cervical region: Secondary | ICD-10-CM | POA: Diagnosis not present

## 2024-01-08 DIAGNOSIS — J3081 Allergic rhinitis due to animal (cat) (dog) hair and dander: Secondary | ICD-10-CM | POA: Diagnosis not present

## 2024-01-08 DIAGNOSIS — M62838 Other muscle spasm: Secondary | ICD-10-CM | POA: Diagnosis not present

## 2024-01-08 DIAGNOSIS — J301 Allergic rhinitis due to pollen: Secondary | ICD-10-CM | POA: Diagnosis not present

## 2024-01-08 DIAGNOSIS — J3089 Other allergic rhinitis: Secondary | ICD-10-CM | POA: Diagnosis not present

## 2024-01-08 DIAGNOSIS — M5031 Other cervical disc degeneration,  high cervical region: Secondary | ICD-10-CM | POA: Diagnosis not present

## 2024-01-15 DIAGNOSIS — J3089 Other allergic rhinitis: Secondary | ICD-10-CM | POA: Diagnosis not present

## 2024-01-15 DIAGNOSIS — J301 Allergic rhinitis due to pollen: Secondary | ICD-10-CM | POA: Diagnosis not present

## 2024-01-15 DIAGNOSIS — J3081 Allergic rhinitis due to animal (cat) (dog) hair and dander: Secondary | ICD-10-CM | POA: Diagnosis not present

## 2024-01-23 DIAGNOSIS — J3089 Other allergic rhinitis: Secondary | ICD-10-CM | POA: Diagnosis not present

## 2024-01-23 DIAGNOSIS — J301 Allergic rhinitis due to pollen: Secondary | ICD-10-CM | POA: Diagnosis not present

## 2024-01-23 DIAGNOSIS — J3081 Allergic rhinitis due to animal (cat) (dog) hair and dander: Secondary | ICD-10-CM | POA: Diagnosis not present

## 2024-01-29 DIAGNOSIS — M9901 Segmental and somatic dysfunction of cervical region: Secondary | ICD-10-CM | POA: Diagnosis not present

## 2024-01-29 DIAGNOSIS — M62838 Other muscle spasm: Secondary | ICD-10-CM | POA: Diagnosis not present

## 2024-01-29 DIAGNOSIS — M5031 Other cervical disc degeneration,  high cervical region: Secondary | ICD-10-CM | POA: Diagnosis not present

## 2024-01-30 DIAGNOSIS — J301 Allergic rhinitis due to pollen: Secondary | ICD-10-CM | POA: Diagnosis not present

## 2024-01-30 DIAGNOSIS — J3089 Other allergic rhinitis: Secondary | ICD-10-CM | POA: Diagnosis not present

## 2024-01-30 DIAGNOSIS — J3081 Allergic rhinitis due to animal (cat) (dog) hair and dander: Secondary | ICD-10-CM | POA: Diagnosis not present

## 2024-02-05 DIAGNOSIS — M62838 Other muscle spasm: Secondary | ICD-10-CM | POA: Diagnosis not present

## 2024-02-05 DIAGNOSIS — M5031 Other cervical disc degeneration,  high cervical region: Secondary | ICD-10-CM | POA: Diagnosis not present

## 2024-02-05 DIAGNOSIS — M9901 Segmental and somatic dysfunction of cervical region: Secondary | ICD-10-CM | POA: Diagnosis not present

## 2024-02-06 DIAGNOSIS — J3081 Allergic rhinitis due to animal (cat) (dog) hair and dander: Secondary | ICD-10-CM | POA: Diagnosis not present

## 2024-02-06 DIAGNOSIS — J3089 Other allergic rhinitis: Secondary | ICD-10-CM | POA: Diagnosis not present

## 2024-02-06 DIAGNOSIS — J301 Allergic rhinitis due to pollen: Secondary | ICD-10-CM | POA: Diagnosis not present

## 2024-02-13 DIAGNOSIS — J3081 Allergic rhinitis due to animal (cat) (dog) hair and dander: Secondary | ICD-10-CM | POA: Diagnosis not present

## 2024-02-13 DIAGNOSIS — J301 Allergic rhinitis due to pollen: Secondary | ICD-10-CM | POA: Diagnosis not present

## 2024-02-13 DIAGNOSIS — J3089 Other allergic rhinitis: Secondary | ICD-10-CM | POA: Diagnosis not present

## 2024-02-27 DIAGNOSIS — M5031 Other cervical disc degeneration,  high cervical region: Secondary | ICD-10-CM | POA: Diagnosis not present

## 2024-02-27 DIAGNOSIS — J3081 Allergic rhinitis due to animal (cat) (dog) hair and dander: Secondary | ICD-10-CM | POA: Diagnosis not present

## 2024-02-27 DIAGNOSIS — J3089 Other allergic rhinitis: Secondary | ICD-10-CM | POA: Diagnosis not present

## 2024-02-27 DIAGNOSIS — J301 Allergic rhinitis due to pollen: Secondary | ICD-10-CM | POA: Diagnosis not present

## 2024-02-27 DIAGNOSIS — M62838 Other muscle spasm: Secondary | ICD-10-CM | POA: Diagnosis not present

## 2024-02-27 DIAGNOSIS — M9901 Segmental and somatic dysfunction of cervical region: Secondary | ICD-10-CM | POA: Diagnosis not present

## 2024-02-28 DIAGNOSIS — H2513 Age-related nuclear cataract, bilateral: Secondary | ICD-10-CM | POA: Diagnosis not present

## 2024-02-28 DIAGNOSIS — H52203 Unspecified astigmatism, bilateral: Secondary | ICD-10-CM | POA: Diagnosis not present

## 2024-02-28 DIAGNOSIS — H532 Diplopia: Secondary | ICD-10-CM | POA: Diagnosis not present

## 2024-02-28 DIAGNOSIS — D3131 Benign neoplasm of right choroid: Secondary | ICD-10-CM | POA: Diagnosis not present

## 2024-02-28 DIAGNOSIS — H5203 Hypermetropia, bilateral: Secondary | ICD-10-CM | POA: Diagnosis not present

## 2024-03-05 DIAGNOSIS — J3089 Other allergic rhinitis: Secondary | ICD-10-CM | POA: Diagnosis not present

## 2024-03-05 DIAGNOSIS — J3081 Allergic rhinitis due to animal (cat) (dog) hair and dander: Secondary | ICD-10-CM | POA: Diagnosis not present

## 2024-03-05 DIAGNOSIS — J301 Allergic rhinitis due to pollen: Secondary | ICD-10-CM | POA: Diagnosis not present

## 2024-03-12 DIAGNOSIS — J301 Allergic rhinitis due to pollen: Secondary | ICD-10-CM | POA: Diagnosis not present

## 2024-03-13 DIAGNOSIS — J3081 Allergic rhinitis due to animal (cat) (dog) hair and dander: Secondary | ICD-10-CM | POA: Diagnosis not present

## 2024-03-13 DIAGNOSIS — J3089 Other allergic rhinitis: Secondary | ICD-10-CM | POA: Diagnosis not present

## 2024-03-13 DIAGNOSIS — M62838 Other muscle spasm: Secondary | ICD-10-CM | POA: Diagnosis not present

## 2024-03-13 DIAGNOSIS — J301 Allergic rhinitis due to pollen: Secondary | ICD-10-CM | POA: Diagnosis not present

## 2024-03-13 DIAGNOSIS — M5031 Other cervical disc degeneration,  high cervical region: Secondary | ICD-10-CM | POA: Diagnosis not present

## 2024-03-13 DIAGNOSIS — M9901 Segmental and somatic dysfunction of cervical region: Secondary | ICD-10-CM | POA: Diagnosis not present

## 2024-03-26 DIAGNOSIS — J3089 Other allergic rhinitis: Secondary | ICD-10-CM | POA: Diagnosis not present

## 2024-03-26 DIAGNOSIS — J3081 Allergic rhinitis due to animal (cat) (dog) hair and dander: Secondary | ICD-10-CM | POA: Diagnosis not present

## 2024-03-26 DIAGNOSIS — J301 Allergic rhinitis due to pollen: Secondary | ICD-10-CM | POA: Diagnosis not present

## 2024-03-27 DIAGNOSIS — L84 Corns and callosities: Secondary | ICD-10-CM | POA: Diagnosis not present

## 2024-03-27 DIAGNOSIS — Z85828 Personal history of other malignant neoplasm of skin: Secondary | ICD-10-CM | POA: Diagnosis not present

## 2024-03-27 DIAGNOSIS — D2262 Melanocytic nevi of left upper limb, including shoulder: Secondary | ICD-10-CM | POA: Diagnosis not present

## 2024-03-27 DIAGNOSIS — L720 Epidermal cyst: Secondary | ICD-10-CM | POA: Diagnosis not present

## 2024-03-27 DIAGNOSIS — D1801 Hemangioma of skin and subcutaneous tissue: Secondary | ICD-10-CM | POA: Diagnosis not present

## 2024-03-27 DIAGNOSIS — D225 Melanocytic nevi of trunk: Secondary | ICD-10-CM | POA: Diagnosis not present

## 2024-03-27 DIAGNOSIS — L821 Other seborrheic keratosis: Secondary | ICD-10-CM | POA: Diagnosis not present

## 2024-03-27 DIAGNOSIS — L853 Xerosis cutis: Secondary | ICD-10-CM | POA: Diagnosis not present

## 2024-04-03 DIAGNOSIS — J3081 Allergic rhinitis due to animal (cat) (dog) hair and dander: Secondary | ICD-10-CM | POA: Diagnosis not present

## 2024-04-03 DIAGNOSIS — J301 Allergic rhinitis due to pollen: Secondary | ICD-10-CM | POA: Diagnosis not present

## 2024-04-03 DIAGNOSIS — J3089 Other allergic rhinitis: Secondary | ICD-10-CM | POA: Diagnosis not present

## 2024-04-04 DIAGNOSIS — M62838 Other muscle spasm: Secondary | ICD-10-CM | POA: Diagnosis not present

## 2024-04-04 DIAGNOSIS — M5031 Other cervical disc degeneration,  high cervical region: Secondary | ICD-10-CM | POA: Diagnosis not present

## 2024-04-04 DIAGNOSIS — M9901 Segmental and somatic dysfunction of cervical region: Secondary | ICD-10-CM | POA: Diagnosis not present

## 2024-04-10 ENCOUNTER — Other Ambulatory Visit: Payer: Self-pay | Admitting: Internal Medicine

## 2024-04-10 DIAGNOSIS — J3081 Allergic rhinitis due to animal (cat) (dog) hair and dander: Secondary | ICD-10-CM | POA: Diagnosis not present

## 2024-04-10 DIAGNOSIS — J3089 Other allergic rhinitis: Secondary | ICD-10-CM | POA: Diagnosis not present

## 2024-04-10 DIAGNOSIS — J301 Allergic rhinitis due to pollen: Secondary | ICD-10-CM | POA: Diagnosis not present

## 2024-04-10 NOTE — Telephone Encounter (Signed)
**Note De-identified  [place] Obfuscation** Please advise 

## 2024-04-16 DIAGNOSIS — J301 Allergic rhinitis due to pollen: Secondary | ICD-10-CM | POA: Diagnosis not present

## 2024-04-16 DIAGNOSIS — J3089 Other allergic rhinitis: Secondary | ICD-10-CM | POA: Diagnosis not present

## 2024-04-16 DIAGNOSIS — J3081 Allergic rhinitis due to animal (cat) (dog) hair and dander: Secondary | ICD-10-CM | POA: Diagnosis not present

## 2024-04-23 DIAGNOSIS — J3089 Other allergic rhinitis: Secondary | ICD-10-CM | POA: Diagnosis not present

## 2024-04-23 DIAGNOSIS — J3081 Allergic rhinitis due to animal (cat) (dog) hair and dander: Secondary | ICD-10-CM | POA: Diagnosis not present

## 2024-04-23 DIAGNOSIS — J301 Allergic rhinitis due to pollen: Secondary | ICD-10-CM | POA: Diagnosis not present

## 2024-04-30 DIAGNOSIS — J3089 Other allergic rhinitis: Secondary | ICD-10-CM | POA: Diagnosis not present

## 2024-04-30 DIAGNOSIS — J3081 Allergic rhinitis due to animal (cat) (dog) hair and dander: Secondary | ICD-10-CM | POA: Diagnosis not present

## 2024-04-30 DIAGNOSIS — J301 Allergic rhinitis due to pollen: Secondary | ICD-10-CM | POA: Diagnosis not present

## 2024-05-06 DIAGNOSIS — M9901 Segmental and somatic dysfunction of cervical region: Secondary | ICD-10-CM | POA: Diagnosis not present

## 2024-05-06 DIAGNOSIS — M5031 Other cervical disc degeneration,  high cervical region: Secondary | ICD-10-CM | POA: Diagnosis not present

## 2024-05-07 DIAGNOSIS — J3089 Other allergic rhinitis: Secondary | ICD-10-CM | POA: Diagnosis not present

## 2024-05-07 DIAGNOSIS — J301 Allergic rhinitis due to pollen: Secondary | ICD-10-CM | POA: Diagnosis not present

## 2024-05-07 DIAGNOSIS — J3081 Allergic rhinitis due to animal (cat) (dog) hair and dander: Secondary | ICD-10-CM | POA: Diagnosis not present

## 2024-05-15 DIAGNOSIS — J301 Allergic rhinitis due to pollen: Secondary | ICD-10-CM | POA: Diagnosis not present

## 2024-05-15 DIAGNOSIS — J3081 Allergic rhinitis due to animal (cat) (dog) hair and dander: Secondary | ICD-10-CM | POA: Diagnosis not present

## 2024-05-15 DIAGNOSIS — J3089 Other allergic rhinitis: Secondary | ICD-10-CM | POA: Diagnosis not present

## 2024-05-21 DIAGNOSIS — J3089 Other allergic rhinitis: Secondary | ICD-10-CM | POA: Diagnosis not present

## 2024-05-21 DIAGNOSIS — J301 Allergic rhinitis due to pollen: Secondary | ICD-10-CM | POA: Diagnosis not present

## 2024-05-21 DIAGNOSIS — J3081 Allergic rhinitis due to animal (cat) (dog) hair and dander: Secondary | ICD-10-CM | POA: Diagnosis not present

## 2024-05-29 DIAGNOSIS — J3081 Allergic rhinitis due to animal (cat) (dog) hair and dander: Secondary | ICD-10-CM | POA: Diagnosis not present

## 2024-05-29 DIAGNOSIS — J3089 Other allergic rhinitis: Secondary | ICD-10-CM | POA: Diagnosis not present

## 2024-05-29 DIAGNOSIS — J301 Allergic rhinitis due to pollen: Secondary | ICD-10-CM | POA: Diagnosis not present

## 2024-06-04 DIAGNOSIS — M5031 Other cervical disc degeneration,  high cervical region: Secondary | ICD-10-CM | POA: Diagnosis not present

## 2024-06-04 DIAGNOSIS — M9901 Segmental and somatic dysfunction of cervical region: Secondary | ICD-10-CM | POA: Diagnosis not present

## 2024-06-05 DIAGNOSIS — J3089 Other allergic rhinitis: Secondary | ICD-10-CM | POA: Diagnosis not present

## 2024-06-05 DIAGNOSIS — J3081 Allergic rhinitis due to animal (cat) (dog) hair and dander: Secondary | ICD-10-CM | POA: Diagnosis not present

## 2024-06-05 DIAGNOSIS — J301 Allergic rhinitis due to pollen: Secondary | ICD-10-CM | POA: Diagnosis not present

## 2024-06-06 DIAGNOSIS — M9901 Segmental and somatic dysfunction of cervical region: Secondary | ICD-10-CM | POA: Diagnosis not present

## 2024-06-06 DIAGNOSIS — M5031 Other cervical disc degeneration,  high cervical region: Secondary | ICD-10-CM | POA: Diagnosis not present

## 2024-06-11 DIAGNOSIS — J301 Allergic rhinitis due to pollen: Secondary | ICD-10-CM | POA: Diagnosis not present

## 2024-06-11 DIAGNOSIS — J3081 Allergic rhinitis due to animal (cat) (dog) hair and dander: Secondary | ICD-10-CM | POA: Diagnosis not present

## 2024-06-11 DIAGNOSIS — J3089 Other allergic rhinitis: Secondary | ICD-10-CM | POA: Diagnosis not present

## 2024-06-12 DIAGNOSIS — M9901 Segmental and somatic dysfunction of cervical region: Secondary | ICD-10-CM | POA: Diagnosis not present

## 2024-06-12 DIAGNOSIS — M5031 Other cervical disc degeneration,  high cervical region: Secondary | ICD-10-CM | POA: Diagnosis not present

## 2024-06-17 ENCOUNTER — Ambulatory Visit (INDEPENDENT_AMBULATORY_CARE_PROVIDER_SITE_OTHER): Admitting: Otolaryngology

## 2024-06-17 ENCOUNTER — Ambulatory Visit (INDEPENDENT_AMBULATORY_CARE_PROVIDER_SITE_OTHER): Admitting: Audiology

## 2024-06-17 ENCOUNTER — Encounter (INDEPENDENT_AMBULATORY_CARE_PROVIDER_SITE_OTHER): Payer: Self-pay | Admitting: Otolaryngology

## 2024-06-17 VITALS — BP 188/62 | HR 64 | Ht 65.0 in | Wt 145.0 lb

## 2024-06-17 DIAGNOSIS — H6123 Impacted cerumen, bilateral: Secondary | ICD-10-CM | POA: Diagnosis not present

## 2024-06-17 DIAGNOSIS — H903 Sensorineural hearing loss, bilateral: Secondary | ICD-10-CM

## 2024-06-17 DIAGNOSIS — H9313 Tinnitus, bilateral: Secondary | ICD-10-CM
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
## 2024-06-19 DIAGNOSIS — H9313 Tinnitus, bilateral: Secondary | ICD-10-CM | POA: Insufficient documentation

## 2024-06-19 DIAGNOSIS — J3089 Other allergic rhinitis: Secondary | ICD-10-CM | POA: Diagnosis not present

## 2024-06-19 DIAGNOSIS — J301 Allergic rhinitis due to pollen: Secondary | ICD-10-CM | POA: Diagnosis not present

## 2024-06-19 DIAGNOSIS — H903 Sensorineural hearing loss, bilateral: Secondary | ICD-10-CM | POA: Insufficient documentation

## 2024-06-19 DIAGNOSIS — J3081 Allergic rhinitis due to animal (cat) (dog) hair and dander: Secondary | ICD-10-CM | POA: Diagnosis not present

## 2024-06-19 DIAGNOSIS — H6123 Impacted cerumen, bilateral: Secondary | ICD-10-CM | POA: Insufficient documentation
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
## 2024-06-25 ENCOUNTER — Encounter: Payer: Self-pay | Admitting: Audiology

## 2024-07-03 DIAGNOSIS — J3081 Allergic rhinitis due to animal (cat) (dog) hair and dander: Secondary | ICD-10-CM | POA: Diagnosis not present

## 2024-07-03 DIAGNOSIS — J3089 Other allergic rhinitis: Secondary | ICD-10-CM | POA: Diagnosis not present

## 2024-07-03 DIAGNOSIS — J301 Allergic rhinitis due to pollen: Secondary | ICD-10-CM | POA: Diagnosis not present

## 2024-07-09 DIAGNOSIS — J3089 Other allergic rhinitis: Secondary | ICD-10-CM | POA: Diagnosis not present

## 2024-07-09 DIAGNOSIS — J301 Allergic rhinitis due to pollen: Secondary | ICD-10-CM | POA: Diagnosis not present

## 2024-07-09 DIAGNOSIS — J3081 Allergic rhinitis due to animal (cat) (dog) hair and dander: Secondary | ICD-10-CM | POA: Diagnosis not present

## 2024-07-10 DIAGNOSIS — J3089 Other allergic rhinitis: Secondary | ICD-10-CM | POA: Diagnosis not present

## 2024-07-10 DIAGNOSIS — J301 Allergic rhinitis due to pollen: Secondary | ICD-10-CM | POA: Diagnosis not present

## 2024-07-15 DIAGNOSIS — M5031 Other cervical disc degeneration,  high cervical region: Secondary | ICD-10-CM | POA: Diagnosis not present

## 2024-07-15 DIAGNOSIS — M9901 Segmental and somatic dysfunction of cervical region: Secondary | ICD-10-CM | POA: Diagnosis not present

## 2024-07-16 DIAGNOSIS — J301 Allergic rhinitis due to pollen: Secondary | ICD-10-CM | POA: Diagnosis not present

## 2024-07-16 DIAGNOSIS — J3089 Other allergic rhinitis: Secondary | ICD-10-CM | POA: Diagnosis not present

## 2024-07-16 DIAGNOSIS — J3081 Allergic rhinitis due to animal (cat) (dog) hair and dander: Secondary | ICD-10-CM | POA: Diagnosis not present

## 2024-07-30 DIAGNOSIS — M5031 Other cervical disc degeneration,  high cervical region: Secondary | ICD-10-CM | POA: Diagnosis not present

## 2024-07-30 DIAGNOSIS — M9901 Segmental and somatic dysfunction of cervical region: Secondary | ICD-10-CM | POA: Diagnosis not present

## 2024-07-31 DIAGNOSIS — J301 Allergic rhinitis due to pollen: Secondary | ICD-10-CM | POA: Diagnosis not present

## 2024-07-31 DIAGNOSIS — J3081 Allergic rhinitis due to animal (cat) (dog) hair and dander: Secondary | ICD-10-CM | POA: Diagnosis not present

## 2024-07-31 DIAGNOSIS — J3089 Other allergic rhinitis: Secondary | ICD-10-CM | POA: Diagnosis not present

## 2024-08-07 DIAGNOSIS — J3081 Allergic rhinitis due to animal (cat) (dog) hair and dander: Secondary | ICD-10-CM | POA: Diagnosis not present

## 2024-08-07 DIAGNOSIS — J301 Allergic rhinitis due to pollen: Secondary | ICD-10-CM | POA: Diagnosis not present

## 2024-08-07 DIAGNOSIS — J3089 Other allergic rhinitis: Secondary | ICD-10-CM | POA: Diagnosis not present

## 2024-08-14 ENCOUNTER — Telehealth: Payer: Self-pay | Admitting: Cardiology

## 2024-08-14 DIAGNOSIS — J3081 Allergic rhinitis due to animal (cat) (dog) hair and dander: Secondary | ICD-10-CM | POA: Diagnosis not present

## 2024-08-14 DIAGNOSIS — J301 Allergic rhinitis due to pollen: Secondary | ICD-10-CM | POA: Diagnosis not present

## 2024-08-14 DIAGNOSIS — J3089 Other allergic rhinitis: Secondary | ICD-10-CM | POA: Diagnosis not present

## 2024-08-14 NOTE — Telephone Encounter (Signed)
 Spoke with pt, Aware of dr vertie recommendations.

## 2024-08-14 NOTE — Telephone Encounter (Signed)
  The patient would like to know if she needs to have any blood work done when she sees Dr. Pietro on 10/17/24 at 9:40 AM. She also wants to know if she needs to come in fasting.

## 2024-08-14 NOTE — Telephone Encounter (Signed)
 Will forward to Dr Pietro for review.  Pt's appointment is in December.

## 2024-08-19 DIAGNOSIS — M9901 Segmental and somatic dysfunction of cervical region: Secondary | ICD-10-CM | POA: Diagnosis not present

## 2024-08-19 DIAGNOSIS — M5031 Other cervical disc degeneration,  high cervical region: Secondary | ICD-10-CM | POA: Diagnosis not present

## 2024-08-21 DIAGNOSIS — J3089 Other allergic rhinitis: Secondary | ICD-10-CM | POA: Diagnosis not present

## 2024-08-21 DIAGNOSIS — J3081 Allergic rhinitis due to animal (cat) (dog) hair and dander: Secondary | ICD-10-CM | POA: Diagnosis not present

## 2024-08-21 DIAGNOSIS — J301 Allergic rhinitis due to pollen: Secondary | ICD-10-CM | POA: Diagnosis not present

## 2024-08-21 NOTE — Progress Notes (Signed)
 HPI female former smoker followed for chronic insomnia since fall with hand injury/reflex sympathetic dystrophy., Complicated by allergic rhinitis (Dr. Frutoso), GERD Says she failed trazodone and Lunesta . She is afraid of what she has heard about Ambien side effects/sleepwalking her zaleplon  relaxes her so she can go to sleep but often lets her wake during the night.   ----------------------------------------------------------------------------------. Melanie Evans  08/22/23- 78 year old female former smoker followed for chronic insomnia since fall with hand injx sympathetic dystrophy., Complicated by allergic rhinitis (Dr. Frutoso), GERD, HTN,  Med Hx: Failed trazodone, Doxepin, Benadryl, temazepam, Lunesta , zaleplon ,  Uncomfortable with reported Ambien side effects/sleepwalking.  Zaleplon  did not last long enough -Clonazepam  0.5 1-3 at hs Sleeping fairly well with clonazepam  after trying several others. Had pneumonia Rx'd as outpatient in Spring. Discussed vaccines. Rhinitis managed by her Allergist- seasonal postnasal drip.  08/22/24- 78 year old female former smoker followed for chronic insomnia since fall with hand injx sympathetic dystrophy., Complicated by allergic rhinitis (Dr. Frutoso), GERD, HTN,  Med Hx: Failed trazodone, Doxepin, Benadryl, temazepam, Lunesta , zaleplon ,  Uncomfortable with reported Ambien side effects/sleepwalking.  Zaleplon  did not last long enough -Clonazepam  0.5 1-3 at hs Discussed the use of AI scribe software for clinical note transcription with the patient, who gave verbal consent to proceed.  History of Present Illness   Melanie Evans is an 78 year old female followed for Insomnia, who presents with worsening allergy symptoms and sleep disturbances.  She experiences significant allergy symptoms, including excessive drainage and coughing. Despite using Flonase, allergy shots, a rescue inhaler, and saline rinses, she continues to have thick mucus and drainage. Zyrtec  provides some relief without causing drowsiness, but Benadryl causes hyperactivity.   She has sleep disturbances, often waking up in the middle of the night and struggling to return to sleep. Clonazepam  generally aids her sleep, but she sometimes wakes up around 3 AM and cannot return to sleep. Melatonin and other methods have not been consistently effective. We again discussed expectations, sleep hygiene and options. We are going to try letting her adding a short half-life med (Sonata ) that she can take around 2-3AM if needed.     Assessment and Plan:    Allergic rhinitis with persistent nasal drainage and cough Chronic allergic rhinitis with persistent nasal drainage and cough, exacerbated by weather changes. Differential includes acid reflux. Discuss with her Allergist. - Continue Flonase and allergy shots. - Consider stronger nasal cortisone sprays. - Consider alternative antihistamines. - Discuss potential acid reflux with primary care.  Insomnia managed with clonazepam  and trial of Sonata  Chronic insomnia with difficulty returning to sleep after waking at night. Managed with clonazepam . Discussed trial of Sonata  for middle-of-the-night awakenings due to its short half-life. Prefers to avoid Benadryl due to stimulating effects. - Continue clonazepam  as usual. - Prescribe Sonata  for middle-of-the-night awakenings.      ROS-see HPI + = positive Constitutional:   No-   weight loss, night sweats, fevers, chills, fatigue, lassitude. HEENT:   + headaches, no-difficulty swallowing, tooth/dental problems, sore throat,       No-  sneezing, itching, ear ache, nasal congestion, post nasal drip,  CV:  No-   chest pain, orthopnea, PND, +swelling in lower extremities, anasarca,  dizziness, palpitations Resp: No-   shortness of breath with exertion or at rest.              No-   productive cough,  + non-productive cough,  No- coughing up of blood.  No-   change in color of mucus.  No-  wheezing.   Skin: No-   rash or lesions. GI:  No-   heartburn, indigestion, abdominal pain, nausea, vomiting,  GU:  MS:  No-   joint pain or swelling.   Neuro-     nothing unusual Psych:  No- change in mood or affect. No depression or+ anxiety.  No memory loss.  OBJ- Physical Exam General- Alert, Oriented, Affect-appropriate/ somewhat anxious personlity, Distress- none acute. Appears well.  Skin- rash-none, lesions- none, excoriation- none Lymphadenopathy- none Head- atraumatic            Eyes- Gross vision intact, PERRLA, conjunctivae and secretions clear            Ears- Hearing, canals-normal            Nose- Clear, no-Septal dev, mucus, polyps, erosion, perforation             Throat- Mallampati II , mucosa clear , drainage- none, tonsils- atrophic Neck- flexible , trachea midline, no stridor , thyroid  nl, carotid no bruit Chest - symmetrical excursion , unlabored           Heart/CV- RRR , no murmur , no gallop  , no rub, nl s1 s2                           - JVD- none , edema- none, stasis changes- none, varices- none           Lung- clear to P&A, wheeze- none, cough- none , dullness-none, rub- none           Chest wall-  Abd-  Br/ Gen/ Rectal- Not done, not indicated Extrem- cyanosis- none, clubbing, none, atrophy- none, strength- nl Neuro- grossly intact to observation

## 2024-08-22 ENCOUNTER — Ambulatory Visit: Payer: Medicare HMO | Admitting: Internal Medicine

## 2024-08-22 ENCOUNTER — Encounter: Payer: Self-pay | Admitting: Internal Medicine

## 2024-08-22 VITALS — BP 130/76 | HR 74 | Temp 98.2°F | Ht 65.5 in | Wt 151.0 lb

## 2024-08-22 DIAGNOSIS — J3089 Other allergic rhinitis: Secondary | ICD-10-CM | POA: Diagnosis not present

## 2024-08-22 DIAGNOSIS — F5104 Psychophysiologic insomnia: Secondary | ICD-10-CM

## 2024-08-22 DIAGNOSIS — J302 Other seasonal allergic rhinitis: Secondary | ICD-10-CM | POA: Diagnosis not present

## 2024-08-22 MED ORDER — CLONAZEPAM 0.5 MG PO TABS: ORAL_TABLET | ORAL | 5 refills | Status: DC

## 2024-08-22 MED ORDER — ZALEPLON 5 MG PO CAPS: ORAL_CAPSULE | ORAL | 5 refills | Status: AC

## 2024-08-22 NOTE — Patient Instructions (Signed)
 Script sent refilling clonazepam   Script sent adding Sonata  as an extra medicine to take if you wake at 3:00 or so and can't get back to sleep.

## 2024-08-24 ENCOUNTER — Encounter: Payer: Self-pay | Admitting: Internal Medicine

## 2024-08-26 DIAGNOSIS — J3089 Other allergic rhinitis: Secondary | ICD-10-CM | POA: Diagnosis not present

## 2024-08-26 DIAGNOSIS — K219 Gastro-esophageal reflux disease without esophagitis: Secondary | ICD-10-CM | POA: Diagnosis not present

## 2024-08-26 DIAGNOSIS — Z79899 Other long term (current) drug therapy: Secondary | ICD-10-CM | POA: Diagnosis not present

## 2024-08-26 DIAGNOSIS — J4520 Mild intermittent asthma, uncomplicated: Secondary | ICD-10-CM | POA: Diagnosis not present

## 2024-08-28 DIAGNOSIS — J3089 Other allergic rhinitis: Secondary | ICD-10-CM | POA: Diagnosis not present

## 2024-08-28 DIAGNOSIS — J3081 Allergic rhinitis due to animal (cat) (dog) hair and dander: Secondary | ICD-10-CM | POA: Diagnosis not present

## 2024-08-28 DIAGNOSIS — J301 Allergic rhinitis due to pollen: Secondary | ICD-10-CM | POA: Diagnosis not present

## 2024-09-04 DIAGNOSIS — J3081 Allergic rhinitis due to animal (cat) (dog) hair and dander: Secondary | ICD-10-CM | POA: Diagnosis not present

## 2024-09-04 DIAGNOSIS — J3089 Other allergic rhinitis: Secondary | ICD-10-CM | POA: Diagnosis not present

## 2024-09-04 DIAGNOSIS — J301 Allergic rhinitis due to pollen: Secondary | ICD-10-CM | POA: Diagnosis not present

## 2024-09-12 DIAGNOSIS — J3081 Allergic rhinitis due to animal (cat) (dog) hair and dander: Secondary | ICD-10-CM | POA: Diagnosis not present

## 2024-09-12 DIAGNOSIS — J3089 Other allergic rhinitis: Secondary | ICD-10-CM | POA: Diagnosis not present

## 2024-09-12 DIAGNOSIS — J301 Allergic rhinitis due to pollen: Secondary | ICD-10-CM | POA: Diagnosis not present

## 2024-09-18 DIAGNOSIS — J3081 Allergic rhinitis due to animal (cat) (dog) hair and dander: Secondary | ICD-10-CM | POA: Diagnosis not present

## 2024-09-18 DIAGNOSIS — J3089 Other allergic rhinitis: Secondary | ICD-10-CM | POA: Diagnosis not present

## 2024-09-18 DIAGNOSIS — J301 Allergic rhinitis due to pollen: Secondary | ICD-10-CM | POA: Diagnosis not present

## 2024-09-24 DIAGNOSIS — J3081 Allergic rhinitis due to animal (cat) (dog) hair and dander: Secondary | ICD-10-CM | POA: Diagnosis not present

## 2024-09-24 DIAGNOSIS — J301 Allergic rhinitis due to pollen: Secondary | ICD-10-CM | POA: Diagnosis not present

## 2024-09-24 DIAGNOSIS — M5031 Other cervical disc degeneration,  high cervical region: Secondary | ICD-10-CM | POA: Diagnosis not present

## 2024-09-24 DIAGNOSIS — J3089 Other allergic rhinitis: Secondary | ICD-10-CM | POA: Diagnosis not present

## 2024-09-24 DIAGNOSIS — M9901 Segmental and somatic dysfunction of cervical region: Secondary | ICD-10-CM | POA: Diagnosis not present

## 2024-09-25 ENCOUNTER — Inpatient Hospital Stay (HOSPITAL_BASED_OUTPATIENT_CLINIC_OR_DEPARTMENT_OTHER)
Admission: EM | Admit: 2024-09-25 | Discharge: 2024-09-27 | DRG: 280 | Disposition: A | Discharge status: Home / Self Care | Attending: Family Medicine | Admitting: Family Medicine

## 2024-09-25 ENCOUNTER — Emergency Department (HOSPITAL_BASED_OUTPATIENT_CLINIC_OR_DEPARTMENT_OTHER)

## 2024-09-25 ENCOUNTER — Encounter (HOSPITAL_BASED_OUTPATIENT_CLINIC_OR_DEPARTMENT_OTHER): Payer: Self-pay | Admitting: *Deleted

## 2024-09-25 ENCOUNTER — Other Ambulatory Visit: Payer: Self-pay

## 2024-09-25 ENCOUNTER — Emergency Department (HOSPITAL_BASED_OUTPATIENT_CLINIC_OR_DEPARTMENT_OTHER): Admitting: Radiology

## 2024-09-25 DIAGNOSIS — I5021 Acute systolic (congestive) heart failure: Secondary | ICD-10-CM | POA: Diagnosis present

## 2024-09-25 DIAGNOSIS — I959 Hypotension, unspecified: Secondary | ICD-10-CM | POA: Diagnosis not present

## 2024-09-25 DIAGNOSIS — I214 Non-ST elevation (NSTEMI) myocardial infarction: Principal | ICD-10-CM | POA: Diagnosis present

## 2024-09-25 DIAGNOSIS — Z87891 Personal history of nicotine dependence: Secondary | ICD-10-CM

## 2024-09-25 DIAGNOSIS — I2510 Atherosclerotic heart disease of native coronary artery without angina pectoris: Secondary | ICD-10-CM | POA: Diagnosis present

## 2024-09-25 DIAGNOSIS — Z8041 Family history of malignant neoplasm of ovary: Secondary | ICD-10-CM

## 2024-09-25 DIAGNOSIS — F419 Anxiety disorder, unspecified: Secondary | ICD-10-CM | POA: Diagnosis present

## 2024-09-25 DIAGNOSIS — Z1152 Encounter for screening for COVID-19: Secondary | ICD-10-CM | POA: Diagnosis not present

## 2024-09-25 DIAGNOSIS — Z801 Family history of malignant neoplasm of trachea, bronchus and lung: Secondary | ICD-10-CM | POA: Diagnosis not present

## 2024-09-25 DIAGNOSIS — E785 Hyperlipidemia, unspecified: Secondary | ICD-10-CM | POA: Diagnosis not present

## 2024-09-25 DIAGNOSIS — R059 Cough, unspecified: Secondary | ICD-10-CM | POA: Diagnosis not present

## 2024-09-25 DIAGNOSIS — Z882 Allergy status to sulfonamides status: Secondary | ICD-10-CM

## 2024-09-25 DIAGNOSIS — R7989 Other specified abnormal findings of blood chemistry: Secondary | ICD-10-CM | POA: Diagnosis not present

## 2024-09-25 DIAGNOSIS — J45909 Unspecified asthma, uncomplicated: Secondary | ICD-10-CM | POA: Diagnosis present

## 2024-09-25 DIAGNOSIS — Z825 Family history of asthma and other chronic lower respiratory diseases: Secondary | ICD-10-CM | POA: Diagnosis not present

## 2024-09-25 DIAGNOSIS — R0781 Pleurodynia: Secondary | ICD-10-CM | POA: Diagnosis not present

## 2024-09-25 DIAGNOSIS — Z888 Allergy status to other drugs, medicaments and biological substances status: Secondary | ICD-10-CM

## 2024-09-25 DIAGNOSIS — K219 Gastro-esophageal reflux disease without esophagitis: Secondary | ICD-10-CM | POA: Diagnosis present

## 2024-09-25 DIAGNOSIS — I10 Essential (primary) hypertension: Secondary | ICD-10-CM | POA: Diagnosis not present

## 2024-09-25 DIAGNOSIS — Z90710 Acquired absence of both cervix and uterus: Secondary | ICD-10-CM

## 2024-09-25 DIAGNOSIS — I110 Hypertensive heart disease with heart failure: Secondary | ICD-10-CM | POA: Diagnosis not present

## 2024-09-25 DIAGNOSIS — I272 Pulmonary hypertension, unspecified: Secondary | ICD-10-CM | POA: Diagnosis not present

## 2024-09-25 DIAGNOSIS — I5181 Takotsubo syndrome: Secondary | ICD-10-CM | POA: Diagnosis not present

## 2024-09-25 LAB — RESP PANEL BY RT-PCR (RSV, FLU A&B, COVID)  RVPGX2
Influenza A by PCR: NEGATIVE
Influenza B by PCR: NEGATIVE
Resp Syncytial Virus by PCR: NEGATIVE
SARS Coronavirus 2 by RT PCR: NEGATIVE

## 2024-09-25 LAB — TROPONIN T, HIGH SENSITIVITY
Troponin T High Sensitivity: 230 ng/L (ref 0–19)
Troponin T High Sensitivity: 198 ng/L (ref 0–19)
Troponin T High Sensitivity: 223 ng/L (ref 0–19)

## 2024-09-25 LAB — COMPREHENSIVE METABOLIC PANEL WITH GFR
ALT: 25 U/L (ref 0–44)
AST: 36 U/L (ref 15–41)
Albumin: 4 g/dL (ref 3.5–5.0)
Alkaline Phosphatase: 95 U/L (ref 38–126)
Anion gap: 10 (ref 5–15)
BUN: 15 mg/dL (ref 8–23)
CO2: 27 mmol/L (ref 22–32)
Calcium: 10.1 mg/dL (ref 8.9–10.3)
Chloride: 103 mmol/L (ref 98–111)
Creatinine, Ser: 0.81 mg/dL (ref 0.44–1.00)
GFR, Estimated: >60 mL/min (ref >60)
Glucose, Bld: 95 mg/dL (ref 70–99)
Potassium: 4.4 mmol/L (ref 3.5–5.1)
Sodium: 140 mmol/L (ref 135–145)
Total Bilirubin: 1 mg/dL (ref 0.0–1.2)
Total Protein: 7 g/dL (ref 6.5–8.1)

## 2024-09-25 LAB — CBC WITH DIFFERENTIAL/PLATELET
Abs Immature Granulocytes: 0.03 K/uL (ref 0.00–0.07)
Basophils Absolute: 0 K/uL (ref 0.0–0.1)
Basophils Relative: 1 %
Eosinophils Absolute: 0.1 K/uL (ref 0.0–0.5)
Eosinophils Relative: 1 %
HCT: 38.1 % (ref 36.0–46.0)
Hemoglobin: 12.8 g/dL (ref 12.0–15.0)
Immature Granulocytes: 0 %
Lymphocytes Relative: 21 %
Lymphs Abs: 1.6 K/uL (ref 0.7–4.0)
MCH: 31.7 pg (ref 26.0–34.0)
MCHC: 33.6 g/dL (ref 30.0–36.0)
MCV: 94.3 fL (ref 80.0–100.0)
Monocytes Absolute: 0.6 K/uL (ref 0.1–1.0)
Monocytes Relative: 8 %
Neutro Abs: 5.3 K/uL (ref 1.7–7.7)
Neutrophils Relative %: 69 %
Platelets: 211 K/uL (ref 150–400)
RBC: 4.04 MIL/uL (ref 3.87–5.11)
RDW: 13.2 % (ref 11.5–15.5)
WBC: 7.6 K/uL (ref 4.0–10.5)
nRBC: 0 % (ref 0.0–0.2)

## 2024-09-25 LAB — GLUCOSE, CAPILLARY: Glucose-Capillary: 114 mg/dL — ABNORMAL HIGH (ref 70–99)

## 2024-09-25 LAB — TROPONIN I (HIGH SENSITIVITY): Troponin I (High Sensitivity): 900 ng/L (ref <18)

## 2024-09-25 LAB — PRO BRAIN NATRIURETIC PEPTIDE: Pro Brain Natriuretic Peptide: 5730 pg/mL — ABNORMAL HIGH (ref <300.0)

## 2024-09-25 LAB — HEPARIN LEVEL (UNFRACTIONATED): Heparin Unfractionated: 0.38 [IU]/mL (ref 0.30–0.70)

## 2024-09-25 LAB — PROTIME-INR
INR: 0.9 (ref 0.8–1.2)
Prothrombin Time: 12.3 s (ref 11.4–15.2)

## 2024-09-25 MED ORDER — ACETAMINOPHEN 650 MG RE SUPP
650.0000 mg | Freq: Four times a day (QID) | RECTAL | Status: DC | PRN
Start: 2024-09-25 — End: 2024-09-27

## 2024-09-25 MED ORDER — FLUTICASONE PROPIONATE 50 MCG/ACT NA SUSP: 2.0000 | Freq: Every day | NASAL | Status: DC | PRN

## 2024-09-25 MED ORDER — ACETAMINOPHEN 325 MG PO TABS: 650.0000 mg | ORAL_TABLET | Freq: Four times a day (QID) | ORAL | Status: DC | PRN

## 2024-09-25 MED ORDER — IOHEXOL 350 MG/ML SOLN
100.0000 mL | Freq: Once | INTRAVENOUS | Status: AC | PRN
  Administered 2024-09-25: 75 mL via INTRAVENOUS

## 2024-09-25 MED ORDER — HEPARIN (PORCINE) 25000 UT/250ML-% IV SOLN
800.0000 [IU]/h | INTRAVENOUS | Status: DC
  Administered 2024-09-25 – 2024-09-26 (×2): 800 [IU]/h via INTRAVENOUS
  Filled 2024-09-25 (×2): qty 250

## 2024-09-25 MED ORDER — SENNOSIDES-DOCUSATE SODIUM 8.6-50 MG PO TABS
1.0000 | ORAL_TABLET | Freq: Every evening | ORAL | Status: DC | PRN
Start: 2024-09-25 — End: 2024-09-27
  Administered 2024-09-26: 1 via ORAL
  Filled 2024-09-25: qty 1

## 2024-09-25 MED ORDER — BISACODYL 5 MG PO TBEC
5.0000 mg | DELAYED_RELEASE_TABLET | Freq: Every day | ORAL | Status: DC | PRN
  Administered 2024-09-26: 5 mg via ORAL
  Filled 2024-09-25: qty 1

## 2024-09-25 MED ORDER — HEPARIN SODIUM (PORCINE) 5000 UNIT/ML IJ SOLN: 4000.0000 [IU] | Freq: Once | INTRAMUSCULAR | Status: DC

## 2024-09-25 MED ORDER — NITROGLYCERIN 0.4 MG SL SUBL: 0.4000 mg | SUBLINGUAL_TABLET | SUBLINGUAL | Status: DC | PRN

## 2024-09-25 MED ORDER — FREE WATER: 250.0000 mL | Freq: Once | Status: DC

## 2024-09-25 MED ORDER — ASPIRIN 81 MG PO CHEW
81.0000 mg | CHEWABLE_TABLET | ORAL | Status: AC
  Administered 2024-09-26: 81 mg via ORAL
  Filled 2024-09-25: qty 1

## 2024-09-25 MED ORDER — ONDANSETRON HCL 4 MG/2ML IJ SOLN
4.0000 mg | Freq: Four times a day (QID) | INTRAMUSCULAR | Status: DC | PRN
Start: 2024-09-25 — End: 2024-09-25

## 2024-09-25 MED ORDER — HEPARIN (PORCINE) 25000 UT/250ML-% IV SOLN: 12.0000 [IU]/kg/h | INTRAVENOUS | Status: DC

## 2024-09-25 MED ORDER — SODIUM CHLORIDE 0.9% FLUSH: 3.0000 mL | Freq: Once | INTRAVENOUS | Status: DC

## 2024-09-25 MED ORDER — CLONAZEPAM 0.5 MG PO TABS
0.5000 mg | ORAL_TABLET | Freq: Every day | ORAL | Status: DC
  Administered 2024-09-25 – 2024-09-26 (×2): 0.5 mg via ORAL
  Filled 2024-09-25 (×2): qty 1

## 2024-09-25 MED ORDER — IPRATROPIUM-ALBUTEROL 0.5-2.5 (3) MG/3ML IN SOLN
3.0000 mL | Freq: Four times a day (QID) | RESPIRATORY_TRACT | Status: DC | PRN
Start: 2024-09-25 — End: 2024-09-27

## 2024-09-25 MED ORDER — ONDANSETRON HCL 4 MG PO TABS
4.0000 mg | ORAL_TABLET | Freq: Four times a day (QID) | ORAL | Status: DC | PRN
Start: 2024-09-25 — End: 2024-09-25

## 2024-09-25 MED ORDER — HEPARIN BOLUS VIA INFUSION
4000.0000 [IU] | Freq: Once | INTRAVENOUS | Status: AC
  Administered 2024-09-25: 4000 [IU] via INTRAVENOUS

## 2024-09-25 MED ORDER — ASPIRIN 81 MG PO CHEW
324.0000 mg | CHEWABLE_TABLET | Freq: Once | ORAL | Status: AC
  Administered 2024-09-25: 324 mg via ORAL
  Filled 2024-09-25: qty 4

## 2024-09-25 NOTE — Progress Notes (Signed)
 PHARMACY - ANTICOAGULATION CONSULT NOTE  Pharmacy Consult for heparin  Indication: chest pain/ACS  Allergies  Allergen Reactions   Cefzil [Cefprozil]    Erythromycin    Sulfa Antibiotics     Patient Measurements: Weight: 67.1 kg (148 lb)  Vital Signs: Temp: 98.1 F (36.7 C) (11/19 0947) Temp Source: Oral (11/19 0947) BP: 132/75 (11/19 1200) Pulse Rate: 76 (11/19 1200)  Labs: Recent Labs    09/25/24 0951 09/25/24 0956  HGB 12.8  --   HCT 38.1  --   PLT 211  --   LABPROT  --  12.3  INR  --  0.9  CREATININE  --  0.81    Estimated Creatinine Clearance: 50.9 mL/min (by C-G formula based on SCr of 0.81 mg/dL).   Medical History: Past Medical History:  Diagnosis Date   Allergy    Asthma    Environmental allergies    GERD (gastroesophageal reflux disease)    Hyperlipidemia    Hypertension    Murmur    Nervous disorder     Medications:  Infusions:   heparin  800 Units/hr (09/25/24 1223)    Assessment: 78 yof presented to the ED with ShOB. Troponin elevated and now starting IV heparin . Baseline CBC is WNL and she is not on anticoagulation PTA.   Goal of Therapy:  Heparin  level 0.3-0.7 units/ml Monitor platelets by anticoagulation protocol: Yes   Plan:  Heparin  bolus 4000 units IV x 1 Heparin  gtt 800 units/hr Check an 8 hr heparin  level Daily heparin  level and CBC  Teesha Ohm, Vernell Helling 09/25/2024,12:18 PM

## 2024-09-25 NOTE — ED Notes (Signed)
 Pt reports L sided chest/side pain. Ar[place], PA notified. 12-Lead EKG performed and repeat Troponin collected.

## 2024-09-25 NOTE — ED Notes (Signed)
 Called Lauren at [facility] for transport 16:22 TC

## 2024-09-25 NOTE — H&P (Signed)
 History and Physical  [name] FMW:[id number] DOB: 20-Aug-1946 DOA: 09/25/2024  PCP: Okey Carlin Redbird, MD   Chief Complaint: Shortness of breath, chest pain  HPI: Melanie Evans is a 78 y.o. female with medical history significant for allergies, asthma, HTN, HLD, palpitations and anxiety who presented to the [facility] for evaluation of shortness of breath and chest pain. Patient reports she has been dealing with allergies symptoms over the last few weeks. She has also had intermittent chest discomfort for about a week. Last night, she had worsening of her left-sided chest pain described as pressure-like but associated shortness of breath and cough.  She used her home inhaler without any relief so she presented to the ED for further evaluation. She endorsed intermittent palpitations but denies any orthopnea, PND, leg swelling, abdominal pain, nausea, vomiting or diaphoresis.  She has been under slight stress since moving to a small apartment with her husband however no other significant stressors.  [facility] Course: Initial vitals show patient afebrile, HR 80-100, SBP 120-160s, SpO2 96% on room air. Initial labs significant for proBNP 5730, troponin 230->198->223. EKG shows sinus rhythm with APCs, LAFB, prolonged QTc of 499 and T wave inversion in the inferior lateral leads. CXR shows no active disease. CTA chest PE study negative for PE but shows three-vessel coronary vascular calcification. Pt received aspirin  324 mg x 1 and started on IV heparin . Cardiology was consulted for evaluation. Patient was admitted to [facility] service and transferred to [facility].   Review of Systems: Please see HPI for pertinent positives and negatives. A complete 10 system review of systems are otherwise negative.  Past Medical History:  Diagnosis Date   Allergy    Asthma    GERD (gastroesophageal reflux disease)    Hyperlipidemia    Hypertension    Nervous disorder    Past Surgical History:   Procedure Laterality Date   ABDOMINAL HYSTERECTOMY     TONSILLECTOMY     Social History:  reports that she has quit smoking. Her smoking use included cigarettes. She has never used smokeless tobacco. She reports current alcohol use. She reports that she does not use drugs.  Allergies  Allergen Reactions   Cefzil [Cefprozil]    Erythromycin    Sulfa Antibiotics     Family History  Problem Relation Age of Onset   Lung cancer Mother    Asthma Mother    Allergies Mother    Cancer - Other Other    Ovarian cancer Other    Breast cancer Neg Hx      Prior to Admission medications   Medication Sig Start Date End Date Taking? Authorizing Provider  azelastine (ASTELIN) 0.1 % nasal spray Place 1-2 sprays into both nostrils 2 (two) times daily. 08/26/24  Yes [provider]  albuterol  (PROVENTIL  HFA;VENTOLIN  HFA) 108 (90 Base) MCG/ACT inhaler Inhale 1-2 puffs into the lungs every 6 (six) hours as needed for wheezing or shortness of breath.    [provider]  Ascorbic Acid (VITAMIN C ER PO) Take by mouth.    [provider]  azelastine (OPTIVAR) 0.05 % ophthalmic solution INSTILL 1 DROP TO AFFECTED EYE(S) TWICE A DAY 07/05/18   [provider]  b complex vitamins capsule Take 1 capsule by mouth daily.    [provider]  betamethasone valerate ointment (VALISONE) 0.1 % betamethasone valerate 0.1 % topical ointment APPLY SPARINGLY TO AFFECTED AREA TWICE A DAY 09/23/20   [provider]  Cholecalciferol (CVS VITAMIN D3  PO) Take 2,000 mcg by mouth.    [provider]  clobetasol cream (TEMOVATE) 0.05 % Apply topically. 08/17/23   [provider]  clonazePAM  (KLONOPIN ) 0.5 MG tablet TAKE 1-3 TABLETS BY MOUTH DAILY FOR SLEEP IF NEEDED 08/22/24   Young, Reggy D, MD  Coenzyme Q10 (COQ10) 200 MG CAPS Take 1 capsule by mouth daily.    [provider]  Cyanocobalamin (VITAMIN B-12) 500 MCG SUBL Place 1 tablet under the  tongue daily after lunch.    [provider]  EPIPEN 2-PAK 0.3 MG/0.3ML DEVI  10/08/12   [provider]  fluticasone (FLONASE) 50 MCG/ACT nasal spray Place 2 sprays into both nostrils daily.    [provider]  MAGNESIUM GLYCINATE PO Take 2 tablets by mouth.    [provider]  Multiple Vitamins-Minerals (ANTIOXIDANT PO) Take 2 capsules by mouth daily.    [provider]  OMEGA 3 1000 MG CAPS Take 1 capsule by mouth daily.    [provider]  Probiotic Product (PROBIOTIC DAILY PO) Probiotic    [provider]  VITAMIN K PO Take by mouth.    [provider]  zaleplon  (SONATA ) 5 MG capsule 1 at night if needed for sleep 08/22/24   Neysa Reggy D, MD    Physical Exam: BP 134/88 (BP Location: Left Arm)   Pulse 100   Temp 98.3 F (36.8 C) (Oral)   Resp 19   Wt 67.1 kg   SpO2 96%   BMI 24.25 kg/m  General: Pleasant, well-appearing elderly woman laying in bed. No acute distress. HEENT: [place]/AT. Anicteric sclera CV: Tachycardic. Regular rhythm with occasional extra beats. Soft systolic murmur. No LE edema Pulmonary: Lungs CTAB. Normal effort. No wheezing or rales. Abdominal: Soft, nontender, nondistended. Normal bowel sounds. Extremities: Palpable radial and DP pulses. Normal ROM. Skin: Warm and dry. No obvious rash or lesions. Neuro: A&Ox3. Moves all extremities. Normal sensation to light touch. No focal deficit. Psych: Normal mood and affect          Labs on Admission:  Basic Metabolic Panel: Recent Labs  Lab 09/25/24 0956  NA 140  K 4.4  CL 103  CO2 27  GLUCOSE 95  BUN 15  CREATININE 0.81  CALCIUM  10.1   Liver Function Tests: Recent Labs  Lab 09/25/24 0956  AST 36  ALT 25  ALKPHOS 95  BILITOT 1.0  PROT 7.0  ALBUMIN 4.0   No results for input(s): LIPASE, AMYLASE in the last 168 hours. No results for input(s): AMMONIA in the last 168 hours. CBC: Recent Labs  Lab 09/25/24 0951  WBC  7.6  NEUTROABS 5.3  HGB 12.8  HCT 38.1  MCV 94.3  PLT 211   Cardiac Enzymes: No results for input(s): CKTOTAL, CKMB, CKMBINDEX, TROPONINI in the last 168 hours. BNP (last 3 results) No results for input(s): BNP in the last 8760 hours.  ProBNP (last 3 results) Recent Labs    09/25/24 0951  PROBNP 5,730.0*    CBG: Recent Labs  Lab 09/25/24 1827  GLUCAP 114*    Radiological Exams on Admission: CT Angio Chest PE W and/or Wo Contrast Result Date: 09/25/2024 CLINICAL DATA:  Concern for embolism. EXAM: CT ANGIOGRAPHY CHEST WITH CONTRAST TECHNIQUE: Multidetector CT imaging of the chest was performed using the standard protocol during bolus administration of intravenous contrast. Multiplanar CT image reconstructions and MIPs were obtained to evaluate the vascular anatomy. RADIATION DOSE REDUCTION: This exam was performed according to the departmental dose-optimization program  which includes automated exposure control, adjustment of the mA and/or kV according to patient size and/or use of iterative reconstruction technique. CONTRAST:  75mL OMNIPAQUE  IOHEXOL  350 MG/ML SOLN COMPARISON:  Chest [place]  dated 09/25/2024 and CT dated 07/27/2022. FINDINGS: Cardiovascular: There is no cardiomegaly or pericardial effusion. There is 3 vessel coronary vascular calcification. Mild atherosclerotic calcification of the thoracic aorta. No aneurysmal dilatation. No pulmonary artery embolus identified. Mediastinum/Nodes: No hilar or mediastinal adenopathy. The esophagus is grossly unremarkable. No mediastinal fluid collection. Lungs/Pleura: No focal consolidation, pleural effusion, or pneumothorax. The central airways are patent. Upper Abdomen: No acute abnormality. Musculoskeletal: No acute osseous pathology. Chronic T8 compression fracture and vertebroplasty with extruded segment into the central canal. Review of the MIP images confirms the above findings. IMPRESSION: 1. No acute intrathoracic  pathology. No CT evidence of pulmonary artery embolus. 2. Three vessel coronary vascular calcification. 3.  Aortic Atherosclerosis (ICD10-I70.0). Electronically Signed   By: Vanetta Chou M.D.   On: 09/25/2024 13:25   DG Chest 2 View Result Date: 09/25/2024 CLINICAL DATA:  Left lateral chest/rib pain and cough which shortness-of-breath. EXAM: CHEST - 2 VIEW COMPARISON:  02/10/2020 FINDINGS: Lungs are adequately inflated without focal airspace consolidation or effusion. Cardiomediastinal silhouette is normal. Interval kyphoplasty of a midthoracic compression fracture. IMPRESSION: No acute cardiopulmonary disease. Electronically Signed   By: Toribio Agreste M.D.   On: 09/25/2024 11:01   Assessment/Plan Melanie Evans is a 78 y.o. female with medical history significant for allergies, asthma, HTN, HLD, palpitations and anxiety who presented to the [facility] for evaluation of shortness of breath and chest pain and admitted for NSTEMI.  # NSTEMI # Elevated troponin - Patient with history of HTN and HLD presented with acute onset of chest pressure with associated shortness of breath - Found to have elevated cardiac enzyme with associated EKG changes - Reports improvement in chest pain after ASA and IV heparin in the ER - Cardiology following, they are concern for possible Takotsubo cardiomyopathy - Recommending TTE followed by ischemic evaluation with cardiac cath - Continue IV heparin - Start nitroglycerin tab as needed  for chest pain - Telemetry  # HTN - BP slightly elevated with SBP in the 140s to 160s, currently down to the 130s - Would likely benefit from a beta-blocker after her ischemic eval  # HLD - Follow-up repeat lipid panel  # Prolonged QTc - Initial EKG showed prolonged QTc of 499 - Avoid QTc prolonging meds  # Anxiety - Continue Klonopin  at bedtime  # Asthma - Chronic and stable, no signs of acute exacerbation - As needed DuoNeb  # Allergies - Continue as  needed Flonase   DVT prophylaxis: Heparin    Code Status: Full Code  Consults called: Cardiology  Family Communication: Discussed admission with family at bedside  Severity of Illness: The appropriate patient status for this patient is INPATIENT. Inpatient status is judged to be reasonable and necessary in order to provide the required intensity of service to ensure the patient's safety. The patient's presenting symptoms, physical exam findings, and initial radiographic and laboratory data in the context of their chronic comorbidities is felt to place them at high risk for further clinical deterioration. Furthermore, it is not anticipated that the patient will be medically stable for discharge from the hospital within 2 midnights of admission.   * I certify that at the point of admission it is my clinical judgment that the patient will require inpatient hospital care spanning beyond 2 midnights from the  point of admission due to high intensity of service, high risk for further deterioration and high frequency of surveillance required.*  Level of care: Telemetry    Lou Claretta HERO, MD 09/25/2024, 8:25 PM [facility] Pager: [phone] Isaiah 41:10   If 7PM-7AM, please contact night-coverage [email or web address] Password [email or web address]

## 2024-09-25 NOTE — ED Triage Notes (Signed)
 BIB spouse from home for sob, onset 1 month ago. Also reports CP and pinpoints L axillary ribs under bra strap, as well as non-productive cough. H/o asthma. Rates pain 5/10. Denies fever, bleeding, NVD, or falls. Mentions some dizziness r/t sob. Also mentions hit head yesterday on garage door w/ related neck pain. Seen chiropractor yesterday. Alert, NAD, calm, interactive, resps e/u, speaking in clear complete sentences. No increased wob noted.

## 2024-09-25 NOTE — ED Notes (Signed)
 Trop 223

## 2024-09-25 NOTE — Progress Notes (Signed)
 Per Dr. Lavona, will add on for [facility] tomorrow. NPO at [facility] please.  Jon Nat Hails, PA-C 09/25/2024, 7:58 PM [phone] [facility] [address] [place], [place] [address]

## 2024-09-25 NOTE — ED Provider Notes (Signed)
 [place] EMERGENCY DEPARTMENT AT [facility] Provider Note   CSN: [id number] Arrival date & time: 09/25/24  9066     Patient presents with: Shortness of Breath   Melanie Evans is a 78 y.o. female with a past medical history of allergies and asthma who presents with complaint of shortness of breath.  Patient reports that she had onset of cough and left-sided chest pain starting last night.  She got very little sleep.  She tried using her inhaler with no relief.  She reports that the pain and coughing were worse when laying on the left side.  They were improved when she right laid on her right side.  She had to sit up several times to catch her breath.  She denies a history of heart failure increased swelling in her abdomen or legs.  She denies a history of recent URI, nasal congestion.  She reports that she was coughing up some clear mucus.  She dates that the pain is on the left side of her chest and worse with breathing.  She has no other complaints at this time.    Shortness of Breath      Prior to Admission medications   Medication Sig Start Date End Date Taking? Authorizing Provider  albuterol  (PROVENTIL  HFA;VENTOLIN  HFA) 108 (90 Base) MCG/ACT inhaler Inhale 1-2 puffs into the lungs every 6 (six) hours as needed for wheezing or shortness of breath.    [provider]  Ascorbic Acid (VITAMIN C ER PO) Take by mouth.    [provider]  azelastine (OPTIVAR) 0.05 % ophthalmic solution INSTILL 1 DROP TO AFFECTED EYE(S) TWICE A DAY 07/05/18   [provider]  b complex vitamins capsule Take 1 capsule by mouth daily.    [provider]  betamethasone valerate ointment (VALISONE) 0.1 % betamethasone valerate 0.1 % topical ointment APPLY SPARINGLY TO AFFECTED AREA TWICE A DAY 09/23/20   [provider]  Cholecalciferol (CVS VITAMIN D3 PO) Take 2,000 mcg by mouth.    [provider]  clobetasol cream (TEMOVATE) 0.05 % Apply  topically. 08/17/23   [provider]  clonazePAM  (KLONOPIN ) 0.5 MG tablet TAKE 1-3 TABLETS BY MOUTH DAILY FOR SLEEP IF NEEDED 08/22/24   Young, Reggy D, MD  Coenzyme Q10 (COQ10) 200 MG CAPS Take 1 capsule by mouth daily.    [provider]  Cyanocobalamin (VITAMIN B-12) 500 MCG SUBL Place 1 tablet under the tongue daily after lunch.    [provider]  EPIPEN 2-PAK 0.3 MG/0.3ML DEVI  10/08/12   [provider]  fluticasone  (FLONASE ) 50 MCG/ACT nasal spray Place 2 sprays into both nostrils daily.    [provider]  MAGNESIUM GLYCINATE PO Take 2 tablets by mouth.    [provider]  Multiple Vitamins-Minerals (ANTIOXIDANT PO) Take 2 capsules by mouth daily.    [provider]  OMEGA 3 1000 MG CAPS Take 1 capsule by mouth daily.    [provider]  Probiotic Product (PROBIOTIC DAILY PO) Probiotic    [provider]  VITAMIN K PO Take by mouth.    [provider]  zaleplon  (SONATA ) 5 MG capsule 1 at night if needed for sleep 08/22/24   Neysa Reggy D, MD    Allergies: Cefzil [cefprozil], Erythromycin, and Sulfa antibiotics    Review of Systems  Respiratory:  Positive for shortness of breath.     Updated Vital Signs BP (!) 141/70 (BP Location: Left Arm)   Pulse 93  Temp 98.1 F (36.7 C) (Oral)   Resp 20   Wt 67.1 kg   SpO2 96%   BMI 24.25 kg/m   Physical Exam Vitals and nursing note reviewed.  Constitutional:      General: She is not in acute distress.    Appearance: She is well-developed. She is not diaphoretic.  HENT:     Head: Normocephalic and atraumatic.     Right Ear: External ear normal.     Left Ear: External ear normal.     Nose: Nose normal.     Mouth/Throat:     Mouth: Mucous membranes are moist.  Eyes:     General: No scleral icterus.    Conjunctiva/sclera: Conjunctivae normal.  Cardiovascular:     Rate and Rhythm: Normal rate and regular rhythm.     Heart sounds:  Normal heart sounds. No murmur heard.    No friction rub. No gallop.  Pulmonary:     Effort: Pulmonary effort is normal. No respiratory distress.     Breath sounds: Normal breath sounds. No decreased breath sounds, wheezing, rhonchi or rales.  Chest:     Chest wall: Tenderness present.     Comments: Tenderness along the left lateral chest wall, no rashes Abdominal:     General: Bowel sounds are normal. There is no distension.     Palpations: Abdomen is soft. There is no mass.     Tenderness: There is no abdominal tenderness. There is no guarding.  Musculoskeletal:     Cervical back: Normal range of motion.  Skin:    General: Skin is warm and dry.  Neurological:     Mental Status: She is alert and oriented to person, place, and time.  Psychiatric:        Behavior: Behavior normal.     (all labs ordered are listed, but only abnormal results are displayed) Labs Reviewed  RESP PANEL BY RT-PCR (RSV, FLU A&B, COVID)  [id number]  CBC WITH DIFFERENTIAL/PLATELET  COMPREHENSIVE METABOLIC PANEL WITH GFR  PROTIME-INR  PRO BRAIN NATRIURETIC PEPTIDE  TROPONIN T, HIGH SENSITIVITY    EKG: EKG Interpretation Date/Time:  Wednesday September 25 2024 10:00:55 EST Ventricular Rate:  80 PR Interval:  192 QRS Duration:  101 QT Interval:  432 QTC Calculation: 499 R Axis:   -52  Text Interpretation: Sinus rhythm Multiple premature complexes, vent & supraven Left anterior fascicular block Nonspecific T abnormalities, diffuse leads Borderline prolonged QT interval lateral changes new Confirmed by Dasie Faden ([id number]) on 09/25/2024 10:03:29 AM  Radiology: No results found.   .Critical Care  Performed by: Arloa Chroman, PA-C Authorized by: Arloa Chroman, PA-C   Critical care provider statement:    Critical care time (minutes):  75   Critical care time was exclusive of:  Separately billable procedures and treating other patients   Critical care was necessary to treat or prevent imminent or  life-threatening deterioration of the following conditions:  Cardiac failure   Critical care was time spent personally by me on the following activities:  Development of treatment plan with patient or surrogate, discussions with consultants, evaluation of patient's response to treatment, examination of patient, ordering and review of laboratory studies, ordering and review of radiographic studies, ordering and performing treatments and interventions, pulse oximetry, re-evaluation of patient's condition, review of old charts, interpretation of cardiac output measurements and obtaining history from patient or surrogate    Medications Ordered in the ED  sodium chloride  flush (NS) 0.9 % injection 3 mL (has no administration in  time range)    Clinical Course as of 09/28/24 1552  Wed Sep 25, 2024  1146 I visualized and interpreted patient's chest x-ray.  Given the fact that there are no acute findings will advance to CT angiogram of the chest [AH]  1146 DG Chest 2 View [AH]  1203 Patient's EKG shows new inversions in the anterior and lateral leads.  No reciprocal changes.  I placed consult to cardiology, repeat EKG to make sure there is no progression of her ischemic changes.  At this time we will plan to give heparin  and aspirin .  She is currently still waiting for a CT PE study. [AH]  1204 Pro Brain Natriuretic Peptide(!): 5,730.0 proBNP is also significantly elevated [AH]  1722 Complaining of left-sided rib pain.  This is consistent with her atypical presentation of MI I am repeating stat EKG as well as troponin. [AH]  1728 Repeat EKG shows sinus tachycardia with slightly irregular rhythm.  P waves present does not appear to be A-fib. [AH]    Clinical Course User Index [AH] Arloa Chroman, PA-C                                 Medical Decision Making Amount and/or Complexity of Data Reviewed Labs: ordered. Decision-making details documented in ED Course. Radiology: ordered. Decision-making  details documented in ED Course. ECG/medicine tests: ordered.  Risk OTC drugs. Prescription drug management. Decision regarding hospitalization.   This patient presents to the ED with chief complaint(s) of shortness of with pertinent past medical history of asthma which further complicates the presenting complaint. The complaint involves an extensive differential diagnosis and treatment options and also carries with it a high risk of complications and morbidity.    The emergent differential diagnosis for shortness of breath includes, but is not limited to, Pulmonary edema, bronchoconstriction, Pneumonia, Pulmonary embolism, Pneumotherax/ Hemothorax, Dysrythmia, ACS.    The initial plan is to order chest x-ray, cardiac labs, respiratory panel and patient has excellent air movement, no wheezing suggestive of COPD or asthma at this time therefore I am not ordering albuterol .  Additional Tests and treatment considered: Additional consideration for pulmonary embolus as patient has pleuritic chest patient which is atypical for emboli  Additional history obtained: Additional history obtained from family and spouse Records reviewed reviewed cardiology notes  Reassessment and review (also see workup area): Lab Tests: I Ordered, and personally interpreted labs.  The pertinent results include:   Troponin elevated significantly elevated BNP at 5730 white count within normal limits CMP within normal  Imaging Studies: I ordered and independently visualized and interpreted the following imaging CT scan angio PE/ CXR  which showed no acute findings The interpretation of the imaging was limited to assessing for emergent pathology, for which purpose it was ordered.  Consultations Obtained: I requested consultation with the admitting physician /cardiology, and discussed  findings as well as pertinent plan - they recommend: Admission for NSTEMI  Medicines ordered and prescription drug management: I  ordered the following medications heparin  for NSTEMI    Reevaluation of the patient after these medicines showed that the patient    improved    Cardiac Monitoring: The patient was maintained on a cardiac monitor.  I personally viewed and interpreted the cardiac monitor which showed an underlying rhythm of:  Sinus rhythm with ischemic changes  Complexity of problems addressed: Patient's presentation is most consistent with  acute presentation with potential threat to life or  bodily function During patient's assessment  Disposition: After consideration of the diagnostic results and the patient's response to treatment,  I feel that the patent would benefit from admission for NSTEMI.       Final diagnoses:  None    ED Discharge Orders     None          Arloa Chroman, PA-C 09/28/24 1605    Dasie Faden, MD 09/29/24 [phone]

## 2024-09-25 NOTE — ED Notes (Signed)
 Trop 230

## 2024-09-25 NOTE — Progress Notes (Signed)
 PHARMACY - ANTICOAGULATION CONSULT NOTE  Pharmacy Consult for heparin Indication: chest pain/ACS  Allergies  Allergen Reactions   Cefzil [Cefprozil]    Erythromycin    Sulfa Antibiotics     Patient Measurements: Weight: 67.1 kg (148 lb)  Vital Signs: Temp: 98.3 F (36.8 C) (11/19 1830) Temp Source: Oral (11/19 1830) BP: 134/88 (11/19 1830) Pulse Rate: 100 (11/19 1830)  Labs: Recent Labs    09/25/24 0951 09/25/24 0956 09/25/24 2025 09/25/24 2026  HGB 12.8  --   --   --   HCT 38.1  --   --   --   PLT 211  --   --   --   LABPROT  --  12.3  --   --   INR  --  0.9  --   --   HEPARINUNFRC  --   --   --  0.38  CREATININE  --  0.81  --   --   TROPONINIHS  --   --  900*  --     Estimated Creatinine Clearance: 50.9 mL/min (by C-G formula based on SCr of 0.81 mg/dL).   Medical History: Past Medical History:  Diagnosis Date   Allergy    Asthma    GERD (gastroesophageal reflux disease)    Hyperlipidemia    Hypertension    Nervous disorder     Medications:  Infusions:   heparin 800 Units/hr (09/25/24 1223)    Assessment: 78 yof presented to the ED with ShOB. Troponin elevated and now starting IV heparin. Baseline CBC is WNL and she is not on anticoagulation PTA.   PM: heparin level 0.38 is therapeutic on 800 units/hr. No issues with the infusion or bleeding reported.  Goal of Therapy:  Heparin level 0.3-0.7 units/ml Monitor platelets by anticoagulation protocol: Yes   Plan:  Continue Heparin gtt 800 units/hr Check an 8 hr confirmatory heparin level Daily heparin level and CBC F/u after LHC tomorrow  Rocky Slade, PharmD, BCPS 09/25/2024,9:48 PM

## 2024-09-25 NOTE — ED Provider Notes (Signed)
 I provided a substantive portion of the care of this patient.  I personally made/approved the management plan for this patient and take responsibility for the patient management.  EKG Interpretation Date/Time:  Wednesday September 25 2024 10:48:17 EST Ventricular Rate:  73 PR Interval:  204 QRS Duration:  98 QT Interval:  409 QTC Calculation: 451 R Axis:   -52  Text Interpretation: Sinus rhythm Atrial premature complexes Left anterior fascicular block Abnormal T, consider ischemia, diffuse leads No significant change since last tracing Confirmed by Dasie Faden ([id number]) on 09/25/2024 11:54:14 AM   Patient's EKG shows normal sinus rhythm.  Patient presented with pleuritic chest pain and elevated troponin with inferior lateral new T wave inversions on her EKG.  Concern for possible NSTEMI.  Started on IV heparin and given aspirin .  Also concern for possible PE.  She will have cardiology consultation as well as CT angio of her chest.  She will require admission.   Dasie Faden, MD 09/25/24 [phone]

## 2024-09-25 NOTE — Consult Note (Signed)
 Cardiology Consultation:   Patient ID: Melanie Evans; [id number]; [birth year]   Admit date: 09/25/2024 Date of Consult: 09/25/2024  Primary Care Provider: Okey Carlin Redbird, MD Primary Cardiologist: Redell Shallow, MD  Primary Electrophysiologist:  None   Patient Profile:   Melanie Evans is a 78 y.o. female with a hx of palpitations and elevated coronary calcium .  She had an echocardiogram few years ago that was unremarkable.  He has been managed medically for her elevated coronary calcium .  Who is being seen today for the evaluation of chest discomfort and shortness of breath at the request of Dr. Lou.  History of Present Illness:   Ms. Melanie Evans presented to the ED with complains of SOB.   She states she has gotten some chest discomfort off-and-on for about a week.  It was mild under the left arm.  However, it became much more severe last night.  It was under the left arm and radiating around to the left chest.  It was 8 out of 10 in intensity.  It was somewhat worse lying down better on the right side.  She took an inhaler and thought it would get better.  However, this morning it was not better so she went to [facility].  In the ER BNP was elevated at 5730.  Trop was 230 and relatively flat over 3 readings.  EKG demonstrated Sinus rhythm with deep T wave inversion in the inferior and lateral leads.  CT demonstrated no intrathoracic pathology with no PE.  There was coronary calcium  and aortic atherosclerosis.  She was started on IV heparin  and given ASA.    She otherwise has been doing well.  She does a little bit of exercising running on a small trampoline.  She lives in an apartment.  She does not typically get chest pressure, neck or arm discomfort.  She does not typically get shortness of breath, PND or orthopnea.  She has had no weight gain or edema.  Past Medical History:  Diagnosis Date   Allergy    Asthma    GERD (gastroesophageal reflux disease)     Hyperlipidemia    Hypertension    Nervous disorder     Past Surgical History:  Procedure Laterality Date   ABDOMINAL HYSTERECTOMY     TONSILLECTOMY       Home Medications:  Prior to Admission medications   Medication Sig Start Date End Date Taking? Authorizing Provider  azelastine (ASTELIN) 0.1 % nasal spray Place 1-2 sprays into both nostrils 2 (two) times daily. 08/26/24  Yes [provider]  albuterol  (PROVENTIL  HFA;VENTOLIN  HFA) 108 (90 Base) MCG/ACT inhaler Inhale 1-2 puffs into the lungs every 6 (six) hours as needed for wheezing or shortness of breath.    [provider]  Ascorbic Acid (VITAMIN C ER PO) Take by mouth.    [provider]  azelastine (OPTIVAR) 0.05 % ophthalmic solution INSTILL 1 DROP TO AFFECTED EYE(S) TWICE A DAY 07/05/18   [provider]  b complex vitamins capsule Take 1 capsule by mouth daily.    [provider]  betamethasone valerate ointment (VALISONE) 0.1 % betamethasone valerate 0.1 % topical ointment APPLY SPARINGLY TO AFFECTED AREA TWICE A DAY 09/23/20   [provider]  Cholecalciferol ([employer] VITAMIN D3 PO) Take 2,000 mcg by mouth.    [provider]  clobetasol cream (TEMOVATE) 0.05 % Apply topically. 08/17/23   [provider]  clonazePAM  (KLONOPIN ) 0.5 MG tablet TAKE 1-3 TABLETS BY MOUTH DAILY FOR  SLEEP IF NEEDED 08/22/24   Neysa Rama D, MD  Coenzyme Q10 (COQ10) 200 MG CAPS Take 1 capsule by mouth daily.    [provider]  Cyanocobalamin (VITAMIN B-12) 500 MCG SUBL Place 1 tablet under the tongue daily after lunch.    [provider]  EPIPEN 2-PAK 0.3 MG/0.3ML DEVI  10/08/12   [provider]  fluticasone (FLONASE) 50 MCG/ACT nasal spray Place 2 sprays into both nostrils daily.    [provider]  MAGNESIUM GLYCINATE PO Take 2 tablets by mouth.    [provider]  Multiple Vitamins-Minerals (ANTIOXIDANT PO) Take 2 capsules by mouth  daily.    [provider]  OMEGA 3 1000 MG CAPS Take 1 capsule by mouth daily.    [provider]  Probiotic Product (PROBIOTIC DAILY PO) Probiotic    [provider]  VITAMIN K PO Take by mouth.    [provider]  zaleplon  (SONATA ) 5 MG capsule 1 at night if needed for sleep 08/22/24   Neysa Rama D, MD    Inpatient Medications: Scheduled Meds:  sodium chloride flush  3 mL Intravenous Once   Continuous Infusions:  heparin 800 Units/hr (09/25/24 1223)   PRN Meds:   Allergies:    Allergies  Allergen Reactions   Cefzil [Cefprozil]    Erythromycin    Sulfa Antibiotics     Social History:   Social History   Socioeconomic History   Marital status: Married    Spouse name: Not on file   Number of children: 3   Years of education: Not on file   Highest education level: Not on file  Occupational History   Occupation: retired  Tobacco Use   Smoking status: Former    Types: Cigarettes   Smokeless tobacco: Never  Substance and Sexual Activity   Alcohol use: Yes    Comment: rare-wine   Drug use: No   Sexual activity: Not on file  Other Topics Concern   Not on file  Social History Narrative   Lives with husband   Social Drivers of Corporate Investment Banker Strain: Not on file  Food Insecurity: Not on file  Transportation Needs: Not on file  Physical Activity: Not on file  Stress: Not on file  Social Connections: Not on file  Intimate Partner Violence: Not on file    Family History:    Family History  Problem Relation Age of Onset   Lung cancer Mother    Asthma Mother    Allergies Mother    Cancer - Other Other    Ovarian cancer Other    Breast cancer Neg Hx      ROS:  Please see the history of present illness.  Positive for constipation All other ROS reviewed and negative.     Physical Exam/Data:   Vitals:   09/25/24 1402 09/25/24 1600 09/25/24 1730 09/25/24 1830  BP:  127/82 135/86 134/88  Pulse:  83 98 100   Resp:  16 19 19   Temp: 98.3 F (36.8 C)   98.3 F (36.8 C)  TempSrc: Oral   Oral  SpO2:  96% 96%   Weight:       No intake or output data in the 24 hours ending 09/25/24 1939 Filed Weights   09/25/24 0947  Weight: 67.1 kg   Body mass index is 24.25 kg/m.  GENERAL:  Well appearing HEENT:   Pupils equal round and reactive, fundi not visualized, oral mucosa unremarkable NECK:  No  jugular venous distention, waveform within normal limits, carotid upstroke brisk and symmetric, no bruits, no thyromegaly LYMPHATICS:  No cervical, inguinal adenopathy LUNGS:   Clear to auscultation bilaterally BACK:  No CVA tenderness CHEST:   Unremarkable HEART:  PMI not displaced or sustained,S1 and S2 within normal limits, positive S3, no S4, no clicks, no rubs, 2 out of 6 apical systolic murmur radiating slightly at the aortic outflow tract and soft diastolic murmurs ABD:  Flat, positive bowel sounds normal in frequency in pitch, no bruits, no rebound, no guarding, no midline pulsatile mass, no hepatomegaly, no splenomegaly EXT:  2 plus pulses throughout, no  edema, no cyanosis no clubbing SKIN:  No rashes no nodules NEURO:   Cranial nerves II through XII grossly intact, motor grossly intact throughout PSYCH:    Cognitively intact, oriented to person place and time   EKG:  The EKG was personally reviewed and demonstrates:  NSR with PACs.  , rate 74, axis left, LAFB, intervals WNL, T wave inversion in the inferior and lateral leads.  T wave inversion were new compared with 2024.    Telemetry:  Telemetry was personally reviewed and demonstrates:    Relevant CV Studies: None  Laboratory Data:  Chemistry Recent Labs  Lab 09/25/24 0956  NA 140  K 4.4  CL 103  CO2 27  GLUCOSE 95  BUN 15  CREATININE 0.81  CALCIUM  10.1  GFRNONAA >60  ANIONGAP 10    Recent Labs  Lab 09/25/24 0956  PROT 7.0  ALBUMIN 4.0  AST 36  ALT 25  ALKPHOS 95  BILITOT 1.0   Hematology Recent Labs  Lab  09/25/24 0951  WBC 7.6  RBC 4.04  HGB 12.8  HCT 38.1  MCV 94.3  MCH 31.7  MCHC 33.6  RDW 13.2  PLT 211   Cardiac EnzymesNo results for input(s): TROPONINI in the last 168 hours. No results for input(s): TROPIPOC in the last 168 hours.  BNP Recent Labs  Lab 09/25/24 0951  PROBNP 5,730.0*    DDimer No results for input(s): DDIMER in the last 168 hours.  Radiology/Studies:  CT Angio Chest PE W and/or Wo Contrast Result Date: 09/25/2024 CLINICAL DATA:  Concern for embolism. EXAM: CT ANGIOGRAPHY CHEST WITH CONTRAST TECHNIQUE: Multidetector CT imaging of the chest was performed using the standard protocol during bolus administration of intravenous contrast. Multiplanar CT image reconstructions and MIPs were obtained to evaluate the vascular anatomy. RADIATION DOSE REDUCTION: This exam was performed according to the departmental dose-optimization program which includes automated exposure control, adjustment of the mA and/or kV according to patient size and/or use of iterative reconstruction technique. CONTRAST:  75mL OMNIPAQUE  IOHEXOL  350 MG/ML SOLN COMPARISON:  Chest [place]  dated 09/25/2024 and CT dated 07/27/2022. FINDINGS: Cardiovascular: There is no cardiomegaly or pericardial effusion. There is 3 vessel coronary vascular calcification. Mild atherosclerotic calcification of the thoracic aorta. No aneurysmal dilatation. No pulmonary artery embolus identified. Mediastinum/Nodes: No hilar or mediastinal adenopathy. The esophagus is grossly unremarkable. No mediastinal fluid collection. Lungs/Pleura: No focal consolidation, pleural effusion, or pneumothorax. The central airways are patent. Upper Abdomen: No acute abnormality. Musculoskeletal: No acute osseous pathology. Chronic T8 compression fracture and vertebroplasty with extruded segment into the central canal. Review of the MIP images confirms the above findings. IMPRESSION: 1. No acute intrathoracic pathology. No CT evidence of  pulmonary artery embolus. 2. Three vessel coronary vascular calcification. 3.  Aortic Atherosclerosis (ICD10-I70.0). Electronically Signed   By: Vanetta Chou M.D.   On: 09/25/2024 13:25  DG Chest 2 View Result Date: 09/25/2024 CLINICAL DATA:  Left lateral chest/rib pain and cough which shortness-of-breath. EXAM: CHEST - 2 VIEW COMPARISON:  02/10/2020 FINDINGS: Lungs are adequately inflated without focal airspace consolidation or effusion. Cardiomediastinal silhouette is normal. Interval kyphoplasty of a midthoracic compression fracture. IMPRESSION: No acute cardiopulmonary disease. Electronically Signed   By: Toribio Agreste M.D.   On: 09/25/2024 11:01    Assessment and Plan:    Chest pain:   Her chest discomfort has nonanginal anginal features but her enzymes are elevated and her EKG markedly abnormal.  I would suspect Takotsubo's is a strong possibility.  She does have an S3 so I suspect her ejection fraction is reduced.  We will start with an echocardiogram.  She will need cardiac cath. The patient understands that risks included but are not limited to stroke (1 in 1000), death (1 in 1000), kidney failure [usually temporary] (1 in 500), bleeding (1 in 200), allergic reaction [possibly serious] (1 in 200).  The patient understands and agrees to proceed.   She can remain on heparin .  Dyslipidemia: I do not see that she is on statin and her LDL was 52.  No change in therapy.  Therapy can be based on cath results.   HTN:  No change in therapy tonight pending echo in the AM.     For questions or updates, please contact [facility] Please consult [email or web address] for contact info under Cardiology/STEMI.   Signed, Lynwood Schilling, MD  09/25/2024 7:39 PM

## 2024-09-25 NOTE — ED Notes (Signed)
 Report given to Corean, RN on 6E at [facility]

## 2024-09-26 ENCOUNTER — Encounter (HOSPITAL_COMMUNITY): Payer: Self-pay | Admitting: Internal Medicine

## 2024-09-26 ENCOUNTER — Encounter (HOSPITAL_COMMUNITY)
Admission: EM | Disposition: A | Discharge status: Home / Self Care | Payer: Self-pay | Source: Home / Self Care | Attending: Family Medicine

## 2024-09-26 ENCOUNTER — Inpatient Hospital Stay (HOSPITAL_COMMUNITY)

## 2024-09-26 DIAGNOSIS — I2510 Atherosclerotic heart disease of native coronary artery without angina pectoris: Secondary | ICD-10-CM | POA: Diagnosis not present

## 2024-09-26 DIAGNOSIS — I214 Non-ST elevation (NSTEMI) myocardial infarction: Secondary | ICD-10-CM | POA: Diagnosis not present

## 2024-09-26 HISTORY — PX: RIGHT/LEFT HEART CATH AND CORONARY ANGIOGRAPHY: CATH118266

## 2024-09-26 LAB — BASIC METABOLIC PANEL WITH GFR
Anion gap: 13 (ref 5–15)
BUN: 12 mg/dL (ref 8–23)
CO2: 23 mmol/L (ref 22–32)
Calcium: 9 mg/dL (ref 8.9–10.3)
Chloride: 102 mmol/L (ref 98–111)
Creatinine, Ser: 0.9 mg/dL (ref 0.44–1.00)
GFR, Estimated: >60 mL/min (ref >60)
Glucose, Bld: 90 mg/dL (ref 70–99)
Potassium: 3.9 mmol/L (ref 3.5–5.1)
Sodium: 138 mmol/L (ref 135–145)

## 2024-09-26 LAB — CBC
HCT: 40 % (ref 36.0–46.0)
Hemoglobin: 13.5 g/dL (ref 12.0–15.0)
MCH: 31.6 pg (ref 26.0–34.0)
MCHC: 33.8 g/dL (ref 30.0–36.0)
MCV: 93.7 fL (ref 80.0–100.0)
Platelets: 214 K/uL (ref 150–400)
RBC: 4.27 MIL/uL (ref 3.87–5.11)
RDW: 13.2 % (ref 11.5–15.5)
WBC: 6.7 K/uL (ref 4.0–10.5)
nRBC: 0 % (ref 0.0–0.2)

## 2024-09-26 LAB — HEPARIN LEVEL (UNFRACTIONATED): Heparin Unfractionated: 0.35 [IU]/mL (ref 0.30–0.70)

## 2024-09-26 LAB — POCT I-STAT EG7
Acid-Base Excess: 1 mmol/L (ref 0.0–2.0)
Acid-Base Excess: 1 mmol/L (ref 0.0–2.0)
Bicarbonate: 25.7 mmol/L (ref 20.0–28.0)
Bicarbonate: 26 mmol/L (ref 20.0–28.0)
Calcium, Ion: 1.22 mmol/L (ref 1.15–1.40)
Calcium, Ion: 1.22 mmol/L (ref 1.15–1.40)
HCT: 39 % (ref 36.0–46.0)
HCT: 40 % (ref 36.0–46.0)
Hemoglobin: 13.3 g/dL (ref 12.0–15.0)
Hemoglobin: 13.6 g/dL (ref 12.0–15.0)
O2 Saturation: 66 %
O2 Saturation: 69 %
Potassium: 3.9 mmol/L (ref 3.5–5.1)
Potassium: 3.9 mmol/L (ref 3.5–5.1)
Sodium: 139 mmol/L (ref 135–145)
Sodium: 139 mmol/L (ref 135–145)
TCO2: 27 mmol/L (ref 22–32)
TCO2: 27 mmol/L (ref 22–32)
pCO2, Ven: 41.5 mmHg — ABNORMAL LOW (ref 44–60)
pCO2, Ven: 42.4 mmHg — ABNORMAL LOW (ref 44–60)
pH, Ven: 7.396 (ref 7.25–7.43)
pH, Ven: 7.4 (ref 7.25–7.43)
pO2, Ven: 35 mmHg (ref 32–45)
pO2, Ven: 36 mmHg (ref 32–45)

## 2024-09-26 LAB — POCT I-STAT 7, (LYTES, BLD GAS, ICA,H+H)
Acid-Base Excess: 0 mmol/L (ref 0.0–2.0)
Bicarbonate: 23.6 mmol/L (ref 20.0–28.0)
Calcium, Ion: 1.2 mmol/L (ref 1.15–1.40)
HCT: 39 % (ref 36.0–46.0)
Hemoglobin: 13.3 g/dL (ref 12.0–15.0)
O2 Saturation: 97 %
Potassium: 3.9 mmol/L (ref 3.5–5.1)
Sodium: 139 mmol/L (ref 135–145)
TCO2: 25 mmol/L (ref 22–32)
pCO2 arterial: 34 mmHg (ref 32–48)
pH, Arterial: 7.449 (ref 7.35–7.45)
pO2, Arterial: 83 mmHg (ref 83–108)

## 2024-09-26 LAB — ECHOCARDIOGRAM COMPLETE
Area-P 1/2: 3.99 cm2
Est EF: 35
Height: 65 in
S' Lateral: 2.6 cm
Single Plane A4C EF: 50.2 %
Weight: 2368 [oz_av]

## 2024-09-26 LAB — LIPID PANEL
Cholesterol: 275 mg/dL — ABNORMAL HIGH (ref 0–200)
HDL: 76 mg/dL (ref >40)
LDL Cholesterol: 190 mg/dL — ABNORMAL HIGH (ref 0–99)
Total CHOL/HDL Ratio: 3.6 ratio
Triglycerides: 45 mg/dL (ref <150)
VLDL: 9 mg/dL (ref 0–40)

## 2024-09-26 LAB — MAGNESIUM: Magnesium: 1.8 mg/dL (ref 1.7–2.4)

## 2024-09-26 LAB — GLUCOSE, CAPILLARY: Glucose-Capillary: 96 mg/dL (ref 70–99)

## 2024-09-26 LAB — TROPONIN I (HIGH SENSITIVITY): Troponin I (High Sensitivity): 853 ng/L (ref <18)

## 2024-09-26 SURGERY — RIGHT/LEFT HEART CATH AND CORONARY ANGIOGRAPHY
Anesthesia: LOCAL

## 2024-09-26 MED ORDER — MIDAZOLAM HCL 2 MG/2ML IJ SOLN
INTRAMUSCULAR | Status: AC
  Filled 2024-09-26: qty 2

## 2024-09-26 MED ORDER — HYDRALAZINE HCL 20 MG/ML IJ SOLN: 10.0000 mg | INTRAMUSCULAR | Status: AC | PRN

## 2024-09-26 MED ORDER — LABETALOL HCL 5 MG/ML IV SOLN: 10.0000 mg | INTRAVENOUS | Status: AC | PRN

## 2024-09-26 MED ORDER — SODIUM CHLORIDE 0.9% FLUSH
3.0000 mL | INTRAVENOUS | Status: DC | PRN
Start: 2024-09-26 — End: 2024-09-27

## 2024-09-26 MED ORDER — MIDAZOLAM HCL (PF) 2 MG/2ML IJ SOLN
INTRAMUSCULAR | Status: DC | PRN
  Administered 2024-09-26 (×2): 1 mg via INTRAVENOUS

## 2024-09-26 MED ORDER — VERAPAMIL HCL 2.5 MG/ML IV SOLN
INTRAVENOUS | Status: AC
  Filled 2024-09-26: qty 2

## 2024-09-26 MED ORDER — HEPARIN SODIUM (PORCINE) 1000 UNIT/ML IJ SOLN
INTRAMUSCULAR | Status: AC
  Filled 2024-09-26: qty 10

## 2024-09-26 MED ORDER — HEPARIN (PORCINE) IN NACL 2-0.9 UNITS/ML
INTRAMUSCULAR | Status: DC | PRN
  Administered 2024-09-26: 10 mL via INTRA_ARTERIAL

## 2024-09-26 MED ORDER — ROSUVASTATIN CALCIUM 5 MG PO TABS
10.0000 mg | ORAL_TABLET | Freq: Every day | ORAL | Status: DC
  Administered 2024-09-26 – 2024-09-27 (×2): 10 mg via ORAL
  Filled 2024-09-26 (×2): qty 2

## 2024-09-26 MED ORDER — LIDOCAINE HCL (PF) 1 % IJ SOLN
INTRAMUSCULAR | Status: DC | PRN
Start: 2024-09-26 — End: 2024-09-26
  Administered 2024-09-26: 2 mL

## 2024-09-26 MED ORDER — HEPARIN SODIUM (PORCINE) 1000 UNIT/ML IJ SOLN
INTRAMUSCULAR | Status: DC | PRN
Start: 2024-09-26 — End: 2024-09-26
  Administered 2024-09-26: 3500 [IU] via INTRAVENOUS

## 2024-09-26 MED ORDER — SODIUM CHLORIDE 0.9 % IV SOLN: 250.0000 mL | INTRAVENOUS | Status: DC | PRN

## 2024-09-26 MED ORDER — METOPROLOL TARTRATE 12.5 MG HALF TABLET
12.5000 mg | ORAL_TABLET | Freq: Two times a day (BID) | ORAL | Status: DC
  Administered 2024-09-26 (×2): 12.5 mg via ORAL
  Filled 2024-09-26 (×2): qty 1

## 2024-09-26 MED ORDER — IOHEXOL 350 MG/ML SOLN
INTRAVENOUS | Status: DC | PRN
Start: 2024-09-26 — End: 2024-09-26
  Administered 2024-09-26: 60 mL

## 2024-09-26 MED ORDER — HEPARIN (PORCINE) IN NACL 1000-0.9 UT/500ML-% IV SOLN
INTRAVENOUS | Status: DC | PRN
  Administered 2024-09-26 (×2): 500 mL

## 2024-09-26 MED ORDER — LIDOCAINE HCL (PF) 1 % IJ SOLN
INTRAMUSCULAR | Status: AC
Start: 2024-09-26 — End: 2024-09-26
  Filled 2024-09-26: qty 30

## 2024-09-26 MED ORDER — FENTANYL CITRATE (PF) 100 MCG/2ML IJ SOLN
INTRAMUSCULAR | Status: DC | PRN
  Administered 2024-09-26 (×2): 25 ug via INTRAVENOUS

## 2024-09-26 MED ORDER — FREE WATER: 250.0000 mL | Freq: Once | Status: DC

## 2024-09-26 MED ORDER — FENTANYL CITRATE (PF) 100 MCG/2ML IJ SOLN
INTRAMUSCULAR | Status: AC
  Filled 2024-09-26: qty 2

## 2024-09-26 MED ORDER — SODIUM CHLORIDE 0.9% FLUSH
3.0000 mL | Freq: Two times a day (BID) | INTRAVENOUS | Status: DC
  Administered 2024-09-26 – 2024-09-27 (×2): 3 mL via INTRAVENOUS

## 2024-09-26 MED ORDER — VERAPAMIL HCL 2.5 MG/ML IV SOLN
INTRAVENOUS | Status: DC | PRN
  Administered 2024-09-26: 10 mL via INTRA_ARTERIAL

## 2024-09-26 MED ORDER — ENOXAPARIN SODIUM 40 MG/0.4ML IJ SOSY
40.0000 mg | PREFILLED_SYRINGE | INTRAMUSCULAR | Status: DC
  Administered 2024-09-27: 40 mg via SUBCUTANEOUS
  Filled 2024-09-26: qty 0.4

## 2024-09-26 SURGICAL SUPPLY — 11 items
CATH BALLN WEDGE 5F 110CM (CATHETERS) IMPLANT
CATH INFINITI 5 FR JL3.5 (CATHETERS) IMPLANT
CATH INFINITI 5FR ANG PIGTAIL (CATHETERS) IMPLANT
CATH INFINITI JR4 5F (CATHETERS) IMPLANT
DEVICE RAD TR BAND REGULAR (VASCULAR PRODUCTS) IMPLANT
GLIDESHEATH SLEND SS 6F .021 (SHEATH) IMPLANT
GUIDEWIRE INQWIRE 1.5J.035X260 (WIRE) IMPLANT
PACK CARDIAC CATHETERIZATION (CUSTOM PROCEDURE TRAY) ×1 IMPLANT
SET ATX-X65L (MISCELLANEOUS) IMPLANT
SHEATH GLIDE SLENDER 4/5FR (SHEATH) IMPLANT
SHEATH PROBE COVER 6X72 (BAG) IMPLANT

## 2024-09-26 NOTE — Progress Notes (Signed)
 PHARMACY - ANTICOAGULATION CONSULT NOTE  Pharmacy Consult for heparin Indication: chest pain/ACS  Allergies  Allergen Reactions   Cefzil [Cefprozil]    Erythromycin    Sulfa Antibiotics     Patient Measurements: Weight: 67.1 kg (148 lb)  Vital Signs: Temp: 98.2 F (36.8 C) (11/20 0405) Temp Source: Oral (11/20 0405) BP: 130/73 (11/20 0904) Pulse Rate: 92 (11/20 0904)  Labs: Recent Labs    09/25/24 0951 09/25/24 0956 09/25/24 2025 09/25/24 2026 09/25/24 2315 09/26/24 0403 09/26/24 0713  HGB 12.8  --   --   --   --  13.5  --   HCT 38.1  --   --   --   --  40.0  --   PLT 211  --   --   --   --  214  --   LABPROT  --  12.3  --   --   --   --   --   INR  --  0.9  --   --   --   --   --   HEPARINUNFRC  --   --   --  0.38  --   --  0.35  CREATININE  --  0.81  --   --   --  0.90  --   TROPONINIHS  --   --  900*  --  853*  --   --     Estimated Creatinine Clearance: 45.8 mL/min (by C-G formula based on SCr of 0.9 mg/dL).   Medical History: Past Medical History:  Diagnosis Date   Allergy    Asthma    GERD (gastroesophageal reflux disease)    Hyperlipidemia    Hypertension    Nervous disorder     Medications:  Infusions:   heparin 800 Units/hr (09/25/24 1223)    Assessment: 78 yof presented to the ED with ShOB. Troponin elevated and now starting IV heparin. Baseline CBC is WNL and she is not on anticoagulation PTA.   Repeat heparin level remains therapeutic on 800 units/h. Cardiac cath later today.  Goal of Therapy:  Heparin level 0.3-0.7 units/ml Monitor platelets by anticoagulation protocol: Yes   Plan:  Continue Heparin gtt 800 units/hr F/U [facility] plans post/cath  Ozell Jamaica, PharmD, BCPS, [facility] Clinical Pharmacist [phone] Please check AMION for all [facility] Pharm[facility] numbers 09/26/2024

## 2024-09-26 NOTE — Progress Notes (Signed)
 Rounding Note   Patient Name: Melanie Evans Date of Encounter: 09/26/2024  [facility] Cardiologist: Redell Shallow, MD   Subjective No CP or dyspnea  Scheduled Meds:  clonazePAM   0.5 mg Oral QHS   free water  250 mL Oral Once   sodium chloride flush  3 mL Intravenous Once   Continuous Infusions:  heparin 800 Units/hr (09/25/24 1223)   PRN Meds: acetaminophen **OR** acetaminophen, bisacodyl, fluticasone, ipratropium-albuterol, nitroGLYCERIN, senna-docusate   Vital Signs  Vitals:   09/25/24 1830 09/25/24 2100 09/25/24 2349 09/26/24 0405  BP: 134/88 136/79 119/74 (!) 145/78  Pulse: 100 79 71 73  Resp: 19 16 16 18   Temp: 98.3 F (36.8 C) 97.8 F (36.6 C) 97.8 F (36.6 C) 98.2 F (36.8 C)  TempSrc: Oral Axillary Oral Oral  SpO2:  97% 96% 100%  Weight:        Intake/Output Summary (Last 24 hours) at 09/26/2024 0736 Last data filed at 09/25/2024 2200 Gross per 24 hour  Intake 240 ml  Output --  Net 240 ml      09/25/2024    9:47 AM 08/22/2024   10:07 AM 06/17/2024    4:12 PM  Last 3 Weights  Weight (lbs) 148 lb 151 lb 145 lb  Weight (kg) 67.132 kg 68.493 kg 65.772 kg      Telemetry Sinus with PACs and PVCs- Personally Reviewed   Physical Exam  GEN: No acute distress.   Neck: No JVD Cardiac: RRR, no murmurs, rubs, or gallops.  Respiratory: Clear to auscultation bilaterally. GI: Soft, nontender, non-distended  MS: No edema Neuro:  Nonfocal  Psych: Normal affect   Labs High Sensitivity Troponin:   Recent Labs  Lab 09/25/24 2025 09/25/24 2315  TROPONINIHS 900* 853*     Chemistry Recent Labs  Lab 09/25/24 0956 09/26/24 0403  NA 140 138  K 4.4 3.9  CL 103 102  CO2 27 23  GLUCOSE 95 90  BUN 15 12  CREATININE 0.81 0.90  CALCIUM  10.1 9.0  MG  --  1.8  PROT 7.0  --   ALBUMIN 4.0  --   AST 36  --   ALT 25  --   ALKPHOS 95  --   BILITOT 1.0  --   GFRNONAA >60 >60  ANIONGAP 10 13    Lipids  Recent Labs  Lab  09/26/24 0403  CHOL 275*  TRIG 45  HDL 76  LDLCALC 190*  CHOLHDL 3.6    Hematology Recent Labs  Lab 09/25/24 0951 09/26/24 0403  WBC 7.6 6.7  RBC 4.04 4.27  HGB 12.8 13.5  HCT 38.1 40.0  MCV 94.3 93.7  MCH 31.7 31.6  MCHC 33.6 33.8  RDW 13.2 13.2  PLT 211 214    BNP Recent Labs  Lab 09/25/24 0951  PROBNP 5,730.0*     Radiology  CT Angio Chest PE W and/or Wo Contrast Result Date: 09/25/2024 CLINICAL DATA:  Concern for embolism. EXAM: CT ANGIOGRAPHY CHEST WITH CONTRAST TECHNIQUE: Multidetector CT imaging of the chest was performed using the standard protocol during bolus administration of intravenous contrast. Multiplanar CT image reconstructions and MIPs were obtained to evaluate the vascular anatomy. RADIATION DOSE REDUCTION: This exam was performed according to the departmental dose-optimization program which includes automated exposure control, adjustment of the mA and/or kV according to patient size and/or use of iterative reconstruction technique. CONTRAST:  75mL OMNIPAQUE  IOHEXOL  350 MG/ML SOLN COMPARISON:  Chest [place]  dated 09/25/2024 and CT dated 07/27/2022. FINDINGS: Cardiovascular: There  is no cardiomegaly or pericardial effusion. There is 3 vessel coronary vascular calcification. Mild atherosclerotic calcification of the thoracic aorta. No aneurysmal dilatation. No pulmonary artery embolus identified. Mediastinum/Nodes: No hilar or mediastinal adenopathy. The esophagus is grossly unremarkable. No mediastinal fluid collection. Lungs/Pleura: No focal consolidation, pleural effusion, or pneumothorax. The central airways are patent. Upper Abdomen: No acute abnormality. Musculoskeletal: No acute osseous pathology. Chronic T8 compression fracture and vertebroplasty with extruded segment into the central canal. Review of the MIP images confirms the above findings. IMPRESSION: 1. No acute intrathoracic pathology. No CT evidence of pulmonary artery embolus. 2. Three vessel  coronary vascular calcification. 3.  Aortic Atherosclerosis (ICD10-I70.0). Electronically Signed   By: Vanetta Chou M.D.   On: 09/25/2024 13:25   DG Chest 2 View Result Date: 09/25/2024 CLINICAL DATA:  Left lateral chest/rib pain and cough which shortness-of-breath. EXAM: CHEST - 2 VIEW COMPARISON:  02/10/2020 FINDINGS: Lungs are adequately inflated without focal airspace consolidation or effusion. Cardiomediastinal silhouette is normal. Interval kyphoplasty of a midthoracic compression fracture. IMPRESSION: No acute cardiopulmonary disease. Electronically Signed   By: Toribio Agreste M.D.   On: 09/25/2024 11:01     Patient Profile   78 y.o. female with past medical history of hypertension, hyperlipidemia, elevated calcium  score and palpitations admitted with NSTEMI (elevated troponin and anterolateral/inferior T wave inversion).  CTA shows no pulmonary embolus and three-vessel coronary calcification.  Assessment & Plan  1 non-ST elevation myocardial infarction-patient is pain-free this morning.  Plan to continue aspirin  and heparin.  She is willing to try statin therapy at low-dose.  Add metoprolol 12.5 mg twice daily.  Schedule echocardiogram to assess LV function.  Proceed with cardiac catheterization today.  The risk and benefits including myocardial infarction, CVA and death discussed and she agrees to proceed.  2 hyperlipidemia-LDL today is 190.  She previously refused statin therapy.  She is willing to try low-dose and if she does not tolerate will refer for PCSK9 inhibitor.  Note she does have coronary calcification on her CT and on previous calcium  score.  3 hypertension-Will add low-dose metoprolol and transition to Toprol at discharge.  If LV function reduced will also add ARB or Entresto.  For questions or updates, please contact [facility] Please consult [email or web address] for contact info under       Signed, Redell Shallow, MD  09/26/2024, 7:36 AM

## 2024-09-26 NOTE — Interval H&P Note (Signed)
 History and Physical Interval Note:  09/26/2024 5:36 PM  Melanie Evans  has presented today for surgery, with the diagnosis of NSTEMI.  The various methods of treatment have been discussed with the patient and family. After consideration of risks, benefits and other options for treatment, the patient has consented to  Procedure(s): RIGHT/LEFT HEART CATH AND CORONARY ANGIOGRAPHY (N/A) as a surgical intervention.  The patient's history has been reviewed, patient examined, no change in status, stable for surgery.  I have reviewed the patient's chart and labs.  Questions were answered to the patient's satisfaction.    Cath Lab Visit (complete for each Cath Lab visit)  Clinical Evaluation Leading to the Procedure:   ACS: Yes.    Non-ACS:  N/A  Aliegha Paullin

## 2024-09-26 NOTE — Plan of Care (Signed)

## 2024-09-26 NOTE — Progress Notes (Signed)
 PROGRESS NOTE    Melanie Evans  FMW:[id number] DOB: [birth year] DOA: 09/25/2024 PCP: Okey Carlin Redbird, MD   Brief Narrative:  HPI: Melanie Evans is a 78 y.o. female with medical history significant for allergies, asthma, HTN, HLD, palpitations and anxiety who presented to the [facility] for evaluation of shortness of breath and chest pain. Patient reports she has been dealing with allergies symptoms over the last few weeks. She has also had intermittent chest discomfort for about a week. Last night, she had worsening of her left-sided chest pain described as pressure-like but associated shortness of breath and cough.  She used her home inhaler without any relief so she presented to the ED for further evaluation. She endorsed intermittent palpitations but denies any orthopnea, PND, leg swelling, abdominal pain, nausea, vomiting or diaphoresis.  She has been under slight stress since moving to a small apartment with her husband however no other significant stressors.   [facility] Course: Initial vitals show patient afebrile, HR 80-100, SBP 120-160s, SpO2 96% on room air. Initial labs significant for proBNP 5730, troponin 230->198->223. EKG shows sinus rhythm with APCs, LAFB, prolonged QTc of 499 and T wave inversion in the inferior lateral leads. CXR shows no active disease. CTA chest PE study negative for PE but shows three-vessel coronary vascular calcification. Pt received aspirin  324 mg x 1 and started on IV heparin. Cardiology was consulted for evaluation. Patient was admitted to TRH service and transferred to [facility].   Assessment & Plan:   Principal Problem:   NSTEMI (non-ST elevated myocardial infarction) [facility]) Active Problems:   Essential hypertension   Hyperlipidemia   Chronic asthma without complication   Anxiety   Elevated troponin  NSTEMI / Elevated troponin Presented with typical chest pain, troponin 853> 900.  Seen by cardiology, started on heparin drip.   Plan for cardiac cath.  They recommended echo, will order that.  Management per cardiology.     # HTN Does not appear to be on any antihypertensives PTA.  Currently blood pressure within normal range.   # HLD: Cholesterol 275, LDL 190.  Will start on high intensity statin post cath.   # Prolonged QTc - Initial EKG showed prolonged QTc of 499 - Avoid QTc prolonging meds   # Anxiety - Continue Klonopin  at bedtime   # Asthma - Chronic and stable, no signs of acute exacerbation - As needed DuoNeb   # Allergies - Continue as needed Flonase  DVT prophylaxis: Heparin   Code Status: Full Code  Family Communication: Husband present at bedside.  Plan of care discussed with patient in length and he/she verbalized understanding and agreed with it.  Status is: Inpatient Remains inpatient appropriate because: Cath today   Estimated body mass index is 24.25 kg/m as calculated from the following:   Height as of 08/22/24: 5' 5.5 (1.664 m).   Weight as of this encounter: 67.1 kg.    Nutritional Assessment: Body mass index is 24.25 kg/m.[name] Seen by dietician.  I agree with the assessment and plan as outlined below: Nutrition Status:        . Skin Assessment: I have examined the patient's skin and I agree with the wound assessment as performed by the wound care RN as outlined below:    Consultants:  Cardiology  Procedures:  As above  Antimicrobials:  Anti-infectives (From admission, onward)    None         Subjective: Patient seen and examined, husband at bedside.  Patient  is chest pain-free, no other complaint.  She is aware of the plan of the cardiac catheter today.  Objective: Vitals:   09/25/24 1830 09/25/24 2100 09/25/24 2349 09/26/24 0405  BP: 134/88 136/79 119/74 (!) 145/78  Pulse: 100 79 71 73  Resp: 19 16 16 18   Temp: 98.3 F (36.8 C) 97.8 F (36.6 C) 97.8 F (36.6 C) 98.2 F (36.8 C)  TempSrc: Oral Axillary Oral Oral  SpO2:  97% 96% 100%  Weight:         Intake/Output Summary (Last 24 hours) at 09/26/2024 0739 Last data filed at 09/25/2024 2200 Gross per 24 hour  Intake 240 ml  Output --  Net 240 ml   Filed Weights   09/25/24 0947  Weight: 67.1 kg    Examination:  General exam: Appears calm and comfortable  Respiratory system: Clear to auscultation. Respiratory effort normal. Cardiovascular system: S1 & S2 heard, RRR. No JVD, murmurs, rubs, gallops or clicks. No pedal edema. Gastrointestinal system: Abdomen is nondistended, soft and nontender. No organomegaly or masses felt. Normal bowel sounds heard. Central nervous system: Alert and oriented. No focal neurological deficits. Extremities: Symmetric 5 x 5 power. Skin: No rashes, lesions or ulcers Psychiatry: Judgement and insight appear normal. Mood & affect appropriate.    Data Reviewed: I have personally reviewed following labs and imaging studies  CBC: Recent Labs  Lab 09/25/24 0951 09/26/24 0403  WBC 7.6 6.7  NEUTROABS 5.3  --   HGB 12.8 13.5  HCT 38.1 40.0  MCV 94.3 93.7  PLT 211 214   Basic Metabolic Panel: Recent Labs  Lab 09/25/24 0956 09/26/24 0403  NA 140 138  K 4.4 3.9  CL 103 102  CO2 27 23  GLUCOSE 95 90  BUN 15 12  CREATININE 0.81 0.90  CALCIUM  10.1 9.0  MG  --  1.8   GFR: Estimated Creatinine Clearance: 45.8 mL/min (by C-G formula based on SCr of 0.9 mg/dL). Liver Function Tests: Recent Labs  Lab 09/25/24 0956  AST 36  ALT 25  ALKPHOS 95  BILITOT 1.0  PROT 7.0  ALBUMIN 4.0   No results for input(s): LIPASE, AMYLASE in the last 168 hours. No results for input(s): AMMONIA in the last 168 hours. Coagulation Profile: Recent Labs  Lab 09/25/24 0956  INR 0.9   Cardiac Enzymes: No results for input(s): CKTOTAL, CKMB, CKMBINDEX, TROPONINI in the last 168 hours. BNP (last 3 results) Recent Labs    09/25/24 0951  PROBNP 5,730.0*   HbA1C: No results for input(s): HGBA1C in the last 72 hours. CBG: Recent  Labs  Lab 09/25/24 1827  GLUCAP 114*   Lipid Profile: Recent Labs    09/26/24 0403  CHOL 275*  HDL 76  LDLCALC 190*  TRIG 45  CHOLHDL 3.6   Thyroid  Function Tests: No results for input(s): TSH, T4TOTAL, FREET4, T3FREE, THYROIDAB in the last 72 hours. Anemia Panel: No results for input(s): VITAMINB12, FOLATE, FERRITIN, TIBC, IRON, RETICCTPCT in the last 72 hours. Sepsis Labs: No results for input(s): PROCALCITON, LATICACIDVEN in the last 168 hours.  Recent Results (from the past 240 hours)  Resp panel by RT-PCR (RSV, Flu A&B, Covid) Anterior Nasal Swab     Status: None   Collection Time: 09/25/24 10:47 AM   Specimen: Anterior Nasal Swab  Result Value Ref Range Status   SARS Coronavirus 2 by RT PCR NEGATIVE NEGATIVE Final    Comment: (NOTE) SARS-CoV-2 target nucleic acids are NOT DETECTED.  The SARS-CoV-2 RNA is generally  detectable in upper respiratory specimens during the acute phase of infection. The lowest concentration of SARS-CoV-2 viral copies this assay can detect is 138 copies/mL. A negative result does not preclude SARS-Cov-2 infection and should not be used as the sole basis for treatment or other patient management decisions. A negative result may occur with  improper specimen collection/handling, submission of specimen other than nasopharyngeal swab, presence of viral mutation(s) within the areas targeted by this assay, and inadequate number of viral copies(<138 copies/mL). A negative result must be combined with clinical observations, patient history, and epidemiological information. The expected result is Negative.  Fact Sheet for Patients:  bloggercourse.com  Fact Sheet for Healthcare Providers:  seriousbroker.it  This test is no t yet approved or cleared by the [place]  FDA and  has been authorized for detection and/or diagnosis of SARS-CoV-2 by FDA under an Emergency Use  Authorization (EUA). This EUA will remain  in effect (meaning this test can be used) for the duration of the COVID-19 declaration under Section 564(b)(1) of the Act, 21 U.S.C.section 360bbb-3(b)(1), unless the authorization is terminated  or revoked sooner.       Influenza A by PCR NEGATIVE NEGATIVE Final   Influenza B by PCR NEGATIVE NEGATIVE Final    Comment: (NOTE) The Xpert Xpress SARS-CoV-2/FLU/RSV plus assay is intended as an aid in the diagnosis of influenza from Nasopharyngeal swab specimens and should not be used as a sole basis for treatment. Nasal washings and aspirates are unacceptable for Xpert Xpress SARS-CoV-2/FLU/RSV testing.  Fact Sheet for Patients: bloggercourse.com  Fact Sheet for Healthcare Providers: seriousbroker.it  This test is not yet approved or cleared by the [place]  FDA and has been authorized for detection and/or diagnosis of SARS-CoV-2 by FDA under an Emergency Use Authorization (EUA). This EUA will remain in effect (meaning this test can be used) for the duration of the COVID-19 declaration under Section 564(b)(1) of the Act, 21 U.S.C. section 360bbb-3(b)(1), unless the authorization is terminated or revoked.     Resp Syncytial Virus by PCR NEGATIVE NEGATIVE Final    Comment: (NOTE) Fact Sheet for Patients: bloggercourse.com  Fact Sheet for Healthcare Providers: seriousbroker.it  This test is not yet approved or cleared by the [place]  FDA and has been authorized for detection and/or diagnosis of SARS-CoV-2 by FDA under an Emergency Use Authorization (EUA). This EUA will remain in effect (meaning this test can be used) for the duration of the COVID-19 declaration under Section 564(b)(1) of the Act, 21 U.S.C. section 360bbb-3(b)(1), unless the authorization is terminated or revoked.  Performed at [facility], [address], [place], [place] [address]      Radiology Studies: CT Angio Chest PE W and/or Wo Contrast Result Date: 09/25/2024 CLINICAL DATA:  Concern for embolism. EXAM: CT ANGIOGRAPHY CHEST WITH CONTRAST TECHNIQUE: Multidetector CT imaging of the chest was performed using the standard protocol during bolus administration of intravenous contrast. Multiplanar CT image reconstructions and MIPs were obtained to evaluate the vascular anatomy. RADIATION DOSE REDUCTION: This exam was performed according to the departmental dose-optimization program which includes automated exposure control, adjustment of the mA and/or kV according to patient size and/or use of iterative reconstruction technique. CONTRAST:  75mL OMNIPAQUE  IOHEXOL  350 MG/ML SOLN COMPARISON:  Chest [place]  dated 09/25/2024 and CT dated 07/27/2022. FINDINGS: Cardiovascular: There is no cardiomegaly or pericardial effusion. There is 3 vessel coronary vascular calcification. Mild atherosclerotic calcification of the thoracic aorta. No aneurysmal dilatation. No pulmonary artery embolus identified. Mediastinum/Nodes: No hilar  or mediastinal adenopathy. The esophagus is grossly unremarkable. No mediastinal fluid collection. Lungs/Pleura: No focal consolidation, pleural effusion, or pneumothorax. The central airways are patent. Upper Abdomen: No acute abnormality. Musculoskeletal: No acute osseous pathology. Chronic T8 compression fracture and vertebroplasty with extruded segment into the central canal. Review of the MIP images confirms the above findings. IMPRESSION: 1. No acute intrathoracic pathology. No CT evidence of pulmonary artery embolus. 2. Three vessel coronary vascular calcification. 3.  Aortic Atherosclerosis (ICD10-I70.0). Electronically Signed   By: Vanetta Chou M.D.   On: 09/25/2024 13:25   DG Chest 2 View Result Date: 09/25/2024 CLINICAL DATA:  Left lateral chest/rib pain and cough which shortness-of-breath. EXAM: CHEST - 2  VIEW COMPARISON:  02/10/2020 FINDINGS: Lungs are adequately inflated without focal airspace consolidation or effusion. Cardiomediastinal silhouette is normal. Interval kyphoplasty of a midthoracic compression fracture. IMPRESSION: No acute cardiopulmonary disease. Electronically Signed   By: Toribio Agreste M.D.   On: 09/25/2024 11:01    Scheduled Meds:  clonazePAM   0.5 mg Oral QHS   free water   250 mL Oral Once   sodium chloride  flush  3 mL Intravenous Once   Continuous Infusions:  heparin  800 Units/hr (09/25/24 1223)     LOS: 1 day   Fredia Skeeter, MD [facility]  09/26/2024, 7:39 AM   *Please note that this is a verbal dictation therefore any spelling or grammatical errors are due to the Dragon Medical One system interpretation.  Please page via Amion and do not message via secure chat for urgent patient care matters. Secure chat can be used for non urgent patient care matters.  How to contact the TRH Attending or Consulting provider 7A - 7P or covering provider during after hours 7P -7A, for this patient?  Check the care team in [facility] and look for a) attending/consulting TRH provider listed and b) the TRH team listed. Page or secure chat 7A-7P. Log into [email or web address] and use [place]'s universal password to access. If you do not have the password, please contact the hospital operator. Locate the TRH provider you are looking for under [facility] and page to a number that you can be directly reached. If you still have difficulty reaching the provider, please page the [facility] (Director on Call) for the Hospitalists listed on amion for assistance.

## 2024-09-26 NOTE — H&P (View-Only) (Signed)
 Rounding Note   Patient Name: Melanie Evans Date of Encounter: 09/26/2024  [facility] Cardiologist: Redell Shallow, MD   Subjective No CP or dyspnea  Scheduled Meds:  clonazePAM   0.5 mg Oral QHS   free water  250 mL Oral Once   sodium chloride flush  3 mL Intravenous Once   Continuous Infusions:  heparin 800 Units/hr (09/25/24 1223)   PRN Meds: acetaminophen **OR** acetaminophen, bisacodyl, fluticasone, ipratropium-albuterol, nitroGLYCERIN, senna-docusate   Vital Signs  Vitals:   09/25/24 1830 09/25/24 2100 09/25/24 2349 09/26/24 0405  BP: 134/88 136/79 119/74 (!) 145/78  Pulse: 100 79 71 73  Resp: 19 16 16 18   Temp: 98.3 F (36.8 C) 97.8 F (36.6 C) 97.8 F (36.6 C) 98.2 F (36.8 C)  TempSrc: Oral Axillary Oral Oral  SpO2:  97% 96% 100%  Weight:        Intake/Output Summary (Last 24 hours) at 09/26/2024 0736 Last data filed at 09/25/2024 2200 Gross per 24 hour  Intake 240 ml  Output --  Net 240 ml      09/25/2024    9:47 AM 08/22/2024   10:07 AM 06/17/2024    4:12 PM  Last 3 Weights  Weight (lbs) 148 lb 151 lb 145 lb  Weight (kg) 67.132 kg 68.493 kg 65.772 kg      Telemetry Sinus with PACs and PVCs- Personally Reviewed   Physical Exam  GEN: No acute distress.   Neck: No JVD Cardiac: RRR, no murmurs, rubs, or gallops.  Respiratory: Clear to auscultation bilaterally. GI: Soft, nontender, non-distended  MS: No edema Neuro:  Nonfocal  Psych: Normal affect   Labs High Sensitivity Troponin:   Recent Labs  Lab 09/25/24 2025 09/25/24 2315  TROPONINIHS 900* 853*     Chemistry Recent Labs  Lab 09/25/24 0956 09/26/24 0403  NA 140 138  K 4.4 3.9  CL 103 102  CO2 27 23  GLUCOSE 95 90  BUN 15 12  CREATININE 0.81 0.90  CALCIUM  10.1 9.0  MG  --  1.8  PROT 7.0  --   ALBUMIN 4.0  --   AST 36  --   ALT 25  --   ALKPHOS 95  --   BILITOT 1.0  --   GFRNONAA >60 >60  ANIONGAP 10 13    Lipids  Recent Labs  Lab  09/26/24 0403  CHOL 275*  TRIG 45  HDL 76  LDLCALC 190*  CHOLHDL 3.6    Hematology Recent Labs  Lab 09/25/24 0951 09/26/24 0403  WBC 7.6 6.7  RBC 4.04 4.27  HGB 12.8 13.5  HCT 38.1 40.0  MCV 94.3 93.7  MCH 31.7 31.6  MCHC 33.6 33.8  RDW 13.2 13.2  PLT 211 214    BNP Recent Labs  Lab 09/25/24 0951  PROBNP 5,730.0*     Radiology  CT Angio Chest PE W and/or Wo Contrast Result Date: 09/25/2024 CLINICAL DATA:  Concern for embolism. EXAM: CT ANGIOGRAPHY CHEST WITH CONTRAST TECHNIQUE: Multidetector CT imaging of the chest was performed using the standard protocol during bolus administration of intravenous contrast. Multiplanar CT image reconstructions and MIPs were obtained to evaluate the vascular anatomy. RADIATION DOSE REDUCTION: This exam was performed according to the departmental dose-optimization program which includes automated exposure control, adjustment of the mA and/or kV according to patient size and/or use of iterative reconstruction technique. CONTRAST:  75mL OMNIPAQUE  IOHEXOL  350 MG/ML SOLN COMPARISON:  Chest [place]  dated 09/25/2024 and CT dated 07/27/2022. FINDINGS: Cardiovascular: There  is no cardiomegaly or pericardial effusion. There is 3 vessel coronary vascular calcification. Mild atherosclerotic calcification of the thoracic aorta. No aneurysmal dilatation. No pulmonary artery embolus identified. Mediastinum/Nodes: No hilar or mediastinal adenopathy. The esophagus is grossly unremarkable. No mediastinal fluid collection. Lungs/Pleura: No focal consolidation, pleural effusion, or pneumothorax. The central airways are patent. Upper Abdomen: No acute abnormality. Musculoskeletal: No acute osseous pathology. Chronic T8 compression fracture and vertebroplasty with extruded segment into the central canal. Review of the MIP images confirms the above findings. IMPRESSION: 1. No acute intrathoracic pathology. No CT evidence of pulmonary artery embolus. 2. Three vessel  coronary vascular calcification. 3.  Aortic Atherosclerosis (ICD10-I70.0). Electronically Signed   By: Vanetta Chou M.D.   On: 09/25/2024 13:25   DG Chest 2 View Result Date: 09/25/2024 CLINICAL DATA:  Left lateral chest/rib pain and cough which shortness-of-breath. EXAM: CHEST - 2 VIEW COMPARISON:  02/10/2020 FINDINGS: Lungs are adequately inflated without focal airspace consolidation or effusion. Cardiomediastinal silhouette is normal. Interval kyphoplasty of a midthoracic compression fracture. IMPRESSION: No acute cardiopulmonary disease. Electronically Signed   By: Toribio Agreste M.D.   On: 09/25/2024 11:01     Patient Profile   78 y.o. female with past medical history of hypertension, hyperlipidemia, elevated calcium  score and palpitations admitted with NSTEMI (elevated troponin and anterolateral/inferior T wave inversion).  CTA shows no pulmonary embolus and three-vessel coronary calcification.  Assessment & Plan  1 non-ST elevation myocardial infarction-patient is pain-free this morning.  Plan to continue aspirin  and heparin.  She is willing to try statin therapy at low-dose.  Add metoprolol 12.5 mg twice daily.  Schedule echocardiogram to assess LV function.  Proceed with cardiac catheterization today.  The risk and benefits including myocardial infarction, CVA and death discussed and she agrees to proceed.  2 hyperlipidemia-LDL today is 190.  She previously refused statin therapy.  She is willing to try low-dose and if she does not tolerate will refer for PCSK9 inhibitor.  Note she does have coronary calcification on her CT and on previous calcium  score.  3 hypertension-Will add low-dose metoprolol and transition to Toprol at discharge.  If LV function reduced will also add ARB or Entresto.  For questions or updates, please contact [facility] Please consult [email or web address] for contact info under       Signed, Redell Shallow, MD  09/26/2024, 7:36 AM

## 2024-09-26 NOTE — Progress Notes (Signed)
  Echocardiogram 2D Echocardiogram has been performed.  Shauntia Levengood 09/26/2024, 1:35 PM

## 2024-09-27 ENCOUNTER — Other Ambulatory Visit (HOSPITAL_COMMUNITY): Payer: Self-pay

## 2024-09-27 DIAGNOSIS — I5181 Takotsubo syndrome: Secondary | ICD-10-CM | POA: Insufficient documentation

## 2024-09-27 DIAGNOSIS — I214 Non-ST elevation (NSTEMI) myocardial infarction: Secondary | ICD-10-CM | POA: Diagnosis not present

## 2024-09-27 LAB — CBC
HCT: 44.8 % (ref 36.0–46.0)
Hemoglobin: 15 g/dL (ref 12.0–15.0)
MCH: 31.6 pg (ref 26.0–34.0)
MCHC: 33.5 g/dL (ref 30.0–36.0)
MCV: 94.3 fL (ref 80.0–100.0)
Platelets: 283 K/uL (ref 150–400)
RBC: 4.75 MIL/uL (ref 3.87–5.11)
RDW: 13.2 % (ref 11.5–15.5)
WBC: 9.1 K/uL (ref 4.0–10.5)
nRBC: 0.3 % — ABNORMAL HIGH (ref 0.0–0.2)

## 2024-09-27 LAB — BASIC METABOLIC PANEL WITH GFR
Anion gap: 12 (ref 5–15)
BUN: 16 mg/dL (ref 8–23)
CO2: 21 mmol/L — ABNORMAL LOW (ref 22–32)
Calcium: 9.5 mg/dL (ref 8.9–10.3)
Chloride: 103 mmol/L (ref 98–111)
Creatinine, Ser: 0.93 mg/dL (ref 0.44–1.00)
GFR, Estimated: >60 mL/min (ref >60)
Glucose, Bld: 81 mg/dL (ref 70–99)
Potassium: 4.1 mmol/L (ref 3.5–5.1)
Sodium: 136 mmol/L (ref 135–145)

## 2024-09-27 MED ORDER — ASPIRIN 81 MG PO TBEC
81.0000 mg | DELAYED_RELEASE_TABLET | Freq: Every day | ORAL | 0 refills | Status: AC
  Filled 2024-09-27: qty 90, 90d supply, fill #0

## 2024-09-27 MED ORDER — METOPROLOL SUCCINATE ER 25 MG PO TB24
25.0000 mg | ORAL_TABLET | Freq: Every day | ORAL | Status: DC
  Administered 2024-09-27: 25 mg via ORAL
  Filled 2024-09-27: qty 1

## 2024-09-27 MED ORDER — ROSUVASTATIN CALCIUM 10 MG PO TABS
10.0000 mg | ORAL_TABLET | Freq: Every day | ORAL | 0 refills | Status: DC
  Filled 2024-09-27: qty 90, 90d supply, fill #0

## 2024-09-27 MED ORDER — FUROSEMIDE 20 MG PO TABS
20.0000 mg | ORAL_TABLET | Freq: Once | ORAL | Status: AC
  Administered 2024-09-27: 20 mg via ORAL
  Filled 2024-09-27: qty 1

## 2024-09-27 MED ORDER — ASPIRIN 81 MG PO TBEC
81.0000 mg | DELAYED_RELEASE_TABLET | Freq: Every day | ORAL | Status: DC
  Administered 2024-09-27: 81 mg via ORAL
  Filled 2024-09-27: qty 1

## 2024-09-27 MED ORDER — METOPROLOL SUCCINATE ER 25 MG PO TB24
25.0000 mg | ORAL_TABLET | Freq: Every day | ORAL | 0 refills | Status: DC
  Filled 2024-09-27: qty 90, 90d supply, fill #0

## 2024-09-27 NOTE — Progress Notes (Addendum)
 Heart Failure Navigator Progress Note  Assessed for Heart & Vascular TOC clinic readiness.  Patient does not meet criteria due to has a scheduled 10/17/2024. No HF TOC. .  Navigator will sign off at this time.   Stephane Haddock, BSN, Scientist, Clinical (histocompatibility And Immunogenetics) Only

## 2024-09-27 NOTE — TOC CM/SW Note (Signed)
 Transition of Care [facility]) - Inpatient Brief Assessment   Patient Details  Name: Melanie Evans MRN: [id number] Date of Birth: 01-Jan-1946  Transition of Care [facility]) CM/SW Contact:    Sudie Erminio Deems, RN Phone Number: 09/27/2024, 11:07 AM   Clinical Narrative: Patient presented for chest pain- Nstemi. PTA patient was from home with spouse. Patient has insurance and PCP. No home needs identified at this time.    Transition of Care Asessment: Insurance and Status: Insurance coverage has been reviewed Patient has primary care physician: Yes Home environment has been reviewed: reviewed Prior level of function:: independent Prior/Current Home Services: No current home services Social Drivers of Health Review: SDOH reviewed no interventions necessary Readmission risk has been reviewed: Yes Transition of care needs: no transition of care needs at this time

## 2024-09-27 NOTE — Progress Notes (Signed)
 Rounding Note   Patient Name: Melanie Evans Date of Encounter: 09/27/2024  [facility] Cardiologist: Redell Shallow, MD   Subjective No dyspnea; mild chest soreness  Scheduled Meds:  clonazePAM   0.5 mg Oral QHS   enoxaparin  (LOVENOX ) injection  40 mg Subcutaneous Q24H   free water   250 mL Oral Once   metoprolol  tartrate  12.5 mg Oral BID   rosuvastatin   10 mg Oral Daily   sodium chloride  flush  3 mL Intravenous Once   sodium chloride  flush  3 mL Intravenous Q12H   Continuous Infusions:  sodium chloride      PRN Meds: sodium chloride , acetaminophen  **OR** acetaminophen , bisacodyl , fluticasone , Heparin  (Porcine) in NaCl, ipratropium-albuterol , nitroGLYCERIN , senna-docusate, sodium chloride  flush   Vital Signs  Vitals:   09/26/24 1849 09/26/24 2014 09/27/24 0123 09/27/24 0504  BP: (!) 141/83 120/78 (!) 93/53 128/67  Pulse: 74 83 62 67  Resp: 20 16 15 16   Temp: 98.6 F (37 C) 98.1 F (36.7 C) 98.4 F (36.9 C) 98.1 F (36.7 C)  TempSrc: Oral Oral Oral Oral  SpO2: 98% 96% 93% 97%  Weight:      Height:        Intake/Output Summary (Last 24 hours) at 09/27/2024 0728 Last data filed at 09/26/2024 1000 Gross per 24 hour  Intake 30 ml  Output --  Net 30 ml      09/25/2024    9:47 AM 08/22/2024   10:07 AM 06/17/2024    4:12 PM  Last 3 Weights  Weight (lbs) 148 lb 151 lb 145 lb  Weight (kg) 67.132 kg 68.493 kg 65.772 kg      Telemetry Sinus with rare PVC - Personally Reviewed   Physical Exam  GEN: No acute distress.   Neck: No JVD Cardiac: RRR Respiratory: Clear to auscultation bilaterally. GI: Soft, nontender, non-distended  MS: No edema; radial cath site with no hematoma Neuro:  Nonfocal  Psych: Normal affect   Labs High Sensitivity Troponin:   Recent Labs  Lab 09/25/24 2025 09/25/24 2315  TROPONINIHS 900* 853*     Chemistry Recent Labs  Lab 09/25/24 0956 09/26/24 0403 09/26/24 1757 09/26/24 1802 09/27/24 0356  NA 140  138 139  139 139 136  K 4.4 3.9 3.9  3.9 3.9 4.1  CL 103 102  --   --  103  CO2 27 23  --   --  21*  GLUCOSE 95 90  --   --  81  BUN 15 12  --   --  16  CREATININE 0.81 0.90  --   --  0.93  CALCIUM  10.1 9.0  --   --  9.5  MG  --  1.8  --   --   --   PROT 7.0  --   --   --   --   ALBUMIN 4.0  --   --   --   --   AST 36  --   --   --   --   ALT 25  --   --   --   --   ALKPHOS 95  --   --   --   --   BILITOT 1.0  --   --   --   --   GFRNONAA >60 >60  --   --  >60  ANIONGAP 10 13  --   --  12    Lipids  Recent Labs  Lab 09/26/24 0403  CHOL 275*  TRIG 45  HDL 76  LDLCALC 190*  CHOLHDL 3.6    Hematology Recent Labs  Lab 09/25/24 0951 09/26/24 0403 09/26/24 1757 09/26/24 1802 09/27/24 0356  WBC 7.6 6.7  --   --  9.1  RBC 4.04 4.27  --   --  4.75  HGB 12.8 13.5 13.6  13.3 13.3 15.0  HCT 38.1 40.0 40.0  39.0 39.0 44.8  MCV 94.3 93.7  --   --  94.3  MCH 31.7 31.6  --   --  31.6  MCHC 33.6 33.8  --   --  33.5  RDW 13.2 13.2  --   --  13.2  PLT 211 214  --   --  283    BNP Recent Labs  Lab 09/25/24 0951  PROBNP 5,730.0*      Radiology  ECHOCARDIOGRAM COMPLETE Result Date: 09/26/2024    ECHOCARDIOGRAM REPORT   Patient Name:   ANTOINETTE BORGWARDT Date of Exam: 09/26/2024 Medical Rec #:  [id number]           Height:       65.5 in Accession #:    [id number]          Weight:       148.0 lb Date of Birth:  09/05/1946           BSA:          1.750 m Patient Age:    78 years            BP:           130/73 mmHg Patient Gender: F                   HR:           73 bpm. Exam Location:  Inpatient Procedure: 2D Echo (Both Spectral and Color Flow Doppler were utilized during            procedure). Indications:    NSTEMI  History:        Patient has prior history of Echocardiogram examinations, most                 recent 08/04/2022. Signs/Symptoms:elevated troponin; Risk                 Factors:Hypertension and Dyslipidemia.  Sonographer:    Tinnie Barefoot RDCS Referring Phys:  [id number] Mycal Conde S Jacoria Keiffer IMPRESSIONS  1. Left ventricular ejection fraction, by estimation, is 35%. The left ventricle has moderately decreased function. The left ventricle demonstrates regional wall motion abnormalities (see scoring diagram/findings for description). Left ventricular diastolic parameters are indeterminate. Elevated left atrial pressure.  2. Right ventricular systolic function is normal. The right ventricular size is normal. There is mildly elevated pulmonary artery systolic pressure. The estimated right ventricular systolic pressure is 41.2 mmHg.  3. The mitral valve is degenerative. Trivial mitral valve regurgitation. No evidence of mitral stenosis. Moderate mitral annular calcification.  4. The aortic valve is tricuspid. Aortic valve regurgitation is not visualized. No aortic stenosis is present.  5. The inferior vena cava is normal in size with greater than 50% respiratory variability, suggesting right atrial pressure of 3 mmHg. Conclusion(s)/Recommendation(s): Regional wall motion abnormalities may represent stress cardiomyopathy in the absence of obstructive CAD. FINDINGS  Left Ventricle: Left ventricular ejection fraction, by estimation, is 35%. The left ventricle has moderately decreased function. The left ventricle demonstrates regional wall motion abnormalities. The left ventricular internal cavity size was normal in size. There is no  left ventricular hypertrophy. Left ventricular diastolic function could not be evaluated due to mitral annular calcification (moderate or greater). Left ventricular diastolic parameters are indeterminate. Elevated left atrial pressure.  LV Wall Scoring: The mid and distal anterior wall, mid and distal anterior septum, mid and distal inferior wall, and mid inferoseptal segment are hypokinetic. Right Ventricle: The right ventricular size is normal. No increase in right ventricular wall thickness. Right ventricular systolic function is normal. There is mildly  elevated pulmonary artery systolic pressure. The tricuspid regurgitant velocity is 3.09  m/s, and with an assumed right atrial pressure of 3 mmHg, the estimated right ventricular systolic pressure is 41.2 mmHg. Left Atrium: Left atrial size was normal in size. Right Atrium: Right atrial size was normal in size. Pericardium: There is no evidence of pericardial effusion. Mitral Valve: The mitral valve is degenerative in appearance. Moderate mitral annular calcification. Trivial mitral valve regurgitation. No evidence of mitral valve stenosis. Tricuspid Valve: The tricuspid valve is normal in structure. Tricuspid valve regurgitation is mild . No evidence of tricuspid stenosis. Aortic Valve: The aortic valve is tricuspid. Aortic valve regurgitation is not visualized. No aortic stenosis is present. Pulmonic Valve: The pulmonic valve was normal in structure. Pulmonic valve regurgitation is trivial. No evidence of pulmonic stenosis. Aorta: The aortic root is normal in size and structure. Venous: The inferior vena cava is normal in size with greater than 50% respiratory variability, suggesting right atrial pressure of 3 mmHg. IAS/Shunts: No atrial level shunt detected by color flow Doppler.  LEFT VENTRICLE PLAX 2D LVIDd:         4.30 cm     Diastology LVIDs:         2.60 cm     LV e' medial:    6.09 cm/s LV PW:         0.80 cm     LV E/e' medial:  14.8 LV IVS:        1.00 cm     LV e' lateral:   7.62 cm/s LVOT diam:     1.80 cm     LV E/e' lateral: 11.9 LV SV:         36 LV SV Index:   21 LVOT Area:     2.54 cm LV IVRT:       67 msec  LV Volumes (MOD) LV vol d, MOD A4C: 60.5 ml LV vol s, MOD A4C: 30.1 ml LV SV MOD A4C:     60.5 ml RIGHT VENTRICLE             IVC RV Basal diam:  2.60 cm     IVC diam: 1.30 cm RV S prime:     11.90 cm/s TAPSE (M-mode): 2.2 cm LEFT ATRIUM             Index        RIGHT ATRIUM           Index LA diam:        3.40 cm 1.94 cm/m   RA Area:     12.40 cm LA Vol (A2C):   33.8 ml 19.31 ml/m  RA  Volume:   30.60 ml  17.48 ml/m LA Vol (A4C):   43.8 ml 25.02 ml/m LA Biplane Vol: 39.0 ml 22.28 ml/m  AORTIC VALVE LVOT Vmax:   67.30 cm/s LVOT Vmean:  45.000 cm/s LVOT VTI:    0.142 m  AORTA Ao Root diam: 2.90 cm Ao Asc diam:  3.20 cm MITRAL VALVE  TRICUSPID VALVE MV Area (PHT): 3.99 cm    TR Peak grad:   38.2 mmHg MV Decel Time: 190 msec    TR Vmax:        309.00 cm/s MV E velocity: 90.40 cm/s MV A velocity: 78.00 cm/s  SHUNTS MV E/A ratio:  1.16        Systemic VTI:  0.14 m                            Systemic Diam: 1.80 cm Soyla Merck MD Electronically signed by Soyla Merck MD Signature Date/Time: 09/26/2024/9:34:10 PM    Final    CARDIAC CATHETERIZATION Result Date: 09/26/2024 Conclusions: Mild to moderate coronary artery disease, as detailed below.  Most severe lesion is a focal 70% stenosis in the distal LCx supplying a relatively small obtuse marginal branches.  This does not account for the wall motion abnormality observed on left ventriculogram. Moderately reduced left ventricular systolic function (35-45%) with mid and apical hypokinesis/akinesis and preserved function of the basal segments.  Findings are consistent with Takotsubo (stress-induced) cardiomyopathy. Mildly elevated left ventricular filling pressure (LVEDP/PCWP 20 mmHg). Mild pulmonary hypertension (PA 42/15, mean 24 mmHg). Normal right heart filling pressures (mean RA 4 mmHg, RVEDP 5 mmHg). Normal Fick cardiac output/index (CO 4.4 L/min, CI 2.5 L/min/m). Recommendations: Escalate goal-directed medical therapy for management of stress-induced cardiomyopathy.  Could consider gentle diuresis if blood pressure and renal function allow tomorrow. Favor medical therapy of coronary artery disease.  If the patient were to have refractory symptoms, functional study to assess hemodynamic significance of LCx disease should be considered. Aggressive secondary prevention of coronary artery disease. Lonni Hanson, MD [facility]  CT Angio Chest PE W and/or Wo Contrast Result Date: 09/25/2024 CLINICAL DATA:  Concern for embolism. EXAM: CT ANGIOGRAPHY CHEST WITH CONTRAST TECHNIQUE: Multidetector CT imaging of the chest was performed using the standard protocol during bolus administration of intravenous contrast. Multiplanar CT image reconstructions and MIPs were obtained to evaluate the vascular anatomy. RADIATION DOSE REDUCTION: This exam was performed according to the departmental dose-optimization program which includes automated exposure control, adjustment of the mA and/or kV according to patient size and/or use of iterative reconstruction technique. CONTRAST:  75mL OMNIPAQUE  IOHEXOL  350 MG/ML SOLN COMPARISON:  Chest [place]  dated 09/25/2024 and CT dated 07/27/2022. FINDINGS: Cardiovascular: There is no cardiomegaly or pericardial effusion. There is 3 vessel coronary vascular calcification. Mild atherosclerotic calcification of the thoracic aorta. No aneurysmal dilatation. No pulmonary artery embolus identified. Mediastinum/Nodes: No hilar or mediastinal adenopathy. The esophagus is grossly unremarkable. No mediastinal fluid collection. Lungs/Pleura: No focal consolidation, pleural effusion, or pneumothorax. The central airways are patent. Upper Abdomen: No acute abnormality. Musculoskeletal: No acute osseous pathology. Chronic T8 compression fracture and vertebroplasty with extruded segment into the central canal. Review of the MIP images confirms the above findings. IMPRESSION: 1. No acute intrathoracic pathology. No CT evidence of pulmonary artery embolus. 2. Three vessel coronary vascular calcification. 3.  Aortic Atherosclerosis (ICD10-I70.0). Electronically Signed   By: Vanetta Chou M.D.   On: 09/25/2024 13:25   DG Chest 2 View Result Date: 09/25/2024 CLINICAL DATA:  Left lateral chest/rib pain and cough which shortness-of-breath. EXAM: CHEST - 2 VIEW COMPARISON:  02/10/2020 FINDINGS: Lungs are adequately  inflated without focal airspace consolidation or effusion. Cardiomediastinal silhouette is normal. Interval kyphoplasty of a midthoracic compression fracture. IMPRESSION: No acute cardiopulmonary disease. Electronically Signed   By: Toribio Agreste M.D.   On:  09/25/2024 11:01    Patient Profile   78 y.o. female with past medical history of hypertension, hyperlipidemia, elevated calcium  score and palpitations admitted with NSTEMI (elevated troponin and anterolateral/inferior T wave inversion).  CTA shows no pulmonary embolus and three-vessel coronary calcification.  Echocardiogram shows ejection fraction 35% with hypokinesis of the distal anterior wall, septum, inferior wall.  Cardiac catheterization shows 70% stenosis in the distal circumflex, ejection fraction 35 to 45% with findings suggestive of stress-induced cardiomyopathy, LVEDP 20 mmHg.  Assessment & Plan   1 stress-induced cardiomyopathy-results of cardiac catheterization and echocardiogram noted.  Will change metoprolol  to Toprol .  Transient hypotension early a.m. with systolic in the 80s.  Will therefore hold on ARB or Entresto but can be considered as an outpatient if blood pressure stable.  Plan to repeat echocardiogram in approximately 8 weeks as I would expect LV function to normalize.  2 coronary artery disease-plan medical therapy.  Continue low-dose Crestor  and add aspirin  81 mg daily.  3 severe hyperlipidemia-continue low-dose Crestor .  If she does not tolerate will refer to lipid clinic for PCSK9 inhibitor.  4 acute systolic congestive heart failure-LVEDP elevated at time of catheterization.  She is not markedly volume overloaded on examination.  Will give Lasix  20 mg p.o. x 1.  5 anxiety-patient should follow-up with her primary care physician for further management.  Patient can be discharged from a cardiac standpoint if she is stable after walking this morning.  Will arrange follow-up with APP in 2 weeks and me in 3 months.   Please call with questions.  For questions or updates, please contact [facility] Please consult [email or web address] for contact info under   Signed, Redell Shallow, MD  09/27/2024, 7:28 AM

## 2024-09-27 NOTE — Progress Notes (Signed)
 CARDIAC REHAB PHASE I     Pt resting in bed, feeling well today. Pt has just returned from walk in hallway with husband. Reports tolerating well with no CP, SOB or dizziness. Post MI education including restrictions, risk factors, exercise guidelines, antiplatelet therapy importance, MI booklet, NTG use, heart healthy low sodium diet and CRP2 reviewed. All questions and concerns addressed. Will refer to [facility] for CRP2. Plan for discharge home later today.   [id number] Vaughn Asberry Hacking, RN BSN 09/27/2024 10:42 AM

## 2024-09-27 NOTE — Discharge Summary (Signed)
 Physician Discharge Summary  Melanie Evans DOB: [birth year] DOA: 09/25/2024  PCP: Okey Carlin Redbird, MD  Admit date: 09/25/2024 Discharge date: 09/27/2024 30 Day Unplanned Readmission Risk Score    Flowsheet Row ED to Hosp-Admission (Current) from 09/25/2024 in [facility]  30 Day Unplanned Readmission Risk Score (%) 10.03 Filed at 09/27/2024 0801    This score is the patient's risk of an unplanned readmission within 30 days of being discharged (0 -100%). The score is based on dignosis, age, lab data, medications, orders, and past utilization.   Low:  0-14.9   Medium: 15-21.9   High: 22-29.9   Extreme: 30 and above          Admitted From: Home Disposition: Home  Recommendations for Outpatient Follow-up:  Follow up with PCP in 1-2 weeks Please obtain BMP/CBC in one week Follow-up with cardiology per their recommended time and date Please follow up with your PCP on the following pending results: Unresulted Labs (From admission, onward)     Start     Ordered   10/03/24 0500  Creatinine, serum  (enoxaparin  (LOVENOX )    CrCl >/= 30 ml/min)  Weekly,   R     Comments: while on enoxaparin  therapy    09/26/24 1902   09/27/24 0500  Lipoprotein A (LPA)  Tomorrow morning,   R        09/26/24 1902              Home Health: None Equipment/Devices: None  Discharge Condition: Stable CODE STATUS: Full code Diet recommendation:  Diet Order             Diet Heart Room service appropriate? Yes; Fluid consistency: Thin  Diet effective now                   Subjective: Seen and examined, husband at bedside.  Patient has no complaints, no chest pain or shortness of breath or palpitation.  Plan of discharge has been discussed with them by cardiology and they are in agreement.  Brief/Interim Summary: HPI: Melanie Evans is a 78 y.o. female with medical history significant for allergies, asthma, HTN, HLD, palpitations and anxiety who  [facility]ed [facility] for evaluation of shortness of breath and chest pain.  She used her home inhaler without any relief so she presented to the ED for further evaluation. She endorsed intermittent palpitations but denied any orthopnea, PND, leg swelling, abdominal pain, nausea, vomiting or diaphoresis.  She has been under slight stress since moving to a small apartment with her husband however no other significant stressors.   Upon arrival to ED, she was hemodynamically stable, initial labs significant for proBNP 5730, troponin 230->198->223. EKG with sinus rhythm with APCs, LAFB, prolonged QTc of 499 and T wave inversion in the inferior lateral leads. CXR shows no active disease. CTA chest PE study negative for PE but shows three-vessel coronary vascular calcification. Pt received aspirin  324 mg x 1 and started on IV heparin . Cardiology was consulted for evaluation. Patient was admitted to [facility] service.  Details below.   NSTEMI / Elevated troponin/stress-induced cardiomyopathy Presented with typical chest pain, troponin 853> 900.  Seen by cardiology, started on heparin  drip.  Echo showed ejection fraction of 35% with left ventricular regional wall motion abnormalities.  Cardiac catheterization shows 70% stenosis in the distal circumflex, ejection fraction 35 to 45% with findings suggestive of stress-induced cardiomyopathy, LVEDP 20 mmHg.  Due to low blood pressure, cardiology wants  to hold ARB or Entresto but will consider to start as outpatient.  They have cleared her for discharge with the plan to repeat echo in 8 weeks with the expectation that LV function may normalize.   # HTN Does not appear to be on any antihypertensives PTA.  Blood pressure on the low side.  She is asymptomatic.   # HLD: Cholesterol 275, LDL 190.  Has been started on Crestor .   # Prolonged QTc - Initial EKG showed prolonged QTc of 499   # Anxiety - Continue Klonopin  at bedtime   # Asthma - Chronic and stable, no  signs of acute exacerbation - As needed DuoNeb   # Allergies - Continue as needed Flonase     Discharge plan was discussed with patient and/or family member/husband and they verbalized understanding and agreed with it.  Discharge Diagnoses:  Principal Problem:   NSTEMI (non-ST elevated myocardial infarction) (HCC) Active Problems:   Essential hypertension   Hyperlipidemia   Chronic asthma without complication   Anxiety   Elevated troponin   Takotsubo cardiomyopathy    Discharge Instructions  Discharge Instructions     AMB referral to Phase II Cardiac Rehabilitation   Complete by: As directed    Stress-induced cardiomyopathy   Diagnosis: Type II MI   After initial evaluation and assessments completed: Virtual Based Care may be provided alone or in conjunction with Phase 2 Cardiac Rehab based on patient barriers.: Yes   Intensive Cardiac Rehabilitation (ICR) [facility] location only OR Traditional Cardiac Rehabilitation (TCR) *If criteria for ICR are not met will enroll in TCR ([facility] only): Yes      Allergies as of 09/27/2024       Reactions   Cefzil [cefprozil] Other (See Comments)   Unknown reaction   Ery-tab [erythromycin] Other (See Comments)   Unknown reaction   Sulfa Antibiotics Other (See Comments)   Unknown reaction        Medication List     TAKE these medications    albuterol  108 (90 Base) MCG/ACT inhaler Commonly known as: VENTOLIN  HFA Inhale 1-2 puffs into the lungs every 6 (six) hours as needed for wheezing or shortness of breath.   aspirin  EC 81 MG tablet Take 1 tablet (81 mg total) by mouth daily. Swallow whole. Start taking on: September 28, 2024   azelastine 0.05 % ophthalmic solution Commonly known as: OPTIVAR Place 1 drop into both eyes 2 (two) times daily as needed (eye irritation).   azelastine 0.1 % nasal spray Commonly known as: ASTELIN Place 1-2 sprays into both nostrils 2 (two) times daily as needed for allergies or rhinitis.   b complex  vitamins capsule Take 1 capsule by mouth daily.   CITRUS BERGAMOT PO Take 1 tablet by mouth daily.   clonazePAM  0.5 MG tablet Commonly known as: KLONOPIN  TAKE 1-3 TABLETS BY MOUTH DAILY FOR SLEEP IF NEEDED What changed:  how much to take how to take this when to take this additional instructions   COQ10 PO Take 1 capsule by mouth daily with supper.   ELDERBERRY PO Take 5 mLs by mouth daily as needed (immune support).   MAGNESIUM GLYCINATE PO Take 1 tablet by mouth 3 (three) times daily.   metoprolol  succinate 25 MG 24 hr tablet Commonly known as: TOPROL -XL Take 1 tablet (25 mg total) by mouth daily. Start taking on: September 28, 2024   OMEGA 3 PO Take 1 capsule by mouth daily.   PROBIOTIC DAILY PO Take 1 capsule by mouth daily.  rosuvastatin  10 MG tablet Commonly known as: CRESTOR  Take 1 tablet (10 mg total) by mouth daily. Start taking on: September 28, 2024   VITAMIN B-12 PO Take 1 tablet by mouth daily with supper.   VITAMIN C PO Take 1 tablet by mouth daily.   VITAMIN D-3 PO Take 1 capsule by mouth daily.   VITAMIN K PO Take 1 tablet by mouth daily. Vitamin K2-7   zaleplon  5 MG capsule Commonly known as: SONATA  1 at night if needed for sleep        Follow-up Information     Okey Carlin Redbird, MD Follow up in 1 week(s).   Specialty: Family Medicine Contact information: [address] [place] [address] [phone]                Allergies  Allergen Reactions   Cefzil [Cefprozil] Other (See Comments)    Unknown reaction   Ery-Tab [Erythromycin] Other (See Comments)    Unknown reaction   Sulfa Antibiotics Other (See Comments)    Unknown reaction    Consultations: Cardiology   Procedures/Studies: ECHOCARDIOGRAM COMPLETE Result Date: 09/26/2024    ECHOCARDIOGRAM REPORT   Patient Name:   Melanie Evans Date of Exam: 09/26/2024 Medical Rec #:  [id number]           Height:       65.5 in Accession #:    [id number]           Weight:       148.0 lb Date of Birth:  [birth year]           BSA:          1.750 m Patient Age:    78 years            BP:           130/73 mmHg Patient Gender: F                   HR:           73 bpm. Exam Location:  Inpatient Procedure: 2D Echo (Both Spectral and Color Flow Doppler were utilized during            procedure). Indications:    NSTEMI  History:        Patient has prior history of Echocardiogram examinations, most                 recent 08/04/2022. Signs/Symptoms:elevated troponin; Risk                 Factors:Hypertension and Dyslipidemia.  Sonographer:    Tinnie Barefoot RDCS Referring Phys: [id number] BRIAN S CRENSHAW IMPRESSIONS  1. Left ventricular ejection fraction, by estimation, is 35%. The left ventricle has moderately decreased function. The left ventricle demonstrates regional wall motion abnormalities (see scoring diagram/findings for description). Left ventricular diastolic parameters are indeterminate. Elevated left atrial pressure.  2. Right ventricular systolic function is normal. The right ventricular size is normal. There is mildly elevated pulmonary artery systolic pressure. The estimated right ventricular systolic pressure is 41.2 mmHg.  3. The mitral valve is degenerative. Trivial mitral valve regurgitation. No evidence of mitral stenosis. Moderate mitral annular calcification.  4. The aortic valve is tricuspid. Aortic valve regurgitation is not visualized. No aortic stenosis is present.  5. The inferior vena cava is normal in size with greater than 50% respiratory variability, suggesting right atrial pressure of 3 mmHg. Conclusion(s)/Recommendation(s): Regional wall motion abnormalities may represent stress cardiomyopathy in the  absence of obstructive CAD. FINDINGS  Left Ventricle: Left ventricular ejection fraction, by estimation, is 35%. The left ventricle has moderately decreased function. The left ventricle demonstrates regional wall motion abnormalities. The left ventricular  internal cavity size was normal in size. There is no left ventricular hypertrophy. Left ventricular diastolic function could not be evaluated due to mitral annular calcification (moderate or greater). Left ventricular diastolic parameters are indeterminate. Elevated left atrial pressure.  LV Wall Scoring: The mid and distal anterior wall, mid and distal anterior septum, mid and distal inferior wall, and mid inferoseptal segment are hypokinetic. Right Ventricle: The right ventricular size is normal. No increase in right ventricular wall thickness. Right ventricular systolic function is normal. There is mildly elevated pulmonary artery systolic pressure. The tricuspid regurgitant velocity is 3.09  m/s, and with an assumed right atrial pressure of 3 mmHg, the estimated right ventricular systolic pressure is 41.2 mmHg. Left Atrium: Left atrial size was normal in size. Right Atrium: Right atrial size was normal in size. Pericardium: There is no evidence of pericardial effusion. Mitral Valve: The mitral valve is degenerative in appearance. Moderate mitral annular calcification. Trivial mitral valve regurgitation. No evidence of mitral valve stenosis. Tricuspid Valve: The tricuspid valve is normal in structure. Tricuspid valve regurgitation is mild . No evidence of tricuspid stenosis. Aortic Valve: The aortic valve is tricuspid. Aortic valve regurgitation is not visualized. No aortic stenosis is present. Pulmonic Valve: The pulmonic valve was normal in structure. Pulmonic valve regurgitation is trivial. No evidence of pulmonic stenosis. Aorta: The aortic root is normal in size and structure. Venous: The inferior vena cava is normal in size with greater than 50% respiratory variability, suggesting right atrial pressure of 3 mmHg. IAS/Shunts: No atrial level shunt detected by color flow Doppler.  LEFT VENTRICLE PLAX 2D LVIDd:         4.30 cm     Diastology LVIDs:         2.60 cm     LV e' medial:    6.09 cm/s LV PW:          0.80 cm     LV E/e' medial:  14.8 LV IVS:        1.00 cm     LV e' lateral:   7.62 cm/s LVOT diam:     1.80 cm     LV E/e' lateral: 11.9 LV SV:         36 LV SV Index:   21 LVOT Area:     2.54 cm LV IVRT:       67 msec  LV Volumes (MOD) LV vol d, MOD A4C: 60.5 ml LV vol s, MOD A4C: 30.1 ml LV SV MOD A4C:     60.5 ml RIGHT VENTRICLE             IVC RV Basal diam:  2.60 cm     IVC diam: 1.30 cm RV S prime:     11.90 cm/s TAPSE (M-mode): 2.2 cm LEFT ATRIUM             Index        RIGHT ATRIUM           Index LA diam:        3.40 cm 1.94 cm/m   RA Area:     12.40 cm LA Vol (A2C):   33.8 ml 19.31 ml/m  RA Volume:   30.60 ml  17.48 ml/m LA Vol (A4C):   43.8 ml 25.02 ml/m LA  Biplane Vol: 39.0 ml 22.28 ml/m  AORTIC VALVE LVOT Vmax:   67.30 cm/s LVOT Vmean:  45.000 cm/s LVOT VTI:    0.142 m  AORTA Ao Root diam: 2.90 cm Ao Asc diam:  3.20 cm MITRAL VALVE               TRICUSPID VALVE MV Area (PHT): 3.99 cm    TR Peak grad:   38.2 mmHg MV Decel Time: 190 msec    TR Vmax:        309.00 cm/s MV E velocity: 90.40 cm/s MV A velocity: 78.00 cm/s  SHUNTS MV E/A ratio:  1.16        Systemic VTI:  0.14 m                            Systemic Diam: 1.80 cm Soyla Merck MD Electronically signed by Soyla Merck MD Signature Date/Time: 09/26/2024/9:34:10 PM    Final    CARDIAC CATHETERIZATION Result Date: 09/26/2024 Conclusions: Mild to moderate coronary artery disease, as detailed below.  Most severe lesion is a focal 70% stenosis in the distal LCx supplying a relatively small obtuse marginal branches.  This does not account for the wall motion abnormality observed on left ventriculogram. Moderately reduced left ventricular systolic function (35-45%) with mid and apical hypokinesis/akinesis and preserved function of the basal segments.  Findings are consistent with Takotsubo (stress-induced) cardiomyopathy. Mildly elevated left ventricular filling pressure (LVEDP/PCWP 20 mmHg). Mild pulmonary hypertension (PA 42/15, mean  24 mmHg). Normal right heart filling pressures (mean RA 4 mmHg, RVEDP 5 mmHg). Normal Fick cardiac output/index (CO 4.4 L/min, CI 2.5 L/min/m). Recommendations: Escalate goal-directed medical therapy for management of stress-induced cardiomyopathy.  Could consider gentle diuresis if blood pressure and renal function allow tomorrow. Favor medical therapy of coronary artery disease.  If the patient were to have refractory symptoms, functional study to assess hemodynamic significance of LCx disease should be considered. Aggressive secondary prevention of coronary artery disease. Lonni Hanson, MD [facility]  CT Angio Chest PE W and/or Wo Contrast Result Date: 09/25/2024 CLINICAL DATA:  Concern for embolism. EXAM: CT ANGIOGRAPHY CHEST WITH CONTRAST TECHNIQUE: Multidetector CT imaging of the chest was performed using the standard protocol during bolus administration of intravenous contrast. Multiplanar CT image reconstructions and MIPs were obtained to evaluate the vascular anatomy. RADIATION DOSE REDUCTION: This exam was performed according to the departmental dose-optimization program which includes automated exposure control, adjustment of the mA and/or kV according to patient size and/or use of iterative reconstruction technique. CONTRAST:  75mL OMNIPAQUE  IOHEXOL  350 MG/ML SOLN COMPARISON:  Chest [place]  dated 09/25/2024 and CT dated 07/27/2022. FINDINGS: Cardiovascular: There is no cardiomegaly or pericardial effusion. There is 3 vessel coronary vascular calcification. Mild atherosclerotic calcification of the thoracic aorta. No aneurysmal dilatation. No pulmonary artery embolus identified. Mediastinum/Nodes: No hilar or mediastinal adenopathy. The esophagus is grossly unremarkable. No mediastinal fluid collection. Lungs/Pleura: No focal consolidation, pleural effusion, or pneumothorax. The central airways are patent. Upper Abdomen: No acute abnormality. Musculoskeletal: No acute osseous pathology.  Chronic T8 compression fracture and vertebroplasty with extruded segment into the central canal. Review of the MIP images confirms the above findings. IMPRESSION: 1. No acute intrathoracic pathology. No CT evidence of pulmonary artery embolus. 2. Three vessel coronary vascular calcification. 3.  Aortic Atherosclerosis (ICD10-I70.0). Electronically Signed   By: Vanetta Chou M.D.   On: 09/25/2024 13:25   DG Chest 2 View Result Date: 09/25/2024 CLINICAL DATA:  Left lateral chest/rib pain and cough which shortness-of-breath. EXAM: CHEST - 2 VIEW COMPARISON:  02/10/2020 FINDINGS: Lungs are adequately inflated without focal airspace consolidation or effusion. Cardiomediastinal silhouette is normal. Interval kyphoplasty of a midthoracic compression fracture. IMPRESSION: No acute cardiopulmonary disease. Electronically Signed   By: Toribio Agreste M.D.   On: 09/25/2024 11:01     Discharge Exam: Vitals:   09/27/24 0504 09/27/24 0853  BP: 128/67 119/63  Pulse: 67 80  Resp: 16 16  Temp: 98.1 F (36.7 C) 98.3 F (36.8 C)  SpO2: 97% 98%   Vitals:   09/26/24 2014 09/27/24 0123 09/27/24 0504 09/27/24 0853  BP: 120/78 (!) 93/53 128/67 119/63  Pulse: 83 62 67 80  Resp: 16 15 16 16   Temp: 98.1 F (36.7 C) 98.4 F (36.9 C) 98.1 F (36.7 C) 98.3 F (36.8 C)  TempSrc: Oral Oral Oral Oral  SpO2: 96% 93% 97% 98%  Weight:      Height:        General: Pt is alert, awake, not in acute distress Cardiovascular: RRR, S1/S2 +, no rubs, no gallops Respiratory: CTA bilaterally, no wheezing, no rhonchi Abdominal: Soft, NT, ND, bowel sounds + Extremities: no edema, no cyanosis    The results of significant diagnostics from this hospitalization (including imaging, microbiology, ancillary and laboratory) are listed below for reference.     Microbiology: Recent Results (from the past 240 hours)  Resp panel by RT-PCR (RSV, Flu A&B, Covid) Anterior Nasal Swab     Status: None   Collection Time: 09/25/24  10:47 AM   Specimen: Anterior Nasal Swab  Result Value Ref Range Status   SARS Coronavirus 2 by RT PCR NEGATIVE NEGATIVE Final    Comment: (NOTE) SARS-CoV-2 target nucleic acids are NOT DETECTED.  The SARS-CoV-2 RNA is generally detectable in upper respiratory specimens during the acute phase of infection. The lowest concentration of SARS-CoV-2 viral copies this assay can detect is 138 copies/mL. A negative result does not preclude SARS-Cov-2 infection and should not be used as the sole basis for treatment or other patient management decisions. A negative result may occur with  improper specimen collection/handling, submission of specimen other than nasopharyngeal swab, presence of viral mutation(s) within the areas targeted by this assay, and inadequate number of viral copies(<138 copies/mL). A negative result must be combined with clinical observations, patient history, and epidemiological information. The expected result is Negative.  Fact Sheet for Patients:  bloggercourse.com  Fact Sheet for Healthcare Providers:  seriousbroker.it  This test is no t yet approved or cleared by the [place]  FDA and  has been authorized for detection and/or diagnosis of SARS-CoV-2 by FDA under an Emergency Use Authorization (EUA). This EUA will remain  in effect (meaning this test can be used) for the duration of the COVID-19 declaration under Section 564(b)(1) of the Act, 21 U.S.C.section 360bbb-3(b)(1), unless the authorization is terminated  or revoked sooner.       Influenza A by PCR NEGATIVE NEGATIVE Final   Influenza B by PCR NEGATIVE NEGATIVE Final    Comment: (NOTE) The Xpert Xpress SARS-CoV-2/FLU/RSV plus assay is intended as an aid in the diagnosis of influenza from Nasopharyngeal swab specimens and should not be used as a sole basis for treatment. Nasal washings and aspirates are unacceptable for Xpert Xpress  SARS-CoV-2/FLU/RSV testing.  Fact Sheet for Patients: bloggercourse.com  Fact Sheet for Healthcare Providers: seriousbroker.it  This test is not yet approved or cleared by the [place]  FDA and has been  authorized for detection and/or diagnosis of SARS-CoV-2 by FDA under an Emergency Use Authorization (EUA). This EUA will remain in effect (meaning this test can be used) for the duration of the COVID-19 declaration under Section 564(b)(1) of the Act, 21 U.S.C. section 360bbb-3(b)(1), unless the authorization is terminated or revoked.     Resp Syncytial Virus by PCR NEGATIVE NEGATIVE Final    Comment: (NOTE) Fact Sheet for Patients: bloggercourse.com  Fact Sheet for Healthcare Providers: seriousbroker.it  This test is not yet approved or cleared by the [place]  FDA and has been authorized for detection and/or diagnosis of SARS-CoV-2 by FDA under an Emergency Use Authorization (EUA). This EUA will remain in effect (meaning this test can be used) for the duration of the COVID-19 declaration under Section 564(b)(1) of the Act, 21 U.S.C. section 360bbb-3(b)(1), unless the authorization is terminated or revoked.  Performed at [facility], [address] [place], [place] [address]      Labs: BNP (last 3 results) No results for input(s): BNP in the last 8760 hours. Basic Metabolic Panel: Recent Labs  Lab 09/25/24 0956 09/26/24 0403 09/26/24 1757 09/26/24 1802 09/27/24 0356  NA 140 138 139  139 139 136  K 4.4 3.9 3.9  3.9 3.9 4.1  CL 103 102  --   --  103  CO2 27 23  --   --  21*  GLUCOSE 95 90  --   --  81  BUN 15 12  --   --  16  CREATININE 0.81 0.90  --   --  0.93  CALCIUM  10.1 9.0  --   --  9.5  MG  --  1.8  --   --   --    Liver Function Tests: Recent Labs  Lab 09/25/24 0956  AST 36  ALT 25  ALKPHOS 95  BILITOT 1.0  PROT  7.0  ALBUMIN 4.0   No results for input(s): LIPASE, AMYLASE in the last 168 hours. No results for input(s): AMMONIA in the last 168 hours. CBC: Recent Labs  Lab 09/25/24 0951 09/26/24 0403 09/26/24 1757 09/26/24 1802 09/27/24 0356  WBC 7.6 6.7  --   --  9.1  NEUTROABS 5.3  --   --   --   --   HGB 12.8 13.5 13.6  13.3 13.3 15.0  HCT 38.1 40.0 40.0  39.0 39.0 44.8  MCV 94.3 93.7  --   --  94.3  PLT 211 214  --   --  283   Cardiac Enzymes: No results for input(s): CKTOTAL, CKMB, CKMBINDEX, TROPONINI in the last 168 hours. BNP: Invalid input(s): POCBNP CBG: Recent Labs  Lab 09/25/24 1827 09/26/24 0809  GLUCAP 114* 96   D-Dimer No results for input(s): DDIMER in the last 72 hours. Hgb A1c No results for input(s): HGBA1C in the last 72 hours. Lipid Profile Recent Labs    09/26/24 0403  CHOL 275*  HDL 76  LDLCALC 190*  TRIG 45  CHOLHDL 3.6   Thyroid  function studies No results for input(s): TSH, T4TOTAL, T3FREE, THYROIDAB in the last 72 hours.  Invalid input(s): FREET3 Anemia work up No results for input(s): VITAMINB12, FOLATE, FERRITIN, TIBC, IRON, RETICCTPCT in the last 72 hours. Urinalysis No results found for: COLORURINE, APPEARANCEUR, LABSPEC, PHURINE, GLUCOSEU, HGBUR, BILIRUBINUR, KETONESUR, PROTEINUR, UROBILINOGEN, NITRITE, LEUKOCYTESUR Sepsis Labs Recent Labs  Lab 09/25/24 0951 09/26/24 0403 09/27/24 0356  WBC 7.6 6.7 9.1   Microbiology Recent Results (from the past 240 hours)  Resp  panel by RT-PCR (RSV, Flu A&B, Covid) Anterior Nasal Swab     Status: None   Collection Time: 09/25/24 10:47 AM   Specimen: Anterior Nasal Swab  Result Value Ref Range Status   SARS Coronavirus 2 by RT PCR NEGATIVE NEGATIVE Final    Comment: (NOTE) SARS-CoV-2 target nucleic acids are NOT DETECTED.  The SARS-CoV-2 RNA is generally detectable in upper respiratory specimens during the acute phase of  infection. The lowest concentration of SARS-CoV-2 viral copies this assay can detect is 138 copies/mL. A negative result does not preclude SARS-Cov-2 infection and should not be used as the sole basis for treatment or other patient management decisions. A negative result may occur with  improper specimen collection/handling, submission of specimen other than nasopharyngeal swab, presence of viral mutation(s) within the areas targeted by this assay, and inadequate number of viral copies(<138 copies/mL). A negative result must be combined with clinical observations, patient history, and epidemiological information. The expected result is Negative.  Fact Sheet for Patients:  bloggercourse.com  Fact Sheet for Healthcare Providers:  seriousbroker.it  This test is no t yet approved or cleared by the [place]  FDA and  has been authorized for detection and/or diagnosis of SARS-CoV-2 by FDA under an Emergency Use Authorization (EUA). This EUA will remain  in effect (meaning this test can be used) for the duration of the COVID-19 declaration under Section 564(b)(1) of the Act, 21 U.S.C.section 360bbb-3(b)(1), unless the authorization is terminated  or revoked sooner.       Influenza A by PCR NEGATIVE NEGATIVE Final   Influenza B by PCR NEGATIVE NEGATIVE Final    Comment: (NOTE) The Xpert Xpress SARS-CoV-2/FLU/RSV plus assay is intended as an aid in the diagnosis of influenza from Nasopharyngeal swab specimens and should not be used as a sole basis for treatment. Nasal washings and aspirates are unacceptable for Xpert Xpress SARS-CoV-2/FLU/RSV testing.  Fact Sheet for Patients: bloggercourse.com  Fact Sheet for Healthcare Providers: seriousbroker.it  This test is not yet approved or cleared by the [place]  FDA and has been authorized for detection and/or diagnosis of SARS-CoV-2  by FDA under an Emergency Use Authorization (EUA). This EUA will remain in effect (meaning this test can be used) for the duration of the COVID-19 declaration under Section 564(b)(1) of the Act, 21 U.S.C. section 360bbb-3(b)(1), unless the authorization is terminated or revoked.     Resp Syncytial Virus by PCR NEGATIVE NEGATIVE Final    Comment: (NOTE) Fact Sheet for Patients: bloggercourse.com  Fact Sheet for Healthcare Providers: seriousbroker.it  This test is not yet approved or cleared by the [place]  FDA and has been authorized for detection and/or diagnosis of SARS-CoV-2 by FDA under an Emergency Use Authorization (EUA). This EUA will remain in effect (meaning this test can be used) for the duration of the COVID-19 declaration under Section 564(b)(1) of the Act, 21 U.S.C. section 360bbb-3(b)(1), unless the authorization is terminated or revoked.  Performed at [facility], [address], [place], [place] [address]     FURTHER DISCHARGE INSTRUCTIONS:   Get Medicines reviewed and adjusted: Please take all your medications with you for your next visit with your Primary MD   Laboratory/radiological data: Please request your Primary MD to go over all hospital tests and procedure/radiological results at the follow up, please ask your Primary MD to get all Hospital records sent to his/her office.   In some cases, they will be blood work, cultures and biopsy results pending at the time of  your discharge. Please request that your primary care M.D. goes through all the records of your hospital data and follows up on these results.   Also Note the following: If you experience worsening of your admission symptoms, develop shortness of breath, life threatening emergency, suicidal or homicidal thoughts you must seek medical attention immediately by calling 911 or calling your MD immediately  if symptoms less  severe.   You must read complete instructions/literature along with all the possible adverse reactions/side effects for all the Medicines you take and that have been prescribed to you. Take any new Medicines after you have completely understood and accpet all the possible adverse reactions/side effects.    patient was instructed, not to drive, operate heavy machinery, perform activities at heights, swimming or participation in water  activities or provide baby-sitting services while on Pain, Sleep and Anxiety Medications; until their outpatient Physician has advised to do so again. Also recommended to not to take more than prescribed Pain, Sleep and Anxiety Medications.  It is not advisable to combine anxiety, sleep and pain medications without talking with your primary care provider.     Wear Seat belts while driving.   Please note: You were cared for by a hospitalist during your hospital stay. Once you are discharged, your primary care physician will handle any further medical issues. Please note that NO REFILLS for any discharge medications will be authorized once you are discharged, as it is imperative that you return to your primary care physician (or establish a relationship with a primary care physician if you do not have one) for your post hospital discharge needs so that they can reassess your need for medications and monitor your lab values  Time coordinating discharge: Over 30 minutes  SIGNED:   Fredia Skeeter, MD  [facility] 09/27/2024, 10:24 AM *Please note that this is a verbal dictation therefore any spelling or grammatical errors are due to the Dragon Medical One system interpretation. If 7PM-7AM, please contact night-coverage [email or web address]

## 2024-09-27 NOTE — Plan of Care (Signed)

## 2024-09-28 LAB — LIPOPROTEIN A (LPA): Lipoprotein (a): <8.4 nmol/L (ref <75.0)

## 2024-09-30 ENCOUNTER — Telehealth (HOSPITAL_COMMUNITY): Payer: Self-pay

## 2024-09-30 NOTE — Telephone Encounter (Signed)
Called patient to see if she is interested in the Cardiac Rehab Program. Patient expressed interest. Explained scheduling process and went over insurance process, patient verbalized understanding. Will contact patient for scheduling once f/u has been completed. °

## 2024-10-02 DIAGNOSIS — I700 Atherosclerosis of aorta: Secondary | ICD-10-CM | POA: Diagnosis not present

## 2024-10-02 DIAGNOSIS — M810 Age-related osteoporosis without current pathological fracture: Secondary | ICD-10-CM | POA: Diagnosis not present

## 2024-10-02 DIAGNOSIS — I5181 Takotsubo syndrome: Secondary | ICD-10-CM | POA: Diagnosis not present

## 2024-10-02 DIAGNOSIS — I214 Non-ST elevation (NSTEMI) myocardial infarction: Secondary | ICD-10-CM | POA: Diagnosis not present

## 2024-10-02 DIAGNOSIS — Z5189 Encounter for other specified aftercare: Secondary | ICD-10-CM | POA: Diagnosis not present

## 2024-10-02 DIAGNOSIS — J4520 Mild intermittent asthma, uncomplicated: Secondary | ICD-10-CM | POA: Diagnosis not present

## 2024-10-07 DIAGNOSIS — M5031 Other cervical disc degeneration,  high cervical region: Secondary | ICD-10-CM | POA: Diagnosis not present

## 2024-10-07 DIAGNOSIS — M9901 Segmental and somatic dysfunction of cervical region: Secondary | ICD-10-CM | POA: Diagnosis not present

## 2024-10-08 ENCOUNTER — Telehealth: Payer: Self-pay | Admitting: Cardiology

## 2024-10-08 NOTE — Telephone Encounter (Signed)
 Pt called in asking if she needs labs done again prior to her appt or if she should come to her appt fasting. She scheduled for  12/11

## 2024-10-09 NOTE — Telephone Encounter (Signed)
 Spoke with pt, Aware of dr vertie recommendations.

## 2024-10-11 NOTE — Progress Notes (Unsigned)
 HPI: Follow-up CAD, hypertension and hyperlipidemia.  Monitor September 2023 showed sinus rhythm with occasional PACs, brief runs of PAT, occasional PVC and rare couplet.  Calcium  score September 2023 650 which was 86 percentile.  Patient admitted November 2025 with non-ST elevation myocardial infarction.  Cardiac catheterization revealed myocardial bridge in the distal LAD, 70% distal circumflex and no other disease noted; ejection fraction 35 to 45% with mid and apical hypokinesis/akinesis suggestive of Takotsubo cardiomyopathy.  Echocardiogram showed ejection fraction 35% and wall motion abnormalities suggestive of Takotsubo cardiomyopathy.  Since last seen,   Current Outpatient Medications  Medication Sig Dispense Refill   albuterol  (PROVENTIL  HFA;VENTOLIN  HFA) 108 (90 Base) MCG/ACT inhaler Inhale 1-2 puffs into the lungs every 6 (six) hours as needed for wheezing or shortness of breath.     Ascorbic Acid (VITAMIN C PO) Take 1 tablet by mouth daily.     aspirin  EC 81 MG tablet Take 1 tablet (81 mg total) by mouth daily. Swallow whole. 90 tablet 0   azelastine (ASTELIN) 0.1 % nasal spray Place 1-2 sprays into both nostrils 2 (two) times daily as needed for allergies or rhinitis.     azelastine (OPTIVAR) 0.05 % ophthalmic solution Place 1 drop into both eyes 2 (two) times daily as needed (eye irritation).  2   b complex vitamins capsule Take 1 capsule by mouth daily.     Cholecalciferol (VITAMIN D-3 PO) Take 1 capsule by mouth daily.     CITRUS BERGAMOT PO Take 1 tablet by mouth daily.     clonazePAM  (KLONOPIN ) 0.5 MG tablet TAKE 1-3 TABLETS BY MOUTH DAILY FOR SLEEP IF NEEDED (Patient taking differently: Take 0.5 mg by mouth at bedtime.) 90 tablet 5   Coenzyme Q10 (COQ10 PO) Take 1 capsule by mouth daily with supper.     Cyanocobalamin (VITAMIN B-12 PO) Take 1 tablet by mouth daily with supper.     ELDERBERRY PO Take 5 mLs by mouth daily as needed (immune support).     MAGNESIUM GLYCINATE  PO Take 1 tablet by mouth 3 (three) times daily.     metoprolol  succinate (TOPROL -XL) 25 MG 24 hr tablet Take 1 tablet (25 mg total) by mouth daily. 90 tablet 0   Omega-3 Fatty Acids (OMEGA 3 PO) Take 1 capsule by mouth daily.     Probiotic Product (PROBIOTIC DAILY PO) Take 1 capsule by mouth daily.     rosuvastatin  (CRESTOR ) 10 MG tablet Take 1 tablet (10 mg total) by mouth daily. 90 tablet 0   VITAMIN K PO Take 1 tablet by mouth daily. Vitamin K2-7     zaleplon  (SONATA ) 5 MG capsule 1 at night if needed for sleep (Patient not taking: Reported on 09/26/2024) 30 capsule 5   No current facility-administered medications for this visit.     Past Medical History:  Diagnosis Date   Allergy    Asthma    GERD (gastroesophageal reflux disease)    Hyperlipidemia    Hypertension    Nervous disorder     Past Surgical History:  Procedure Laterality Date   ABDOMINAL HYSTERECTOMY     RIGHT/LEFT HEART CATH AND CORONARY ANGIOGRAPHY N/A 09/26/2024   Procedure: RIGHT/LEFT HEART CATH AND CORONARY ANGIOGRAPHY;  Surgeon: Mady Bruckner, MD;  Location: [facility];  Service: Cardiovascular;  Laterality: N/A;   TONSILLECTOMY      Social History   Socioeconomic History   Marital status: Married    Spouse name: Not on file   Number  of children: 3   Years of education: Not on file   Highest education level: Not on file  Occupational History   Occupation: retired  Tobacco Use   Smoking status: Former    Types: Cigarettes   Smokeless tobacco: Never  Substance and Sexual Activity   Alcohol use: Yes    Comment: rare-wine   Drug use: No   Sexual activity: Not on file  Other Topics Concern   Not on file  Social History Narrative   Lives with husband   Social Drivers of Health   Financial Resource Strain: Not on file  Food Insecurity: No Food Insecurity (09/26/2024)   Hunger Vital Sign    Worried About Running Out of Food in the Last Year: Never true    Ran Out of Food in the Last  Year: Never true  Transportation Needs: No Transportation Needs (09/26/2024)   PRAPARE - Administrator, Civil Service (Medical): No    Lack of Transportation (Non-Medical): No  Physical Activity: Not on file  Stress: Not on file  Social Connections: Unknown (09/26/2024)   Social Connection and Isolation Panel    Frequency of Communication with Friends and Family: Not on file    Frequency of Social Gatherings with Friends and Family: Not on file    Attends Religious Services: 1 to 4 times per year    Active Member of [employer] or Organizations: Not on file    Attends Banker Meetings: Never    Marital Status: Married  Catering Manager Violence: Unknown (09/26/2024)   Humiliation, Afraid, Rape, and Kick questionnaire    Fear of Current or Ex-Partner: No    Emotionally Abused: No    Physically Abused: Not on file    Sexually Abused: No    Family History  Problem Relation Age of Onset   Lung cancer Mother    Asthma Mother    Allergies Mother    Cancer - Other Other    Ovarian cancer Other    Breast cancer Neg Hx     ROS: no fevers or chills, productive cough, hemoptysis, dysphasia, odynophagia, melena, hematochezia, dysuria, hematuria, rash, seizure activity, orthopnea, PND, pedal edema, claudication. Remaining systems are negative.  Physical Exam: Well-developed well-nourished in no acute distress.  Skin is warm and dry.  HEENT is normal.  Neck is supple.  Chest is clear to auscultation with normal expansion.  Cardiovascular exam is regular rate and rhythm.  Abdominal exam nontender or distended. No masses palpated. Extremities show no edema. neuro grossly intact  ECG- personally reviewed  A/P  1 recent admission with Takotsubo cardiomyopathy-continue Toprol .  Repeat echocardiogram in approximately 4 weeks.  Note ARB/Entresto held in the hospital due to hypotension.  2 Coronary artery disease-70% distal circumflex.  Continue aspirin  and  statin.  3 hyperlipidemia-  4 anxiety-follow-up primary care.  Redell Shallow, MD

## 2024-10-15 DIAGNOSIS — J3089 Other allergic rhinitis: Secondary | ICD-10-CM | POA: Diagnosis not present

## 2024-10-15 DIAGNOSIS — J301 Allergic rhinitis due to pollen: Secondary | ICD-10-CM | POA: Diagnosis not present

## 2024-10-15 DIAGNOSIS — J3081 Allergic rhinitis due to animal (cat) (dog) hair and dander: Secondary | ICD-10-CM | POA: Diagnosis not present

## 2024-10-17 ENCOUNTER — Ambulatory Visit: Attending: Internal Medicine | Admitting: Cardiology

## 2024-10-17 ENCOUNTER — Encounter: Payer: Self-pay | Admitting: Cardiology

## 2024-10-17 VITALS — BP 165/77 | HR 65 | Ht 65.0 in | Wt 154.5 lb

## 2024-10-17 DIAGNOSIS — I214 Non-ST elevation (NSTEMI) myocardial infarction: Secondary | ICD-10-CM | POA: Diagnosis not present

## 2024-10-17 DIAGNOSIS — E7800 Pure hypercholesterolemia, unspecified: Secondary | ICD-10-CM

## 2024-10-17 DIAGNOSIS — I2510 Atherosclerotic heart disease of native coronary artery without angina pectoris: Secondary | ICD-10-CM

## 2024-10-17 DIAGNOSIS — I5181 Takotsubo syndrome: Secondary | ICD-10-CM | POA: Diagnosis not present

## 2024-10-17 MED ORDER — LOSARTAN POTASSIUM 25 MG PO TABS: 25.0000 mg | ORAL_TABLET | Freq: Every day | ORAL | 3 refills | Status: AC

## 2024-10-17 NOTE — Addendum Note (Signed)
 Addended by: RICHIE ADRIEN ORN on: 10/17/2024 10:13 AM   Modules accepted: Orders

## 2024-10-17 NOTE — Patient Instructions (Signed)
 Medication Instructions:   START LOSARTAN 25 MG ONCE DAILY  *If you need a refill on your cardiac medications before your next appointment, please call your pharmacy*  Lab Work:  Your physician recommends that you return for lab work in: 1 WEEKS-DO NOT NEED TO FAST  Your physician recommends that you return for lab work in: 6 [facility]  If you have labs (blood work) drawn today and your tests are completely normal, you will receive your results only by: MyChart Message (if you have MyChart) OR A paper copy in the mail If you have any lab test that is abnormal or we need to change your treatment, we will call you to review the results.  Testing/Procedures:  Your physician has requested that you have an echocardiogram. Echocardiography is a painless test that uses sound waves to create images of your heart. It provides your doctor with information about the size and shape of your heart and how well your hearts chambers and valves are working. This procedure takes approximately one hour. There are no restrictions for this procedure. Please do NOT wear cologne, perfume, aftershave, or lotions (deodorant is allowed). Please arrive 15 minutes prior to your appointment time.  Please note: We ask at that you not bring children with you during ultrasound (echo/ vascular) testing. Due to room size and safety concerns, children are not allowed in the ultrasound rooms during exams. Our front office staff cannot provide observation of children in our lobby area while testing is being conducted. An adult accompanying a patient to their appointment will only be allowed in the ultrasound room at the discretion of the ultrasound technician under special circumstances. We apologize for any inconvenience. [address]  Follow-Up: At [facility], you and your health needs are our priority.  As part of our continuing mission to provide you with exceptional heart care, our providers are all  part of one team.  This team includes your primary Cardiologist (physician) and Advanced Practice Providers or APPs (Physician Assistants and Nurse Practitioners) who all work together to provide you with the care you need, when you need it.  Your next appointment:   4-6 month(s)  Provider:   Redell Shallow, MD

## 2024-10-22 DIAGNOSIS — J3089 Other allergic rhinitis: Secondary | ICD-10-CM | POA: Diagnosis not present

## 2024-10-22 DIAGNOSIS — J3081 Allergic rhinitis due to animal (cat) (dog) hair and dander: Secondary | ICD-10-CM | POA: Diagnosis not present

## 2024-10-22 DIAGNOSIS — J301 Allergic rhinitis due to pollen: Secondary | ICD-10-CM | POA: Diagnosis not present

## 2024-10-23 DIAGNOSIS — M9901 Segmental and somatic dysfunction of cervical region: Secondary | ICD-10-CM | POA: Diagnosis not present

## 2024-10-23 DIAGNOSIS — M5031 Other cervical disc degeneration,  high cervical region: Secondary | ICD-10-CM | POA: Diagnosis not present

## 2024-10-28 DIAGNOSIS — I5181 Takotsubo syndrome: Secondary | ICD-10-CM | POA: Diagnosis not present

## 2024-10-28 LAB — BASIC METABOLIC PANEL WITH GFR
BUN/Creatinine Ratio: 17 (ref 12–28)
BUN: 16 mg/dL (ref 8–27)
CO2: 23 mmol/L (ref 20–29)
Calcium: 9.5 mg/dL (ref 8.7–10.3)
Chloride: 102 mmol/L (ref 96–106)
Creatinine, Ser: 0.95 mg/dL (ref 0.57–1.00)
Glucose: 97 mg/dL (ref 70–99)
Potassium: 4.3 mmol/L (ref 3.5–5.2)
Sodium: 140 mmol/L (ref 134–144)
eGFR: 61 mL/min/1.73

## 2024-10-29 ENCOUNTER — Ambulatory Visit: Payer: Self-pay | Admitting: Cardiology

## 2024-11-01 ENCOUNTER — Telehealth: Payer: Self-pay | Admitting: Cardiology

## 2024-11-01 DIAGNOSIS — I5181 Takotsubo syndrome: Secondary | ICD-10-CM

## 2024-11-01 MED ORDER — FUROSEMIDE 20 MG PO TABS: 20.0000 mg | ORAL_TABLET | Freq: Every day | ORAL | 1 refills | Status: DC

## 2024-11-01 MED ORDER — POTASSIUM CHLORIDE CRYS ER 10 MEQ PO TBCR: 10.0000 meq | EXTENDED_RELEASE_TABLET | Freq: Every day | ORAL | 1 refills | Status: DC

## 2024-11-01 NOTE — Telephone Encounter (Signed)
 Called and spoke to pt. Her main c/o is: excessive swelling at the ankles/feet. Right foot swelling a bit greater than Left foot. No redness/no warmness/no pain. She is not able to wear her shoes and has to wear slippers. She normally has a small amt of swelling, but this is excessive. She is also c/o pinching feeling to left side of chest, under her breast. Denies SOB/DOE, denies dizziness/lightheadedness. Denies palpitations. For the last 3-4 days, she also is having increased amount of bubbles in the urine when she voids in the toilet. No other urine problems (color is WNL; denies pain/burning with urination).   BP 172/71; Hr 56 Rechecked after one minute,  BP 161/77  Cardiac Meds:  In AM -Aspirin  81 mg -Metoprolol  succinate 25 mg  -Rosuvastatin  10 mg  In PM -Losartan  25 mg  If meds prescribed, please send to: [id number] - [place], [place] - [address]

## 2024-11-01 NOTE — Telephone Encounter (Signed)
 Pt c/o swelling/edema: STAT if pt has developed SOB within 24 hours  If swelling, where is the swelling located?   Both ankles  How much weight have you gained and in what time span?   No  Have you gained 2 pounds in a day or 5 pounds in a week?   No  Do you have a log of your daily weights (if so, list)?   No  Are you currently taking a fluid pill?  No  Are you currently SOB?   No  Have you traveled recently in a car or plane for an extended period of time?   Patient traveled for 2.5 hour trip, but took frequent breaks.  Patient is concerned her ankles have been swelling and she has been having bubbles in her urine.  Patient is concerned this may be related to her medication.

## 2024-11-01 NOTE — Telephone Encounter (Signed)
 Called and spoke to pt. Advised, per Dr. Barbaraann, to start Lasix  and potassium supplement daily. Advised that she should see increased urine output and decrease in swelling. If she does not see this, she should get back in touch with us .   She does have another concern. Since starting Toprol -XL about one month ago, she has continued to feel sluggish. She did see improvement from the first week of being on this medication, but now continues to not feel as active. She does not drive as her response time/reflexes are not as sharp and attributes it to this medication. Will route to primary for further rec's.

## 2024-11-06 NOTE — Telephone Encounter (Signed)
 Pietro Redell RAMAN, MD to Richie Adrien ORN, RN  (Selected Message)     11/03/24  2:15 PM Discontinue Toprol . Check potassium and renal function one week after recent addition of Lasix . BC.  Spoke with pt's husband, Gerre regarding Dr. Vertie recommendations. Pt will stop taking metoprolol  succinate and have labs drawn in the next few days to assess renal function and electrolytes. Gerre verbalizes understanding and will provide this information to his wife. No further questions at this time.

## 2024-11-08 MED ORDER — ROSUVASTATIN CALCIUM 20 MG PO TABS: 20.0000 mg | ORAL_TABLET | Freq: Every day | ORAL | 3 refills | Status: AC

## 2024-11-08 NOTE — Telephone Encounter (Signed)
 Per Adrien, RN: if the medication was not started then no need for labs.  Notified patient she does not need to have BMET drawn.   Patient then also asked if instead of increasing Crestor  from 10 mg to 40 mg could she try 20 mg?  She states she already has leg cramps up the back of her legs and feels that she cannot tolerate 40 mg of Crestor , but is willing to try 20 mg. She states she will need a refill soon, and to go ahead [facility] if OK to increase to 20 mg, or refill 10 mg.  Patient also states she was not aware of when her follow-up was planned for. Informed her it was in 4-6 months. She asked to be scheduled, provider schedule is currently on hold for review. She would like Adrien to follow-up with her about this and cardiac rehab referral.

## 2024-11-08 NOTE — Addendum Note (Signed)
 Addended by: VICCI ROXIE CROME on: 11/08/2024 08:56 AM   Modules accepted: Orders

## 2024-11-08 NOTE — Telephone Encounter (Signed)
 Patient states she never started Lasix  or potassium and requests removal from medication list.  Patient states she believes swelling occurred due to being on feet frequently and eating more sodium over the holidays. She is back on her regular diet and elevating her feet with improvement in swelling.  She plans to have BMET drawn this morning, but would like to know if still needed if she did not start Lasix  or potassium. Will forward to Dr. Pietro to review.  Patient would also like an update on Cardiac Rehab. She has not heard from anyone. It appears the referral has been closed. Will also forward to Dr. Vertie nurse to follow-up with patient regarding this.  Patient states you may leave her a detailed message if she does not answer the phone.

## 2024-11-08 NOTE — Telephone Encounter (Signed)
 Patient will be going to the lab today and wanted to verify the correct orders are in. Please advise

## 2024-11-08 NOTE — Telephone Encounter (Signed)
 Left message for patient, okay to increase to 20 mg once daily for now. Repeat labs in 8 weeks. New script sent to the pharmacy

## 2024-11-08 NOTE — Addendum Note (Signed)
 Addended by: RICHIE ADRIEN ORN on: 11/08/2024 01:58 PM   Modules accepted: Orders

## 2024-11-18 ENCOUNTER — Telehealth: Payer: Self-pay | Admitting: Cardiology

## 2024-11-18 NOTE — Telephone Encounter (Signed)
 Pt calling to f/u on Exercise and Diet program that was dicussed previously. Pt would like to sign up. Please advise

## 2024-11-18 NOTE — Telephone Encounter (Signed)
 Left detailed message for patient of cardiac rehab response.

## 2024-11-18 NOTE — Telephone Encounter (Signed)
 Pt very concerned about Cardiac Rehab Referral. She states she has been waiting two months and is not sure what has happened.   Per chart review, referral was entered at [facility] with Pietro, MD on 10/17/24 but shows as canceled.

## 2024-11-19 ENCOUNTER — Telehealth (HOSPITAL_COMMUNITY): Payer: Self-pay

## 2024-11-19 NOTE — Telephone Encounter (Signed)
 Patient called and wanted to get scheduled for cardiac Rehab.  Advised that We would verify insurance and call her when February schedule open up.

## 2024-11-20 ENCOUNTER — Telehealth: Payer: Self-pay

## 2024-11-20 NOTE — Telephone Encounter (Signed)
 Copied from CRM #[id number]. Topic: Clinical - Medication Question >> Nov 18, 2024 12:00 PM Devaughn RAMAN wrote: Reason for CRM: Pt calling in regards to  renewal for clonazepam  medication. Pt used to see Dr.Young and medication is good until the end of March, pt stated the pharmacy advised her the medication will need to be renewed by a new provider, pt would like a callback regarding this.    Please advise as Dr Neysa has retired.

## 2024-11-25 ENCOUNTER — Telehealth (HOSPITAL_COMMUNITY): Payer: Self-pay

## 2024-11-25 NOTE — Telephone Encounter (Signed)
 Pt insurance is active and benefits verified through [facility] Co-pay $20, DED $0/$0 met, out of pocket $5,900/$26.83 met, co-insurance 0%. No pre-authorization required. 11/25/2024 @ 9:54am, spoke with Norleen, REF# [id number].  TCR/ICR? ICR Visit(date of service)limitation? No Can multiple codes be used on the same date of service/visit?(IF ITS A LIMIT) N/A  Is this a lifetime maximum or an annual maximum? Annual Has the member used any of these services to date? No Is there a time limit (weeks/months) on start of program and/or program completion? No

## 2024-11-25 NOTE — Telephone Encounter (Signed)
 Patient called back to get scheduled in the Cardiac Rehab Program. Patient will come in for orientation on 2/03 and will attend the 12:30 exercise class.  Pensions consultant.

## 2024-11-28 ENCOUNTER — Ambulatory Visit (HOSPITAL_COMMUNITY)
Admission: RE | Admit: 2024-11-28 | Discharge: 2024-11-28 | Disposition: A | Discharge status: Home / Self Care | Source: Ambulatory Visit | Attending: Cardiology | Admitting: Cardiology

## 2024-11-28 DIAGNOSIS — I5181 Takotsubo syndrome: Secondary | ICD-10-CM | POA: Diagnosis present

## 2024-11-28 LAB — ECHOCARDIOGRAM COMPLETE
Area-P 1/2: 5.93 cm2
S' Lateral: 2.6 cm

## 2024-11-29 ENCOUNTER — Other Ambulatory Visit: Payer: Self-pay | Admitting: Pulmonary Disease

## 2024-11-29 MED ORDER — CLONAZEPAM 0.5 MG PO TABS: 0.5000 mg | ORAL_TABLET | Freq: Every day | ORAL | 0 refills | Status: AC

## 2024-12-10 ENCOUNTER — Encounter (HOSPITAL_COMMUNITY)
Admission: RE | Admit: 2024-12-10 | Discharge: 2024-12-10 | Disposition: A | Source: Ambulatory Visit | Attending: Cardiology

## 2024-12-10 VITALS — BP 132/72 | HR 73 | Ht 66.5 in | Wt 155.4 lb

## 2024-12-10 DIAGNOSIS — I214 Non-ST elevation (NSTEMI) myocardial infarction: Secondary | ICD-10-CM

## 2024-12-10 LAB — LIPID PANEL
Chol/HDL Ratio: 1.9 ratio (ref 0.0–4.4)
Cholesterol, Total: 170 mg/dL (ref 100–199)
HDL: 88 mg/dL
LDL Chol Calc (NIH): 68 mg/dL (ref 0–99)
Triglycerides: 74 mg/dL (ref 0–149)
VLDL Cholesterol Cal: 14 mg/dL (ref 5–40)

## 2024-12-10 LAB — HEPATIC FUNCTION PANEL
ALT: 23 [IU]/L (ref 0–32)
AST: 32 [IU]/L (ref 0–40)
Albumin: 4.5 g/dL (ref 3.8–4.8)
Alkaline Phosphatase: 115 [IU]/L (ref 49–135)
Bilirubin Total: 0.7 mg/dL (ref 0.0–1.2)
Bilirubin, Direct: 0.26 mg/dL (ref 0.00–0.40)
Total Protein: 7.2 g/dL (ref 6.0–8.5)

## 2024-12-16 ENCOUNTER — Encounter (HOSPITAL_COMMUNITY)

## 2024-12-18 ENCOUNTER — Encounter (HOSPITAL_COMMUNITY)

## 2024-12-20 ENCOUNTER — Encounter (HOSPITAL_COMMUNITY)

## 2024-12-23 ENCOUNTER — Encounter (HOSPITAL_COMMUNITY)

## 2024-12-25 ENCOUNTER — Encounter (HOSPITAL_COMMUNITY)

## 2024-12-26 ENCOUNTER — Ambulatory Visit: Admitting: Podiatry

## 2024-12-27 ENCOUNTER — Encounter (HOSPITAL_COMMUNITY)

## 2024-12-30 ENCOUNTER — Encounter (HOSPITAL_COMMUNITY)

## 2025-01-01 ENCOUNTER — Encounter (HOSPITAL_COMMUNITY)

## 2025-01-03 ENCOUNTER — Encounter (HOSPITAL_COMMUNITY)

## 2025-01-06 ENCOUNTER — Encounter (HOSPITAL_COMMUNITY)

## 2025-01-08 ENCOUNTER — Encounter (HOSPITAL_COMMUNITY)

## 2025-01-10 ENCOUNTER — Encounter (HOSPITAL_COMMUNITY)

## 2025-01-13 ENCOUNTER — Encounter (HOSPITAL_COMMUNITY)

## 2025-01-15 ENCOUNTER — Encounter (HOSPITAL_COMMUNITY)

## 2025-01-17 ENCOUNTER — Encounter (HOSPITAL_COMMUNITY)

## 2025-01-20 ENCOUNTER — Encounter (HOSPITAL_COMMUNITY)

## 2025-01-22 ENCOUNTER — Encounter (HOSPITAL_COMMUNITY)

## 2025-01-24 ENCOUNTER — Encounter (HOSPITAL_COMMUNITY)

## 2025-01-27 ENCOUNTER — Encounter (HOSPITAL_COMMUNITY)

## 2025-01-29 ENCOUNTER — Encounter (HOSPITAL_COMMUNITY)

## 2025-01-31 ENCOUNTER — Encounter (HOSPITAL_COMMUNITY)

## 2025-02-03 ENCOUNTER — Encounter (HOSPITAL_COMMUNITY)

## 2025-02-05 ENCOUNTER — Encounter (HOSPITAL_COMMUNITY)

## 2025-02-07 ENCOUNTER — Encounter (HOSPITAL_COMMUNITY)

## 2025-02-10 ENCOUNTER — Encounter (HOSPITAL_COMMUNITY)

## 2025-02-12 ENCOUNTER — Encounter (HOSPITAL_COMMUNITY)

## 2025-02-14 ENCOUNTER — Encounter (HOSPITAL_COMMUNITY)

## 2025-02-17 ENCOUNTER — Encounter (HOSPITAL_COMMUNITY)

## 2025-02-19 ENCOUNTER — Encounter (HOSPITAL_COMMUNITY)

## 2025-02-21 ENCOUNTER — Encounter (HOSPITAL_COMMUNITY)

## 2025-02-24 ENCOUNTER — Encounter (HOSPITAL_COMMUNITY)

## 2025-02-26 ENCOUNTER — Encounter (HOSPITAL_COMMUNITY)

## 2025-02-27 ENCOUNTER — Encounter: Admitting: Pulmonary Disease

## 2025-02-28 ENCOUNTER — Encounter (HOSPITAL_COMMUNITY)

## 2025-03-03 ENCOUNTER — Encounter (HOSPITAL_COMMUNITY)

## 2025-03-05 ENCOUNTER — Encounter (HOSPITAL_COMMUNITY)

## 2025-03-25 ENCOUNTER — Ambulatory Visit: Admitting: Cardiology
